# Patient Record
Sex: Male | Born: 1995 | Race: White | Hispanic: No | Marital: Single | Smoking: Never smoker
Health system: Southern US, Community
[De-identification: ages and names within clinical notes are randomized; demographics above are authoritative.]

## PROBLEM LIST (undated history)

## (undated) DIAGNOSIS — J302 Other seasonal allergic rhinitis: Secondary | ICD-10-CM

## (undated) DIAGNOSIS — R7309 Other abnormal glucose: Secondary | ICD-10-CM

## (undated) DIAGNOSIS — E119 Type 2 diabetes mellitus without complications: Secondary | ICD-10-CM

## (undated) DIAGNOSIS — J45909 Unspecified asthma, uncomplicated: Secondary | ICD-10-CM

## (undated) DIAGNOSIS — H269 Unspecified cataract: Secondary | ICD-10-CM

## (undated) HISTORY — DX: Type 2 diabetes mellitus without complications: E11.9

## (undated) HISTORY — DX: Other seasonal allergic rhinitis: J30.2

## (undated) HISTORY — PX: CATARACT EXTRACTION: SUR2

## (undated) HISTORY — DX: Unspecified cataract: H26.9

---
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
## 2015-12-28 DIAGNOSIS — H269 Unspecified cataract: Secondary | ICD-10-CM

## 2015-12-28 HISTORY — DX: Unspecified cataract: H26.9

## 2016-01-07 ENCOUNTER — Inpatient Hospital Stay (HOSPITAL_COMMUNITY): Payer: Medicaid Other

## 2016-01-07 ENCOUNTER — Encounter (HOSPITAL_COMMUNITY)
Admission: EM | Disposition: A | Discharge status: Home Health | Payer: Self-pay | Source: Home / Self Care | Attending: Pulmonary Disease

## 2016-01-07 ENCOUNTER — Encounter (HOSPITAL_COMMUNITY): Payer: Self-pay | Admitting: *Deleted

## 2016-01-07 ENCOUNTER — Emergency Department (HOSPITAL_COMMUNITY): Payer: Medicaid Other | Admitting: Certified Registered Nurse Anesthetist

## 2016-01-07 ENCOUNTER — Inpatient Hospital Stay (HOSPITAL_COMMUNITY)
Admission: EM | Admit: 2016-01-07 | Discharge: 2016-01-15 | DRG: 853 | Disposition: A | Discharge status: Home Health | Payer: Medicaid Other | Attending: Internal Medicine | Admitting: Internal Medicine

## 2016-01-07 ENCOUNTER — Emergency Department (HOSPITAL_COMMUNITY): Payer: Medicaid Other

## 2016-01-07 DIAGNOSIS — J9602 Acute respiratory failure with hypercapnia: Secondary | ICD-10-CM

## 2016-01-07 DIAGNOSIS — J45909 Unspecified asthma, uncomplicated: Secondary | ICD-10-CM | POA: Diagnosis present

## 2016-01-07 DIAGNOSIS — A419 Sepsis, unspecified organism: Secondary | ICD-10-CM | POA: Diagnosis present

## 2016-01-07 DIAGNOSIS — N493 Fournier gangrene: Secondary | ICD-10-CM | POA: Diagnosis not present

## 2016-01-07 DIAGNOSIS — E871 Hypo-osmolality and hyponatremia: Secondary | ICD-10-CM | POA: Diagnosis present

## 2016-01-07 DIAGNOSIS — Z596 Low income: Secondary | ICD-10-CM | POA: Diagnosis not present

## 2016-01-07 DIAGNOSIS — E876 Hypokalemia: Secondary | ICD-10-CM | POA: Diagnosis not present

## 2016-01-07 DIAGNOSIS — Z794 Long term (current) use of insulin: Secondary | ICD-10-CM | POA: Diagnosis not present

## 2016-01-07 DIAGNOSIS — T4275XA Adverse effect of unspecified antiepileptic and sedative-hypnotic drugs, initial encounter: Secondary | ICD-10-CM | POA: Diagnosis present

## 2016-01-07 DIAGNOSIS — E1310 Other specified diabetes mellitus with ketoacidosis without coma: Secondary | ICD-10-CM | POA: Diagnosis present

## 2016-01-07 DIAGNOSIS — E119 Type 2 diabetes mellitus without complications: Secondary | ICD-10-CM | POA: Diagnosis not present

## 2016-01-07 DIAGNOSIS — N179 Acute kidney failure, unspecified: Secondary | ICD-10-CM | POA: Diagnosis present

## 2016-01-07 DIAGNOSIS — E872 Acidosis: Secondary | ICD-10-CM

## 2016-01-07 DIAGNOSIS — D649 Anemia, unspecified: Secondary | ICD-10-CM | POA: Diagnosis not present

## 2016-01-07 DIAGNOSIS — J9600 Acute respiratory failure, unspecified whether with hypoxia or hypercapnia: Secondary | ICD-10-CM | POA: Diagnosis not present

## 2016-01-07 DIAGNOSIS — Z880 Allergy status to penicillin: Secondary | ICD-10-CM | POA: Diagnosis not present

## 2016-01-07 DIAGNOSIS — E878 Other disorders of electrolyte and fluid balance, not elsewhere classified: Secondary | ICD-10-CM | POA: Diagnosis not present

## 2016-01-07 DIAGNOSIS — E1010 Type 1 diabetes mellitus with ketoacidosis without coma: Secondary | ICD-10-CM | POA: Diagnosis not present

## 2016-01-07 DIAGNOSIS — Z4800 Encounter for change or removal of nonsurgical wound dressing: Secondary | ICD-10-CM | POA: Diagnosis present

## 2016-01-07 DIAGNOSIS — I959 Hypotension, unspecified: Secondary | ICD-10-CM | POA: Diagnosis present

## 2016-01-07 DIAGNOSIS — Z9289 Personal history of other medical treatment: Secondary | ICD-10-CM

## 2016-01-07 DIAGNOSIS — J9690 Respiratory failure, unspecified, unspecified whether with hypoxia or hypercapnia: Secondary | ICD-10-CM

## 2016-01-07 DIAGNOSIS — K611 Rectal abscess: Secondary | ICD-10-CM | POA: Diagnosis present

## 2016-01-07 DIAGNOSIS — G9340 Encephalopathy, unspecified: Secondary | ICD-10-CM | POA: Diagnosis present

## 2016-01-07 DIAGNOSIS — J9601 Acute respiratory failure with hypoxia: Secondary | ICD-10-CM | POA: Diagnosis not present

## 2016-01-07 DIAGNOSIS — Z959 Presence of cardiac and vascular implant and graft, unspecified: Secondary | ICD-10-CM | POA: Diagnosis not present

## 2016-01-07 DIAGNOSIS — B9620 Unspecified Escherichia coli [E. coli] as the cause of diseases classified elsewhere: Secondary | ICD-10-CM | POA: Diagnosis present

## 2016-01-07 DIAGNOSIS — Z9114 Patient's other noncompliance with medication regimen: Secondary | ICD-10-CM | POA: Diagnosis not present

## 2016-01-07 DIAGNOSIS — Z9889 Other specified postprocedural states: Secondary | ICD-10-CM | POA: Diagnosis not present

## 2016-01-07 DIAGNOSIS — E1165 Type 2 diabetes mellitus with hyperglycemia: Secondary | ICD-10-CM | POA: Diagnosis not present

## 2016-01-07 DIAGNOSIS — N492 Inflammatory disorders of scrotum: Secondary | ICD-10-CM | POA: Diagnosis present

## 2016-01-07 DIAGNOSIS — L02215 Cutaneous abscess of perineum: Secondary | ICD-10-CM | POA: Diagnosis present

## 2016-01-07 DIAGNOSIS — R509 Fever, unspecified: Secondary | ICD-10-CM | POA: Diagnosis present

## 2016-01-07 DIAGNOSIS — E875 Hyperkalemia: Secondary | ICD-10-CM | POA: Diagnosis not present

## 2016-01-07 DIAGNOSIS — R0689 Other abnormalities of breathing: Secondary | ICD-10-CM

## 2016-01-07 DIAGNOSIS — Z79899 Other long term (current) drug therapy: Secondary | ICD-10-CM | POA: Diagnosis not present

## 2016-01-07 HISTORY — PX: INCISION AND DRAINAGE PERIRECTAL ABSCESS: SHX1804

## 2016-01-07 LAB — CBC
HEMATOCRIT: 48 % (ref 39.0–52.0)
HEMOGLOBIN: 16.5 g/dL (ref 13.0–17.0)
MCH: 27.8 pg (ref 26.0–34.0)
MCHC: 34.4 g/dL (ref 30.0–36.0)
MCV: 80.9 fL (ref 78.0–100.0)
Platelets: 278 10*3/uL (ref 150–400)
RBC: 5.93 MIL/uL — ABNORMAL HIGH (ref 4.22–5.81)
RDW: 13.5 % (ref 11.5–15.5)
WBC: 25.1 10*3/uL — AB (ref 4.0–10.5)

## 2016-01-07 LAB — POCT I-STAT 3, ART BLOOD GAS (G3+)
ACID-BASE DEFICIT: 20 mmol/L — AB (ref 0.0–2.0)
Acid-base deficit: 12 mmol/L — ABNORMAL HIGH (ref 0.0–2.0)
Bicarbonate: 9.7 mEq/L — ABNORMAL LOW (ref 20.0–24.0)
Bicarbonate: 12.9 mEq/L — ABNORMAL LOW (ref 20.0–24.0)
O2 SAT: 99 %
O2 SAT: 100 %
PCO2 ART: 26.6 mmHg — AB (ref 35.0–45.0)
PH ART: 7.028 — AB (ref 7.350–7.450)
PO2 ART: 189 mmHg — AB (ref 80.0–100.0)
Patient temperature: 98
TCO2: 11 mmol/L (ref 0–100)
TCO2: 14 mmol/L (ref 0–100)
pCO2 arterial: 36.8 mmHg (ref 35.0–45.0)
pH, Arterial: 7.294 — ABNORMAL LOW (ref 7.350–7.450)
pO2, Arterial: 239 mmHg — ABNORMAL HIGH (ref 80.0–100.0)

## 2016-01-07 LAB — POCT I-STAT 7, (LYTES, BLD GAS, ICA,H+H)
ACID-BASE DEFICIT: 16 mmol/L — AB (ref 0.0–2.0)
ACID-BASE DEFICIT: 17 mmol/L — AB (ref 0.0–2.0)
Acid-base deficit: 19 mmol/L — ABNORMAL HIGH (ref 0.0–2.0)
Bicarbonate: 8.5 mEq/L — ABNORMAL LOW (ref 20.0–24.0)
Bicarbonate: 10.4 mEq/L — ABNORMAL LOW (ref 20.0–24.0)
Bicarbonate: 10.8 mEq/L — ABNORMAL LOW (ref 20.0–24.0)
CALCIUM ION: 1.17 mmol/L (ref 1.12–1.23)
Calcium, Ion: 1.33 mmol/L — ABNORMAL HIGH (ref 1.12–1.23)
Calcium, Ion: 1.17 mmol/L (ref 1.12–1.23)
HCT: 45 % (ref 39.0–52.0)
HCT: 36 % — ABNORMAL LOW (ref 39.0–52.0)
HEMATOCRIT: 36 % — AB (ref 39.0–52.0)
HEMOGLOBIN: 15.3 g/dL (ref 13.0–17.0)
HEMOGLOBIN: 12.2 g/dL — AB (ref 13.0–17.0)
HEMOGLOBIN: 12.2 g/dL — AB (ref 13.0–17.0)
O2 SAT: 100 %
O2 Saturation: 99 %
O2 Saturation: 100 %
PCO2 ART: 25.2 mmHg — AB (ref 35.0–45.0)
PH ART: 7.175 — AB (ref 7.350–7.450)
PO2 ART: 550 mmHg — AB (ref 80.0–100.0)
POTASSIUM: 2.5 mmol/L — AB (ref 3.5–5.1)
POTASSIUM: 2.2 mmol/L — AB (ref 3.5–5.1)
POTASSIUM: 3.3 mmol/L — AB (ref 3.5–5.1)
SODIUM: 136 mmol/L (ref 135–145)
Sodium: 133 mmol/L — ABNORMAL LOW (ref 135–145)
Sodium: 137 mmol/L (ref 135–145)
TCO2: 9 mmol/L (ref 0–100)
TCO2: 11 mmol/L (ref 0–100)
TCO2: 12 mmol/L (ref 0–100)
pCO2 arterial: 28.3 mmHg — ABNORMAL LOW (ref 35.0–45.0)
pCO2 arterial: 29.6 mmHg — ABNORMAL LOW (ref 35.0–45.0)
pH, Arterial: 7.137 — CL (ref 7.350–7.450)
pH, Arterial: 7.173 — CL (ref 7.350–7.450)
pO2, Arterial: 198 mmHg — ABNORMAL HIGH (ref 80.0–100.0)
pO2, Arterial: 463 mmHg — ABNORMAL HIGH (ref 80.0–100.0)

## 2016-01-07 LAB — BASIC METABOLIC PANEL
ANION GAP: 14 (ref 5–15)
BUN: 11 mg/dL (ref 6–20)
CO2: 11 mmol/L — ABNORMAL LOW (ref 22–32)
Calcium: 7.9 mg/dL — ABNORMAL LOW (ref 8.9–10.3)
Chloride: 109 mmol/L (ref 101–111)
Creatinine, Ser: 1.07 mg/dL (ref 0.61–1.24)
GFR calc Af Amer: >60 mL/min (ref >60)
GLUCOSE: 284 mg/dL — AB (ref 65–99)
POTASSIUM: 3.2 mmol/L — AB (ref 3.5–5.1)
Sodium: 134 mmol/L — ABNORMAL LOW (ref 135–145)

## 2016-01-07 LAB — LACTIC ACID, PLASMA
LACTIC ACID, VENOUS: 1 mmol/L (ref 0.5–2.0)
Lactic Acid, Venous: 0.7 mmol/L (ref 0.5–2.0)

## 2016-01-07 LAB — COMPREHENSIVE METABOLIC PANEL
ALT: 23 U/L (ref 17–63)
AST: 14 U/L — ABNORMAL LOW (ref 15–41)
Albumin: 3.2 g/dL — ABNORMAL LOW (ref 3.5–5.0)
Alkaline Phosphatase: 149 U/L — ABNORMAL HIGH (ref 38–126)
Anion gap: 24 — ABNORMAL HIGH (ref 5–15)
BUN: 13 mg/dL (ref 6–20)
CHLORIDE: 93 mmol/L — AB (ref 101–111)
CO2: 9 mmol/L — AB (ref 22–32)
CREATININE: 1.73 mg/dL — AB (ref 0.61–1.24)
Calcium: 9.6 mg/dL (ref 8.9–10.3)
GFR calc non Af Amer: 55 mL/min — ABNORMAL LOW (ref >60)
Glucose, Bld: 569 mg/dL (ref 65–99)
Potassium: 3.9 mmol/L (ref 3.5–5.1)
SODIUM: 126 mmol/L — AB (ref 135–145)
Total Bilirubin: 1.8 mg/dL — ABNORMAL HIGH (ref 0.3–1.2)
Total Protein: 8 g/dL (ref 6.5–8.1)

## 2016-01-07 LAB — POCT I-STAT, CHEM 8
BUN: 15 mg/dL (ref 6–20)
CHLORIDE: 107 mmol/L (ref 101–111)
Calcium, Ion: 1.27 mmol/L — ABNORMAL HIGH (ref 1.12–1.23)
Creatinine, Ser: 0.6 mg/dL — ABNORMAL LOW (ref 0.61–1.24)
Glucose, Bld: 519 mg/dL — ABNORMAL HIGH (ref 65–99)
HCT: 49 % (ref 39.0–52.0)
Hemoglobin: 16.7 g/dL (ref 13.0–17.0)
POTASSIUM: 2.5 mmol/L — AB (ref 3.5–5.1)
SODIUM: 134 mmol/L — AB (ref 135–145)
TCO2: 10 mmol/L (ref 0–100)

## 2016-01-07 LAB — TRIGLYCERIDES: TRIGLYCERIDES: 860 mg/dL — AB (ref <150)

## 2016-01-07 LAB — TROPONIN I: TROPONIN I: 0.03 ng/mL (ref <0.031)

## 2016-01-07 LAB — POCT I-STAT 4, (NA,K, GLUC, HGB,HCT)
Glucose, Bld: 349 mg/dL — ABNORMAL HIGH (ref 65–99)
HCT: 38 % — ABNORMAL LOW (ref 39.0–52.0)
HEMOGLOBIN: 12.9 g/dL — AB (ref 13.0–17.0)
Potassium: 2.4 mmol/L — CL (ref 3.5–5.1)
SODIUM: 139 mmol/L (ref 135–145)

## 2016-01-07 LAB — GLUCOSE, CAPILLARY
Glucose-Capillary: 528 mg/dL — ABNORMAL HIGH (ref 65–99)
Glucose-Capillary: 246 mg/dL — ABNORMAL HIGH (ref 65–99)
Glucose-Capillary: 252 mg/dL — ABNORMAL HIGH (ref 65–99)
Glucose-Capillary: 252 mg/dL — ABNORMAL HIGH (ref 65–99)

## 2016-01-07 LAB — I-STAT CG4 LACTIC ACID, ED: Lactic Acid, Venous: 2.88 mmol/L (ref 0.5–2.0)

## 2016-01-07 LAB — MRSA PCR SCREENING: MRSA by PCR: NEGATIVE

## 2016-01-07 SURGERY — INCISION AND DRAINAGE, ABSCESS, PERIRECTAL
Anesthesia: General | Site: Perineum

## 2016-01-07 MED ORDER — DEXTROSE-NACL 5-0.45 % IV SOLN
INTRAVENOUS | Status: DC
  Administered 2016-01-07: 19:00:00 via INTRAVENOUS
  Administered 2016-01-09: 125 mL/h via INTRAVENOUS

## 2016-01-07 MED ORDER — SODIUM CHLORIDE 0.9 % IV SOLN
1.0000 g | Freq: Three times a day (TID) | INTRAVENOUS | Status: DC
  Administered 2016-01-07: 1 g via INTRAVENOUS
  Filled 2016-01-07 (×5): qty 1

## 2016-01-07 MED ORDER — VANCOMYCIN HCL 10 G IV SOLR
1250.0000 mg | Freq: Three times a day (TID) | INTRAVENOUS | Status: DC
  Administered 2016-01-08 – 2016-01-10 (×8): 1250 mg via INTRAVENOUS
  Filled 2016-01-07 (×10): qty 1250

## 2016-01-07 MED ORDER — POTASSIUM CHLORIDE 10 MEQ/100ML IV SOLN: 10.0000 meq | INTRAVENOUS | Status: DC

## 2016-01-07 MED ORDER — PROPOFOL 10 MG/ML IV BOLUS
INTRAVENOUS | Status: DC | PRN
  Administered 2016-01-07: 150 mg via INTRAVENOUS

## 2016-01-07 MED ORDER — POTASSIUM CHLORIDE 10 MEQ/100ML IV SOLN
10.0000 meq | Freq: Once | INTRAVENOUS | Status: AC
  Administered 2016-01-07 (×2): 10 meq via INTRAVENOUS

## 2016-01-07 MED ORDER — FENTANYL CITRATE (PF) 100 MCG/2ML IJ SOLN
100.0000 ug | INTRAMUSCULAR | Status: DC | PRN
Start: 2016-01-07 — End: 2016-01-09
  Administered 2016-01-07 (×3): 100 ug via INTRAVENOUS
  Filled 2016-01-07 (×3): qty 2

## 2016-01-07 MED ORDER — SODIUM BICARBONATE 8.4 % IV SOLN
150.0000 meq | Freq: Once | INTRAVENOUS | Status: AC
  Administered 2016-01-07: 150 meq via INTRAVENOUS
  Filled 2016-01-07: qty 50

## 2016-01-07 MED ORDER — POTASSIUM CHLORIDE IN NACL 20-0.45 MEQ/L-% IV SOLN
INTRAVENOUS | Status: DC
  Administered 2016-01-07: 250 mL/h via INTRAVENOUS
  Filled 2016-01-07 (×2): qty 1000

## 2016-01-07 MED ORDER — SODIUM CHLORIDE 0.9 % IV SOLN
INTRAVENOUS | Status: DC
  Administered 2016-01-08: 5.2 [IU]/h via INTRAVENOUS
  Administered 2016-01-09: 3.9 [IU]/h via INTRAVENOUS
  Administered 2016-01-09: 4.5 [IU]/h via INTRAVENOUS
  Filled 2016-01-07 (×2): qty 2.5

## 2016-01-07 MED ORDER — SODIUM CHLORIDE 0.9 % IV SOLN: INTRAVENOUS | Status: DC

## 2016-01-07 MED ORDER — HEPARIN SODIUM (PORCINE) 5000 UNIT/ML IJ SOLN
5000.0000 [IU] | Freq: Three times a day (TID) | INTRAMUSCULAR | Status: DC
  Administered 2016-01-07 – 2016-01-15 (×23): 5000 [IU] via SUBCUTANEOUS
  Filled 2016-01-07 (×23): qty 1

## 2016-01-07 MED ORDER — PROPOFOL 500 MG/50ML IV EMUL
INTRAVENOUS | Status: DC | PRN
  Administered 2016-01-07: 75 ug/kg/min via INTRAVENOUS

## 2016-01-07 MED ORDER — SODIUM CHLORIDE 0.9 % IV BOLUS (SEPSIS)
1000.0000 mL | Freq: Once | INTRAVENOUS | Status: AC
  Administered 2016-01-07: 1000 mL via INTRAVENOUS

## 2016-01-07 MED ORDER — ARTIFICIAL TEARS OP OINT
TOPICAL_OINTMENT | OPHTHALMIC | Status: AC
  Filled 2016-01-07: qty 3.5

## 2016-01-07 MED ORDER — SUCCINYLCHOLINE CHLORIDE 20 MG/ML IJ SOLN
INTRAMUSCULAR | Status: DC | PRN
  Administered 2016-01-07: 100 mg via INTRAVENOUS

## 2016-01-07 MED ORDER — MIDAZOLAM HCL 2 MG/2ML IJ SOLN
INTRAMUSCULAR | Status: AC
  Filled 2016-01-07: qty 2

## 2016-01-07 MED ORDER — CLINDAMYCIN PHOSPHATE 900 MG/50ML IV SOLN
900.0000 mg | Freq: Three times a day (TID) | INTRAVENOUS | Status: DC
  Administered 2016-01-08 – 2016-01-11 (×10): 900 mg via INTRAVENOUS
  Filled 2016-01-07 (×11): qty 50

## 2016-01-07 MED ORDER — DEXTROSE-NACL 5-0.45 % IV SOLN: INTRAVENOUS | Status: DC

## 2016-01-07 MED ORDER — PROPOFOL 1000 MG/100ML IV EMUL
0.0000 ug/kg/min | INTRAVENOUS | Status: DC
Start: 2016-01-07 — End: 2016-01-09
  Administered 2016-01-07: 50 ug/kg/min via INTRAVENOUS
  Administered 2016-01-07: 45 ug/kg/min via INTRAVENOUS
  Administered 2016-01-08: 50 ug/kg/min via INTRAVENOUS
  Filled 2016-01-07 (×3): qty 100

## 2016-01-07 MED ORDER — IOHEXOL 300 MG/ML  SOLN
100.0000 mL | Freq: Once | INTRAMUSCULAR | Status: AC | PRN
  Administered 2016-01-07: 100 mL via INTRAVENOUS

## 2016-01-07 MED ORDER — SODIUM CHLORIDE 0.9 % IV BOLUS (SEPSIS)
1000.0000 mL | INTRAVENOUS | Status: AC
  Administered 2016-01-07: 1000 mL via INTRAVENOUS

## 2016-01-07 MED ORDER — SODIUM CHLORIDE 0.9 % IV BOLUS (SEPSIS): 500.0000 mL | INTRAVENOUS | Status: AC

## 2016-01-07 MED ORDER — SODIUM CHLORIDE 0.45 % IV SOLN
INTRAVENOUS | Status: DC | PRN
  Administered 2016-01-07: 18:00:00 via INTRAVENOUS

## 2016-01-07 MED ORDER — FENTANYL CITRATE (PF) 100 MCG/2ML IJ SOLN
INTRAMUSCULAR | Status: DC | PRN
  Administered 2016-01-07: 50 ug via INTRAVENOUS
  Administered 2016-01-07: 100 ug via INTRAVENOUS

## 2016-01-07 MED ORDER — POTASSIUM CHLORIDE 10 MEQ/100ML IV SOLN
10.0000 meq | INTRAVENOUS | Status: DC
  Administered 2016-01-07: 10 meq via INTRAVENOUS
  Filled 2016-01-07 (×3): qty 100

## 2016-01-07 MED ORDER — INSULIN REGULAR BOLUS VIA INFUSION
10.0000 [IU] | Freq: Once | INTRAVENOUS | Status: AC
  Administered 2016-01-07: 10 [IU] via INTRAVENOUS
  Filled 2016-01-07: qty 10

## 2016-01-07 MED ORDER — SODIUM CHLORIDE 0.9 % IV SOLN
500.0000 mg | Freq: Four times a day (QID) | INTRAVENOUS | Status: DC
  Administered 2016-01-07 – 2016-01-15 (×31): 500 mg via INTRAVENOUS
  Filled 2016-01-07 (×38): qty 500

## 2016-01-07 MED ORDER — MIDAZOLAM HCL 5 MG/5ML IJ SOLN
INTRAMUSCULAR | Status: DC | PRN
  Administered 2016-01-07: 2 mg via INTRAVENOUS

## 2016-01-07 MED ORDER — PANTOPRAZOLE SODIUM 40 MG IV SOLR
40.0000 mg | Freq: Every day | INTRAVENOUS | Status: DC
  Administered 2016-01-07 – 2016-01-08 (×2): 40 mg via INTRAVENOUS
  Filled 2016-01-07 (×2): qty 40

## 2016-01-07 MED ORDER — SURGILUBE EX GEL
CUTANEOUS | Status: DC | PRN
  Administered 2016-01-07: 1 via TOPICAL

## 2016-01-07 MED ORDER — POTASSIUM CHLORIDE IN NACL 20-0.9 MEQ/L-% IV SOLN
INTRAVENOUS | Status: DC
  Filled 2016-01-07: qty 1000

## 2016-01-07 MED ORDER — SODIUM CHLORIDE 0.9 % IR SOLN
Status: DC | PRN
  Administered 2016-01-07: 3000 mL

## 2016-01-07 MED ORDER — SODIUM BICARBONATE 8.4 % IV SOLN
INTRAVENOUS | Status: AC
  Filled 2016-01-07: qty 100

## 2016-01-07 MED ORDER — SODIUM CHLORIDE 0.9 % IV SOLN
INTRAVENOUS | Status: DC
Start: 2016-01-07 — End: 2016-01-08
  Administered 2016-01-07: 20:00:00 via INTRAVENOUS

## 2016-01-07 MED ORDER — FENTANYL CITRATE (PF) 250 MCG/5ML IJ SOLN
INTRAMUSCULAR | Status: AC
  Filled 2016-01-07: qty 5

## 2016-01-07 MED ORDER — CLINDAMYCIN PHOSPHATE 900 MG/50ML IV SOLN
900.0000 mg | Freq: Once | INTRAVENOUS | Status: AC
  Administered 2016-01-07: 900 mg via INTRAVENOUS
  Filled 2016-01-07: qty 50

## 2016-01-07 MED ORDER — MAGNESIUM SULFATE 2 GM/50ML IV SOLN
2.0000 g | Freq: Once | INTRAVENOUS | Status: AC
  Administered 2016-01-07: 2 g via INTRAVENOUS
  Filled 2016-01-07: qty 50

## 2016-01-07 MED ORDER — SODIUM CHLORIDE 0.9 % IV SOLN
INTRAVENOUS | Status: DC | PRN
  Administered 2016-01-07 (×4): via INTRAVENOUS

## 2016-01-07 MED ORDER — ANTISEPTIC ORAL RINSE SOLUTION (CORINZ)
7.0000 mL | OROMUCOSAL | Status: DC
  Administered 2016-01-08 – 2016-01-11 (×24): 7 mL via OROMUCOSAL

## 2016-01-07 MED ORDER — VANCOMYCIN HCL 10 G IV SOLR
1500.0000 mg | Freq: Once | INTRAVENOUS | Status: AC
  Administered 2016-01-07: 1500 mg via INTRAVENOUS
  Filled 2016-01-07: qty 1500

## 2016-01-07 MED ORDER — ONDANSETRON HCL 4 MG/2ML IJ SOLN
INTRAMUSCULAR | Status: AC
  Filled 2016-01-07: qty 2

## 2016-01-07 MED ORDER — SODIUM CHLORIDE 0.9 % IV SOLN
INTRAVENOUS | Status: DC
  Administered 2016-01-07: 8 [IU]/h via INTRAVENOUS
  Filled 2016-01-07: qty 2.5

## 2016-01-07 MED ORDER — VANCOMYCIN HCL IN DEXTROSE 1-5 GM/200ML-% IV SOLN
1000.0000 mg | Freq: Once | INTRAVENOUS | Status: AC
  Administered 2016-01-07: 1000 mg via INTRAVENOUS
  Filled 2016-01-07: qty 200

## 2016-01-07 MED ORDER — ROCURONIUM BROMIDE 100 MG/10ML IV SOLN
INTRAVENOUS | Status: DC | PRN
  Administered 2016-01-07 (×2): 50 mg via INTRAVENOUS

## 2016-01-07 MED ORDER — FENTANYL CITRATE (PF) 100 MCG/2ML IJ SOLN: 100.0000 ug | INTRAMUSCULAR | Status: DC | PRN

## 2016-01-07 MED ORDER — 0.9 % SODIUM CHLORIDE (POUR BTL) OPTIME
TOPICAL | Status: DC | PRN
  Administered 2016-01-07: 1000 mL

## 2016-01-07 MED ORDER — POTASSIUM CHLORIDE 20 MEQ/15ML (10%) PO SOLN
20.0000 meq | Freq: Once | ORAL | Status: AC
  Administered 2016-01-07: 20 meq
  Filled 2016-01-07: qty 15

## 2016-01-07 MED ORDER — POTASSIUM CHLORIDE 10 MEQ/50ML IV SOLN
10.0000 meq | INTRAVENOUS | Status: DC
  Administered 2016-01-07: 10 meq via INTRAVENOUS

## 2016-01-07 MED ORDER — ALBUTEROL SULFATE (2.5 MG/3ML) 0.083% IN NEBU: 2.5000 mg | INHALATION_SOLUTION | RESPIRATORY_TRACT | Status: DC | PRN

## 2016-01-07 MED ORDER — PROPOFOL 10 MG/ML IV BOLUS
INTRAVENOUS | Status: AC
  Filled 2016-01-07: qty 40

## 2016-01-07 SURGICAL SUPPLY — 47 items
BLADE 10 SAFETY STRL DISP (BLADE) ×3 IMPLANT
BLADE SURG 15 STRL LF DISP TIS (BLADE) ×1 IMPLANT
BLADE SURG 15 STRL SS (BLADE) ×2
BNDG GAUZE ELAST 4 BULKY (GAUZE/BANDAGES/DRESSINGS) ×3 IMPLANT
CANISTER SUCTION 2500CC (MISCELLANEOUS) ×3 IMPLANT
CLEANER TIP ELECTROSURG 2X2 (MISCELLANEOUS) ×3 IMPLANT
COVER SURGICAL LIGHT HANDLE (MISCELLANEOUS) ×3 IMPLANT
DRAPE UTILITY XL STRL (DRAPES) ×12 IMPLANT
DRSG PAD ABDOMINAL 8X10 ST (GAUZE/BANDAGES/DRESSINGS) ×3 IMPLANT
ELECT CAUTERY BLADE 6.4 (BLADE) IMPLANT
ELECT REM PT RETURN 9FT ADLT (ELECTROSURGICAL) ×3
ELECTRODE REM PT RTRN 9FT ADLT (ELECTROSURGICAL) ×1 IMPLANT
GAUZE SPONGE 4X4 12PLY STRL (GAUZE/BANDAGES/DRESSINGS) ×3 IMPLANT
GLOVE BIO SURGEON STRL SZ7.5 (GLOVE) ×3 IMPLANT
GLOVE BIOGEL PI IND STRL 6.5 (GLOVE) ×1 IMPLANT
GLOVE BIOGEL PI IND STRL 7.0 (GLOVE) ×1 IMPLANT
GLOVE BIOGEL PI IND STRL 8 (GLOVE) ×1 IMPLANT
GLOVE BIOGEL PI INDICATOR 6.5 (GLOVE) ×2
GLOVE BIOGEL PI INDICATOR 7.0 (GLOVE) ×2
GLOVE BIOGEL PI INDICATOR 8 (GLOVE) ×2
GLOVE SURG SS PI 6.5 STRL IVOR (GLOVE) ×3 IMPLANT
GOWN STRL REUS W/ TWL LRG LVL3 (GOWN DISPOSABLE) ×1 IMPLANT
GOWN STRL REUS W/ TWL XL LVL3 (GOWN DISPOSABLE) ×1 IMPLANT
GOWN STRL REUS W/TWL LRG LVL3 (GOWN DISPOSABLE) ×2
GOWN STRL REUS W/TWL XL LVL3 (GOWN DISPOSABLE) ×2
HANDPIECE INTERPULSE COAX TIP (DISPOSABLE) ×2
KIT BASIN OR (CUSTOM PROCEDURE TRAY) ×3 IMPLANT
KIT ROOM TURNOVER OR (KITS) ×3 IMPLANT
NS IRRIG 1000ML POUR BTL (IV SOLUTION) ×3 IMPLANT
PACK LITHOTOMY IV (CUSTOM PROCEDURE TRAY) ×3 IMPLANT
PAD ABD 8X10 STRL (GAUZE/BANDAGES/DRESSINGS) ×9 IMPLANT
PAD ARMBOARD 7.5X6 YLW CONV (MISCELLANEOUS) ×3 IMPLANT
PENCIL BUTTON HOLSTER BLD 10FT (ELECTRODE) ×6 IMPLANT
SET HNDPC FAN SPRY TIP SCT (DISPOSABLE) ×1 IMPLANT
SPONGE GAUZE 4X4 12PLY STER LF (GAUZE/BANDAGES/DRESSINGS) ×6 IMPLANT
SPONGE LAP 18X18 X RAY DECT (DISPOSABLE) ×6 IMPLANT
SUT VIC AB 3-0 SH 27 (SUTURE) ×2
SUT VIC AB 3-0 SH 27X BRD (SUTURE) ×1 IMPLANT
SWAB COLLECTION DEVICE MRSA (MISCELLANEOUS) ×3 IMPLANT
SYR BULB IRRIGATION 50ML (SYRINGE) ×3 IMPLANT
TOWEL OR 17X24 6PK STRL BLUE (TOWEL DISPOSABLE) ×3 IMPLANT
TOWEL OR 17X26 10 PK STRL BLUE (TOWEL DISPOSABLE) ×3 IMPLANT
TUBE ANAEROBIC SPECIMEN COL (MISCELLANEOUS) ×3 IMPLANT
TUBE CONNECTING 12'X1/4 (SUCTIONS) ×1
TUBE CONNECTING 12X1/4 (SUCTIONS) ×2 IMPLANT
UNDERPAD 30X30 INCONTINENT (UNDERPADS AND DIAPERS) ×3 IMPLANT
YANKAUER SUCT BULB TIP NO VENT (SUCTIONS) ×3 IMPLANT

## 2016-01-07 NOTE — Progress Notes (Signed)
eLink Physician-Brief Progress Note Patient Name: Shawn RoyalsCaleb M Harmon DOB: November 06, 1996 MRN: [id number]   Date of Service  01/07/2016  HPI/Events of Note  New admission. DKA and Fournier's Gangrene s/p debridement on mechanical ventilation.   eICU Interventions  Will order: 1. Insulin IV infusion protocol. 2. Ventilator orders - 50%/PRVC 18/TV 560/P 5. 3. Portable CXR now.  4. ABG now.   Weight not entered in EPIC. Not able to order Propofol IV infusion at this time.      Intervention Category Major Interventions: Respiratory failure - evaluation and management  Sommer,Steven Eugene 01/07/2016, 7:07 PM

## 2016-01-07 NOTE — H&P (Signed)
PULMONARY / CRITICAL CARE MEDICINE   Name: Shawn Harmon MRN: [id number] DOB: 08-07-1996    ADMISSION DATE:  01/07/2016 CONSULTATION DATE:  01/07/16  REFERRING MD:  EDP  CHIEF COMPLAINT:  Rectal abscess  HISTORY OF PRESENT ILLNESS:  Pt is encephelopathic; therefore, this HPI is obtained from chart review. Shawn Harmon is a 20 y.o. male with PMH as outlined below including DM to noncompliance of medications due to lack of insurance. He states he has not taken his diabetic medication in over a year because of this. He presented to [facility] ED 01/11 because of progressive enlargement of a rectal/scrotal abscess. He states that abscess began below his rectum about a week ago and has gradually progressed to the point that it is now extended into the scrotum and has developed significant pain causing difficulty to urinate and ambulate. Pain rated 10 out of 10. He has been using antibiotic ointment and has had no relief. On morning of presentation he had a fever of 101F.  In ED he was found to have a very large scrotal abscess extending into the perineum. There was concern for Fournier's gangrene therefore CT of the abdomen and pelvis was obtained.  This revealed a large volume of gas in the left scrotum that tracked into the perineum compatible with Fournier's.  He was evaluated by general surgery and was taken to the OR for emergent debridement.  In addition, he was found to be in DKA. Postoperatively he returned to the ICU on the ventilator therefore PCCM was consulted for her call management.  PAST MEDICAL HISTORY :  He  has a past medical history of Asthma; Allergy; and Diabetes mellitus without complication (HCC).  PAST SURGICAL HISTORY: He  has no past surgical history on file.  Allergies  Allergen Reactions  . Amoxicillin Itching    No current facility-administered medications on file prior to encounter.   Current Outpatient Prescriptions on File Prior to Encounter  Medication  Sig  . insulin aspart (NOVOLOG FLEXPEN) 100 UNIT/ML injection Up to 50 units daily as directed by MD  . ACCU-CHEK FASTCLIX LANCETS MISC 1 each by Does not apply route as directed. Check sugar 6 x daily  . acetone, urine, test strip Check ketones per protocol  . glucagon 1 MG injection Use for Severe Hypoglycemia . Inject 1mg  intramuscularly if unresponsive, unable to swallow, unconscious and/or has seizure  . glucose blood (ACCU-CHEK SMARTVIEW) test strip Check sugar 6 x daily  . Insulin Pen Needle (INSUPEN PEN NEEDLES) 32G X 4 MM MISC BD Pen Needles- brand specific. Inject insulin via insulin pen 6 x daily    FAMILY HISTORY:  His has no family status information on file.   SOCIAL HISTORY: He  reports that he has never smoked. He has never used smokeless tobacco. He reports that he does not drink alcohol or use illicit drugs.  REVIEW OF SYSTEMS:  Unable to obtain as patient is encephalopathic.  SUBJECTIVE:  On vent. Status post OR debridement.  VITAL SIGNS: BP 149/92 mmHg  Pulse 122  Temp(Src) 97.4 F (36.3 C) (Oral)  Resp 29  SpO2 99%  HEMODYNAMICS:    VENTILATOR SETTINGS:    INTAKE / OUTPUT:     PHYSICAL EXAMINATION: General: Young caucasian male, sedated. Neuro: Sedated, not following commands. HEENT: [place]/AT. PERRL, sclerae anicteric. Cardiovascular: RRR, no M/R/G.  Lungs: Respirations even and unlabored.  CTA bilaterally, No W/R/R. Abdomen: BS x 4, soft, NT/ND.  Musculoskeletal: No gross deformities, no edema.  Skin:  Perineal dressings C/D/I.  Skin otherwise dry and warm.    LABS:  BMET  Recent Labs Lab 01/07/16 1227  NA 126*  K 3.9  CL 93*  CO2 9*  BUN 13  CREATININE 1.73*  GLUCOSE 569*    Electrolytes  Recent Labs Lab 01/07/16 1227  CALCIUM 9.6    CBC  Recent Labs Lab 01/07/16 1227  WBC 25.1*  HGB 16.5  HCT 48.0  PLT 278    Coag's No results for input(s): APTT, INR in the last 168 hours.  Sepsis Markers  Recent Labs Lab  01/07/16 1245  LATICACIDVEN 2.88*    ABG No results for input(s): PHART, PCO2ART, PO2ART in the last 168 hours.  Liver Enzymes  Recent Labs Lab 01/07/16 1227  AST 14*  ALT 23  ALKPHOS 149*  BILITOT 1.8*  ALBUMIN 3.2*    Cardiac Enzymes No results for input(s): TROPONINI, PROBNP in the last 168 hours.  Glucose  Recent Labs Lab 01/07/16 1531  GLUCAP 528*    Imaging Ct Abdomen Pelvis W Contrast  01/07/2016  CLINICAL DATA:  Scrotal abscess for a few days. Pain. Initial encounter. EXAM: CT ABDOMEN AND PELVIS WITH CONTRAST TECHNIQUE: Multidetector CT imaging of the abdomen and pelvis was performed using the standard protocol following bolus administration of intravenous contrast. CONTRAST:  100 mL OMNIPAQUE IOHEXOL 300 MG/ML  SOLN COMPARISON:  None. FINDINGS: The lung bases are clear.  No pleural or pericardial effusion. No left kidney is identified. There is compensatory hypertrophy of the right kidney. There is mild fullness of the right renal collecting system. No stone or other obstructing lesion is identified. The spleen, adrenal glands and biliary tree normal. The pancreas is somewhat diminutive but no focal lesion is identified. Gas is seen in the left side of the scrotum tracking posteriorly into the perineum and near the anus. No fluid collection is identified in the left scrotum. There is a fluid collection right scrotum measuring approximately 3.5 cm AP by 2.6 cm transverse by 4.3 cm craniocaudal worrisome for an intrascrotal abscess. Fluid tracks posteriorly toward the anus for a perianal collection measuring 2.6 cm AP x 0.8 cm transverse is identified. No intrapelvic fluid collection is identified. The stomach, small and large bowel and appendix appear normal. No lymphadenopathy is seen. No focal bony abnormality is identified. IMPRESSION: Large volume of gas the left scrotum tracks to the perineum compatible with Fournier's gangrene. Likely intrascrotal abscess in the right  scrotum is identified with fluid tracking posteriorly toward the anus for a small perianal fluid collection is seen. Solitary right kidney. Critical Value/emergent results were called by telephone at the time of interpretation on 01/07/2016 at 3:10 pm to Dr. Blane Ohara , who verbally acknowledged these results. Electronically Signed   By: Drusilla Kanner M.D.   On: 01/07/2016 15:10     STUDIES:  CT A/P 01/11 > large volume of gas at the left scrotum that tracks into the perineum compatible with Fournier's gangrene. Likely intrascrotal abscess in the right scrotum with fluid tracking posteriorly toward the anus.  CULTURES: Blood 01/11 > Urine 01/11s > Wound 01/11 >  ANTIBIOTICS: Vanc 01/11 > Imipenem 01/11 > Clinda 01/11 >  SIGNIFICANT EVENTS: 01/11 > admitted with DKA due to Fournier's Gangrene > taken to OR for emergent debridement, planning for return trip to OR 01/12.  LINES/TUBES: ETT 01/11 >  DISCUSSION: This is a 20 year old male with a history of DM 2 with noncompliance with medication due to lack of insurance. He  presents with DKA due to Fournier's gangrene. He was taken to the OR for emergent debridement 01/11. He returned to the ICU on the ventilator. Surgery is planning to take him back to OR in AM 01/12 for further washouts.  ASSESSMENT / PLAN:  INFECTIOUS A:   Sepsis - due to Fournier's gangrene; s/p OR debridement 01/11. P:   Abx as above (vanc / imipenem/ clinda).  Follow cultures as above. Post op care per CCS.  ENDOCRINE A:   DKA. Hx DM 2 - has hx of noncompliance with medications due to lack of insurance. P:   Insulin per DKA protocol. We'll need CSW to assist with medication upon discharge. Assess Hgb A1c.  CARDIOVASCULAR A:  Sepsis - due to Fournier's gangrene; s/p OR debridement 01/11. P:  Trend lactate. Assess troponin.  PULMONARY A: VDRF - Fournier's gangrene as well as in anticipation of future OR trips for further washouts. Hx of  asthma. P:   Full vent support. Wean as able. VAP prevention measures. Hold SBT in AM in anticipation of return to OR. Albuterol PRN. CXR in AM.  RENAL A:   Hyponatremia - resolved.  Hypokalemia - currently undergoing replacement. AKI - resolved. P:   Fluids per DKA protocol. BMP q4hrs.  GASTROINTESTINAL A:   GI prophylaxis. Nutrition. P:   SUP: Pantoprazole. NPO.  HEMATOLOGIC A:   VTE Prophylaxis. P:  SCD's / heparin. CBC in AM.  NEUROLOGIC A:   Acute encephalopathy due to sedation. P:   Sedation:  Propofol gtt / fentanyl PRN. RASS goal: 0 to -1. Daily WUA.  Family updated: None.  Interdisciplinary Family Meeting v Palliative Care Meeting:  Due by: 01/17.  Rutherford Guysahul Desai, [place] Sidonie Dickens- [facility] Pager: [phone]  or [phone] 01/07/2016, 5:35 PM  Attending Note:  20 year old male presenting with AG metabolic acidosis with gap of 14 on presentation with elevated lactic acid and concern for DKA.  Patient was noted to have fournier's gangrene.  CCS saw patient and took him to the OR for debridement and anticipate will need multiple drips.  Will start DKA protocol, send to the OR with high MV ventilation upon return from the OR.  Maintain on full vent support for now.  D/C propofol and start versed drip instead and maintain fentanyl drip.  Anticipate another OR trip in AM.  On exam, lungs are clear.  Surgical site appears clean as well.  CXR I reviewed myself, ETT ok.  Will f/u in AM.  The patient is critically ill with multiple organ systems failure and requires high complexity decision making for assessment and support, frequent evaluation and titration of therapies, application of advanced monitoring technologies and extensive interpretation of multiple databases.   Critical Care Time devoted to patient care services described in this note is  35  Minutes. This time reflects time of care of this signee Dr Koren BoundWesam Yacoub. This  critical care time does not reflect procedure time, or teaching time or supervisory time of PA/NP/Med student/Med Resident etc but could involve care discussion time.  Alyson ReedyWesam G. Yacoub, M.D. [facility]. Pager: [phone]. After hours pager: [phone].

## 2016-01-07 NOTE — Progress Notes (Signed)
eLink Physician-Brief Progress Note Patient Name: Shawn RoyalsCaleb M Harmon DOB: 10-Jan-1996 MRN: [id number]   Date of Service  01/07/2016  HPI/Events of Note  ABG on 50%/PRVC 18/TV 560/P 5 = 7.028/36.8/239.0.  eICU Interventions  Will order: 1. Increase rate to 35. 2. NaHCO3 150 meq IV now.  3. Repeat ABG and Lactic Acid at 9:45 PM.     Intervention Category Major Interventions: Acid-Base disturbance - evaluation and management;Respiratory failure - evaluation and management  Veverly Larimer Dennard Nipugene 01/07/2016, 8:40 PM

## 2016-01-07 NOTE — ED Notes (Signed)
Pt reports having an abscess between rectum and testicles, had fever yesterday. Reports severe pain and HR 140.

## 2016-01-07 NOTE — Anesthesia Procedure Notes (Addendum)
Procedure Name: Intubation Date/Time: 01/07/2016 5:01 PM Performed by: Rogelia BogaMUELLER, THOMAS P Pre-anesthesia Checklist: Patient identified, Emergency Drugs available, Suction available, Patient being monitored and Timeout performed Patient Re-evaluated:Patient Re-evaluated prior to inductionOxygen Delivery Method: Circle system utilized Preoxygenation: Pre-oxygenation with 100% oxygen Intubation Type: IV induction, Rapid sequence and Cricoid Pressure applied Laryngoscope Size: Mac and 4 Grade View: Grade I Tube type: Subglottic suction tube Tube size: 7.0 mm Number of attempts: 1 Airway Equipment and Method: Stylet Placement Confirmation: ETT inserted through vocal cords under direct vision,  positive ETCO2 and breath sounds checked- equal and bilateral Secured at: 22 cm Tube secured with: Tape Dental Injury: Teeth and Oropharynx as per pre-operative assessment     Central Venous Catheter Insertion Performed by: anesthesiologist Patient location: Pre-op. Preanesthetic checklist: patient identified, IV checked, site marked, risks and benefits discussed, surgical consent, monitors and equipment checked, pre-op evaluation, timeout performed and anesthesia consent Position: Trendelenburg Lidocaine 1% used for infiltration Landmarks identified Catheter size: 8 Fr Central line was placed.Double lumen Procedure performed using ultrasound guided technique. Attempts: 1 Following insertion, dressing applied, line sutured and Biopatch. Post procedure assessment: blood return through all ports, free fluid flow and no air. Patient tolerated the procedure well with no immediate complications.

## 2016-01-07 NOTE — Progress Notes (Signed)
RT pulled ABG from pt art line, RT then informed RN of ABG results

## 2016-01-07 NOTE — Consult Note (Signed)
Shawn Harmon 06/15/1996  [id number].   Primary Care MD: Shawn Jones, PA-C Requesting MD: Dr. Joshua Harmon Chief Complaint/Reason for Consult: fournier's gangrene HPI: This is a 19 yo white male with a h/o DM who noticed a perineal abscess about a week ago.  He states that it has gradually worsened but has not drained at all.  He ran a fever of 101 last night.  He denies chills.  He denies abdominal pain, chest pain, or SOB.  He presented to the [facility] today where he was found to be tachy in the 140s and have necrosis of his scrotum.  His CBG was found to be 569.  We have been asked to see the patient for further evaluation.  ROS : Please see HPI, otherwise negative   Family History  Problem Relation Age of Onset  . Diabetes Mother   . Hypertension Mother   . Heart disease Mother   . Diabetes Father   . Arthritis Father   . Asthma Father   . Hypertension Father   . Heart disease Father   . Diabetes Sister     Past Medical History  Diagnosis Date  . Asthma   . Allergy     seasonal  . Diabetes mellitus without complication (HCC)     History reviewed. No pertinent past surgical history.  Social History:  reports that he has never smoked. He has never used smokeless tobacco. He reports that he does not drink alcohol or use illicit drugs.  Allergies:  Allergies  Allergen Reactions  . Amoxicillin Itching     (Not in a hospital admission)  Blood pressure 149/92, pulse 122, temperature 97.4 F (36.3 C), temperature source Oral, resp. rate 29, SpO2 99 %. Physical Exam: General: pleasant, WD, WN white male who is laying in bed in NAD HEENT: head is normocephalic, atraumatic.  Sclera are noninjected.  PERRL.  Ears and nose without any masses or lesions.  Mouth is pink and moist Heart: regular rhythm, but tachycardic.  Normal s1,s2. No obvious murmurs, gallops, or rubs noted.  Palpable radial and pedal pulses bilaterally Lungs: CTAB, no wheezes, rhonchi, or rales noted.   Respiratory effort nonlabored Abd: soft, NT, ND, +BS, no masses, hernias, or organomegaly MS: all 4 extremities are symmetrical with no cyanosis, clubbing, or edema.  Skin: warm and dry with no masses, lesions, or rashes.  patient has erythema and fullness of his perineum, but no drainage.  He has erythema and necrosis of the posterior aspect of his scrotum.   Psych: A&Ox3 with an appropriate affect.    Results for orders placed or performed during the hospital encounter of 01/07/16 (from the past 48 hour(s))  Comprehensive metabolic panel     Status: Abnormal   Collection Time: 01/07/16 12:27 PM  Result Value Ref Range   Sodium 126 (L) 135 - 145 mmol/L   Potassium 3.9 3.5 - 5.1 mmol/L   Chloride 93 (L) 101 - 111 mmol/L   CO2 9 (L) 22 - 32 mmol/L   Glucose, Bld 569 (HH) 65 - 99 mg/dL    Comment: CRITICAL RESULT CALLED TO, READ BACK BY AND VERIFIED WITH: JESSICA EASLEY,RN AT 1303 01/07/16 BY ZBEECH.    BUN 13 6 - 20 mg/dL   Creatinine, Ser 1.73 (H) 0.61 - 1.24 mg/dL   Calcium 9.6 8.9 - 10.3 mg/dL   Total Protein 8.0 6.5 - 8.1 g/dL   Albumin 3.2 (L) 3.5 - 5.0 g/dL   AST 14 (L) 15 - 41   U/L   ALT 23 17 - 63 U/L   Alkaline Phosphatase 149 (H) 38 - 126 U/L   Total Bilirubin 1.8 (H) 0.3 - 1.2 mg/dL   GFR calc non Af Amer 55 (L) >60 mL/min   GFR calc Af Amer >60 >60 mL/min    Comment: (NOTE) The eGFR has been calculated using the CKD EPI equation. This calculation has not been validated in all clinical situations. eGFR's persistently <60 mL/min signify possible Chronic Kidney Disease.    Anion gap 24 (H) 5 - 15  CBC     Status: Abnormal   Collection Time: 01/07/16 12:27 PM  Result Value Ref Range   WBC 25.1 (H) 4.0 - 10.5 K/uL   RBC 5.93 (H) 4.22 - 5.81 MIL/uL   Hemoglobin 16.5 13.0 - 17.0 g/dL   HCT 48.0 39.0 - 52.0 %   MCV 80.9 78.0 - 100.0 fL   MCH 27.8 26.0 - 34.0 pg   MCHC 34.4 30.0 - 36.0 g/dL   RDW 13.5 11.5 - 15.5 %   Platelets 278 150 - 400 K/uL  I-Stat CG4 Lactic  Acid, ED  (not at [facility])     Status: Abnormal   Collection Time: 01/07/16 12:45 PM  Result Value Ref Range   Lactic Acid, Venous 2.88 (HH) 0.5 - 2.0 mmol/L   Comment NOTIFIED PHYSICIAN    No results found.     Assessment/Plan 1. Fournier's gangrene -patient has necrosis of his scrotum and clearing an infection of his perineum.  We will plan on urgent surgical intervention for debridement.  This has been discussed with the patient who is agreeable to proceed.   -agree with abx regimen -plan for medicine admission given uncontrolled DM  2. DM -uncontrolled with CBG of 569, on insulin drip currently -likely in DKA given lab derangement -defer to medicine  Shawn Harmon E 01/07/2016, 3:05 PM Pager: [phone]

## 2016-01-07 NOTE — Anesthesia Preprocedure Evaluation (Signed)
Anesthesia Evaluation  Patient identified by MRN, date of birth, ID band Patient awake    Reviewed: Allergy & Precautions, NPO status , Patient's Chart, lab work & pertinent test results  History of Anesthesia Complications (+) history of anesthetic complications  Airway Mallampati: II  TM Distance: >3 FB Neck ROM: Full    Dental  (+) Poor Dentition   Pulmonary shortness of breath, asthma ,    breath sounds clear to auscultation       Cardiovascular  Rhythm:Regular Rate:Tachycardia     Neuro/Psych negative neurological ROS  negative psych ROS   GI/Hepatic   Endo/Other  diabetes, Poorly Controlled, Type 1DKA  Renal/GU      Musculoskeletal   Abdominal   Peds  Hematology negative hematology ROS (+)   Anesthesia Other Findings   Reproductive/Obstetrics                             Anesthesia Physical Anesthesia Plan  ASA: IV and emergent  Anesthesia Plan: General   Post-op Pain Management:    Induction: Intravenous  Airway Management Planned: Oral ETT  Additional Equipment: Arterial line, CVP and Ultrasound Guidance Line Placement  Intra-op Plan:   Post-operative Plan: Post-operative intubation/ventilation  Informed Consent: I have reviewed the patients History and Physical, chart, labs and discussed the procedure including the risks, benefits and alternatives for the proposed anesthesia with the patient or authorized representative who has indicated his/her understanding and acceptance.   Dental advisory given  Plan Discussed with: CRNA and Surgeon  Anesthesia Plan Comments:         Anesthesia Quick Evaluation

## 2016-01-07 NOTE — ED Notes (Signed)
Patient placed on cardiac monitoring.

## 2016-01-07 NOTE — ED Provider Notes (Signed)
CSN: [id number]     Arrival date & time 01/07/16  1202 History   First MD Initiated Contact with Patient 01/07/16 1318     Chief Complaint  Patient presents with  . Abscess  . Fever     (Consider location/radiation/quality/duration/timing/severity/associated sxs/prior Treatment) HPI   Shawn Harmon is a 20 y.o M with a pmhx of DM type 2, noncompliance with medications who presents to the emergency department today complaining of rectal abscess. Patient states over the last week he has noticed swelling below his rectum. Patient states that it has progressively gotten larger and is now in his scrotum. Patient states the swelling is now so severe that it is making it difficult for him to urinate. Pain is 10/10. Patient has been applying antibiotic ointment with no relief. Patient states that this morning he began running a fever of 101. Patient also reports nausea and vomiting but attributes this to eating less as he is trying to lose weight. Patient states that he has not taken any of his diabetic medication in over a year due to lack of insurance. Denies abdominal pain, diaphoresis, dizziness, syncope, back pain, dysuria.  Past Medical History  Diagnosis Date  . Asthma   . Allergy     seasonal  . Diabetes mellitus without complication (HCC)    History reviewed. No pertinent past surgical history. Family History  Problem Relation Age of Onset  . Diabetes Mother   . Hypertension Mother   . Heart disease Mother   . Diabetes Father   . Arthritis Father   . Asthma Father   . Hypertension Father   . Heart disease Father   . Diabetes Sister    Social History  Substance Use Topics  . Smoking status: Never Smoker   . Smokeless tobacco: Never Used  . Alcohol Use: No    Review of Systems  All other systems reviewed and are negative.     Allergies  Amoxicillin  Home Medications   Prior to Admission medications   Medication Sig Start Date End Date Taking? Authorizing Provider   ACCU-CHEK FASTCLIX LANCETS MISC 1 each by Does not apply route as directed. Check sugar 6 x daily 01/10/13   Glori Luis, MD  acetone, urine, test strip Check ketones per protocol 01/10/13   Glori Luis, MD  docusate sodium (COLACE) 100 MG capsule Take 1 capsule (100 mg total) by mouth 2 (two) times daily. 07/26/13   Vanessa Ralphs, MD  glucagon 1 MG injection Use for Severe Hypoglycemia . Inject 1mg  intramuscularly if unresponsive, unable to swallow, unconscious and/or has seizure 01/10/13   Glori Luis, MD  glucose blood (ACCU-CHEK SMARTVIEW) test strip Check sugar 6 x daily 01/10/13   Glori Luis, MD  insulin aspart (NOVOLOG FLEXPEN) 100 UNIT/ML injection Up to 50 units daily as directed by MD 01/10/13   Glori Luis, MD  Insulin Pen Needle (INSUPEN PEN NEEDLES) 32G X 4 MM MISC BD Pen Needles- brand specific. Inject insulin via insulin pen 6 x daily 01/10/13   Glori Luis, MD  metFORMIN (GLUCOPHAGE) 500 MG tablet Take 750 mg by mouth daily.    Historical Provider, MD   BP 137/91 mmHg  Pulse 147  Temp(Src) 97.4 F (36.3 C) (Oral)  Resp 20  SpO2 100% Physical Exam  Constitutional: He is oriented to person, place, and time. He appears well-developed and well-nourished. No distress.  HENT:  Head: Normocephalic and atraumatic.  Mouth/Throat: No oropharyngeal exudate.  Eyes:  Conjunctivae and EOM are normal. Pupils are equal, round, and reactive to light. Right eye exhibits no discharge. Left eye exhibits no discharge. No scleral icterus.  Cardiovascular: Regular rhythm, normal heart sounds and intact distal pulses.  Exam reveals no gallop and no friction rub.   No murmur heard. Tachycardic   Pulmonary/Chest: Effort normal and breath sounds normal. No respiratory distress. He has no wheezes. He has no rales. He exhibits no tenderness.  Abdominal: Soft. He exhibits no distension. There is no tenderness. There is no guarding.  Genitourinary:  Very large, approx 5 cm  scrotal abscess, extending to perineum. 2cm area on the scrotum that appears necrotic. Area is exquisitely tender and very erythematous. No active drainage.   Musculoskeletal: Normal range of motion. He exhibits no edema.  Neurological: He is alert and oriented to person, place, and time.  Skin: Skin is warm and dry. No rash noted. He is not diaphoretic. No erythema. No pallor.  Psychiatric: He has a normal mood and affect. His behavior is normal.  Nursing note and vitals reviewed.   ED Course  Procedures (including critical care time)  CRITICAL CARE Performed by: Dub Mikes   Total critical care time: 60 minutes  Critical care time was exclusive of separately billable procedures and treating other patients.  Critical care was necessary to treat or prevent imminent or life-threatening deterioration.  Critical care was time spent personally by me on the following activities: development of treatment plan with patient and/or surrogate as well as nursing, discussions with consultants, evaluation of patient's response to treatment, examination of patient, obtaining history from patient or surrogate, ordering and performing treatments and interventions, ordering and review of laboratory studies, ordering and review of radiographic studies, pulse oximetry and re-evaluation of patient's condition.  Labs Review Labs Reviewed  COMPREHENSIVE METABOLIC PANEL - Abnormal; Notable for the following:    Sodium 126 (*)    Chloride 93 (*)    CO2 9 (*)    Glucose, Bld 569 (*)    Creatinine, Ser 1.73 (*)    Albumin 3.2 (*)    AST 14 (*)    Alkaline Phosphatase 149 (*)    Total Bilirubin 1.8 (*)    GFR calc non Af Amer 55 (*)    Anion gap 24 (*)    All other components within normal limits  CBC - Abnormal; Notable for the following:    WBC 25.1 (*)    RBC 5.93 (*)    All other components within normal limits  I-STAT CG4 LACTIC ACID, ED - Abnormal; Notable for the following:     Lactic Acid, Venous 2.88 (*)    All other components within normal limits  CULTURE, BLOOD (ROUTINE X 2)  CULTURE, BLOOD (ROUTINE X 2)  URINE CULTURE  BLOOD GAS, ARTERIAL  URINALYSIS, ROUTINE W REFLEX MICROSCOPIC (NOT AT [facility])    Imaging Review Ct Abdomen Pelvis W Contrast  01/07/2016  CLINICAL DATA:  Scrotal abscess for a few days. Pain. Initial encounter. EXAM: CT ABDOMEN AND PELVIS WITH CONTRAST TECHNIQUE: Multidetector CT imaging of the abdomen and pelvis was performed using the standard protocol following bolus administration of intravenous contrast. CONTRAST:  100 mL OMNIPAQUE IOHEXOL 300 MG/ML  SOLN COMPARISON:  None. FINDINGS: The lung bases are clear.  No pleural or pericardial effusion. No left kidney is identified. There is compensatory hypertrophy of the right kidney. There is mild fullness of the right renal collecting system. No stone or other obstructing lesion is identified. The  spleen, adrenal glands and biliary tree normal. The pancreas is somewhat diminutive but no focal lesion is identified. Gas is seen in the left side of the scrotum tracking posteriorly into the perineum and near the anus. No fluid collection is identified in the left scrotum. There is a fluid collection right scrotum measuring approximately 3.5 cm AP by 2.6 cm transverse by 4.3 cm craniocaudal worrisome for an intrascrotal abscess. Fluid tracks posteriorly toward the anus for a perianal collection measuring 2.6 cm AP x 0.8 cm transverse is identified. No intrapelvic fluid collection is identified. The stomach, small and large bowel and appendix appear normal. No lymphadenopathy is seen. No focal bony abnormality is identified. IMPRESSION: Large volume of gas the left scrotum tracks to the perineum compatible with Fournier's gangrene. Likely intrascrotal abscess in the right scrotum is identified with fluid tracking posteriorly toward the anus for a small perianal fluid collection is seen. Solitary right kidney.  Critical Value/emergent results were called by telephone at the time of interpretation on 01/07/2016 at 3:10 pm to Dr. Blane OharaJOSHUA ZAVITZ , who verbally acknowledged these results. Electronically Signed   By: Drusilla Kannerhomas  Dalessio M.D.   On: 01/07/2016 15:10   I have personally reviewed and evaluated these images and lab results as part of my medical decision-making.   EKG Interpretation None      MDM   Final diagnoses:  Fournier's gangrene    20 y.o M with pmhx significant for DM type 2 presents for perirectal abscess. On presentation patient is tachycardic to 147. Otherwise in NAD. On exam there is a very large approximately 5 cm scrotal abscess with a 2 cm area that appears to be necrotic. Concern for Fournier's gangrene. CT abdomen pelvis ordered. Patient is afebrile. However leukocytosis is present WBC 25.1. Lactic acid is elevated at 2.88. Given tachycardia, leukocytosis, source of infection and elevated lactic acid patient meets sepsis criteria. Code sepsis was called. Blood cultures are drawn. Patient given fluid boluses and antibiotics, vanc and zosyn. Patient also appears to be in DKA. Blood glucose is 569. Anion gap is elevated at 24. Bicarbonate is 9. Patient given potassium IV. After fluid boluses were given patient initiated on an insulin drip. Dr. Jodi MourningZavitz spoke with general surgery who will consult patient in the emergency department. CT still pending. Will consult hospitalists service for admission.  After fluid bolus given. Heart rate now 122.  Patient taken to surgery for an emergency debridement. Pt taken to surgery prior to repeat CBG or repeat lactic acid was able to be collected.  Spoke with hospitalist who will resume care of patient post operatively to manage DKA.  CT results reveal large volume of gas in the left scrotum tracks to the perineum compatible with Fournier's gangrene. Likely intrascrotal abscess in the right scrotum is identified with fluid tracking posteriorly towards  the anus for a small perianal fluid collection.   Patient was discussed with and seen by Dr. Jodi MourningZavitz who agrees with the treatment plan.     Lester KinsmanSamantha Tripp Oak Trail ShoresDowless, PA-C 01/07/16 1546  Blane OharaJoshua Zavitz, MD 01/10/16 [phone]

## 2016-01-07 NOTE — Progress Notes (Signed)
ANTIBIOTIC CONSULT NOTE - INITIAL  Pharmacy Consult for meropenem Indication: Perirectal abscess  Allergies  Allergen Reactions  . Amoxicillin Itching    Patient Measurements:   Adjusted Body Weight:   Vital Signs: Temp: 97.4 F (36.3 C) (01/11 1210) Temp Source: Oral (01/11 1210) BP: 137/91 mmHg (01/11 1210) Pulse Rate: 147 (01/11 1210) Intake/Output from previous day:   Intake/Output from this shift:    Labs:  Recent Labs  01/07/16 1227  WBC 25.1*  HGB 16.5  PLT 278  CREATININE 1.73*   CrCl cannot be calculated (Unknown ideal weight.). No results for input(s): VANCOTROUGH, VANCOPEAK, VANCORANDOM, GENTTROUGH, GENTPEAK, GENTRANDOM, TOBRATROUGH, TOBRAPEAK, TOBRARND, AMIKACINPEAK, AMIKACINTROU, AMIKACIN in the last 72 hours.   Microbiology: No results found for this or any previous visit (from the past 720 hour(s)).  Medical History: Past Medical History  Diagnosis Date  . Asthma   . Allergy     seasonal  . Diabetes mellitus without complication (HCC)     Assessment: 19 yom with perirectal abscess/fever. Pharmacy consulted to dose meropenem. Afeb, WBC 25.1, LA 2.88. Given Vanc x1 in ED, Clinda ordered x1. General surgery consult pending. SCr 1.73 on admit, normalized CrCl~70.  1/11 meropenem>> 1/11 Clinda x1 1/11 Vanc x1  1/11 BCx2>> 1/11 UC>>  Goal of Therapy:  Eradication of infection  Plan:  Meropenem 1g IV q8h Monitor clinical progress, c/s, renal function, abx plan/LOT VT@SS  as indicated F/u continue vanc/clinda? F/u Surgery recs  Babs BertinHaley Brownie Gockel, PharmD, [facility] HospitalBCPS Clinical Pharmacist Pager [phone] 01/07/2016 2:59 PM

## 2016-01-07 NOTE — Progress Notes (Signed)
eLink Physician-Brief Progress Note Patient Name: Shawn Harmon DOB: 09-02-1996 MRN: [id number]   Date of Service  01/07/2016  HPI/Events of Note  Hypotension - BP 80/40 and CVP = 6.   eICU Interventions  Will bolus with 0.9 NaCl 1 liter IV over 1 hour now.      Intervention Category Major Interventions: Hypotension - evaluation and management;Shock - evaluation and management;Sepsis - evaluation and management  Tyauna Lacaze Eugene 01/07/2016, 10:50 PM

## 2016-01-07 NOTE — Transfer of Care (Signed)
Immediate Anesthesia Transfer of Care Note  Patient: Shawn Harmon  Procedure(s) Performed: Procedure(s): INCISION AND DRAINAGE OF PERINEUM (N/A)  Patient Location: SICU  Anesthesia Type:General  Level of Consciousness: sedated, unresponsive and Patient remains intubated per anesthesia plan  Airway & Oxygen Therapy: Patient remains intubated per anesthesia plan and Patient placed on Ventilator (see vital sign flow sheet for setting)  Post-op Assessment: Report given to RN and Post -op Vital signs reviewed and stable  Post vital signs: Reviewed and stable  Last Vitals:  Filed Vitals:   01/07/16 1345 01/07/16 1835  BP: 149/92 118/51  Pulse: 122 105  Temp:    Resp: 29 18    Complications: No apparent anesthesia complications

## 2016-01-07 NOTE — Op Note (Signed)
01/07/2016  6:10 PM  PATIENT:  Shawn Harmon  19 y.o. male  PRE-OPERATIVE DIAGNOSIS:  Perineal Abscess  POST-OPERATIVE DIAGNOSIS:  Fournier's Gangrene  PROCEDURE:  Procedure(s): INCISION AND DEBRIDEMENT OF PERINEUM (N/A)  SURGEON:  Surgeon(s) and Role:    * Axel FillerArmando Tanvir Hipple, MD - Primary  ANESTHESIA:   general  EBL: 100CC  Total I/O In: 2200 [I.V.:2000; IV Piggyback:200] Out: 1700 [Urine:1700]  BLOOD ADMINISTERED:none  DRAINS: none   LOCAL MEDICATIONS USED:  NONE  SPECIMEN:  Source of Specimen:  Perineal wound culture  DISPOSITION OF SPECIMEN:  Micro-  COUNTS:  YES  TOURNIQUET:  * No tourniquets in log *  DICTATION: .Dragon Dictation After the patient was consented he was taken back to the operating room and placed in the supine position with bilateral SCDs in place. Patient underwent general endotracheal anesthesia. He was in placed lithotomy position. A Foley catheter was placed. A timeout was called and all facts verified.  A 15 blade was used to make an incision over the area of greatest inflammation/fluctuance at the perineum. There is a large amount of dirty dishwater fluid expressed. The subcutaneous fat was necrotic. The debridement went posteriorly approximately 2-3 cm from the anus. I proceeded to debride the subcutaneous and skin tissue anteriorly into the left lateral side. The scrotal skin was debrided away. The testicle was able to be visualized. I proceeded to debride approximately in the left side up to approximately the inguinal crease. There was healthy fat in this area. There was no further signs of necrotic fat to the left side. I proceeded to debride of the right scrotum. The skin was necrotic and large amount of necrotic fat was visualized. I proceeded up towards the right testicle and right inguinal crease. The right peritesticular tissue did appear to have undergone liquefactive necrosis.  This was debrided back to healthy appearing subcutaneous tissue.  The middle portion of the scrotal skin did not appear infected. The area was pulse lavaged with approximately 3 L of saline. At this time elects cautery was used to obtain hemostasis. There was no further Signs of necrotic subcutaneous tissue. The wound appeared to measure approximately 15 x 20 cm.  The area was then packed with iodoform soaked Kerlix. The wound was dressed with 4 x 4's, ABD pad and mesh panties. The patient was taken to the ICU in critical condition and intubated.  PLAN OF CARE: Admit to inpatient   PATIENT DISPOSITION:  ICU - intubated and hemodynamically stable.   Delay start of Pharmacological VTE agent (>24hrs) due to surgical blood loss or risk of bleeding: not applicable

## 2016-01-07 NOTE — Progress Notes (Signed)
ANTIBIOTIC CONSULT NOTE - INITIAL  Pharmacy Consult for Vancomycin, Imipenem, Clindamycin  Indication: Fournier's gangrene   Allergies  Allergen Reactions  . Amoxicillin Itching    Patient Measurements: Height: 6' (182.9 cm) Weight: 203 lb 14.8 oz (92.5 kg) IBW/kg (Calculated) : 77.6  Vital Signs: Temp: 97.4 F (36.3 C) (01/11 1210) Temp Source: Oral (01/11 1210) BP: 125/66 mmHg (01/11 1900) Pulse Rate: 119 (01/11 1900) Intake/Output from previous day:   Intake/Output from this shift:    Labs:  Recent Labs  01/07/16 1227  01/07/16 1632 01/07/16 1730 01/07/16 1741 01/07/16 1820  WBC 25.1*  --   --   --   --   --   HGB 16.5  < > 16.7 12.2* 12.9* 12.2*  PLT 278  --   --   --   --   --   CREATININE 1.73*  --  0.60*  --   --   --   < > = values in this interval not displayed. Estimated Creatinine Clearance: 161.7 mL/min (by C-G formula based on Cr of 0.6). No results for input(s): VANCOTROUGH, VANCOPEAK, VANCORANDOM, GENTTROUGH, GENTPEAK, GENTRANDOM, TOBRATROUGH, TOBRAPEAK, TOBRARND, AMIKACINPEAK, AMIKACINTROU, AMIKACIN in the last 72 hours.   Microbiology: No results found for this or any previous visit (from the past 720 hour(s)).  Medical History: Past Medical History  Diagnosis Date  . Asthma   . Allergy     seasonal  . Diabetes mellitus without complication (HCC)     Medications:  Prescriptions prior to admission  Medication Sig Dispense Refill Last Dose  . insulin aspart (NOVOLOG FLEXPEN) 100 UNIT/ML injection Up to 50 units daily as directed by MD 5 pen 3 01/06/2016 at Unknown time  . ACCU-CHEK FASTCLIX LANCETS MISC 1 each by Does not apply route as directed. Check sugar 6 x daily 204 each 3 07/26/2013 at Unknown  . acetone, urine, test strip Check ketones per protocol 50 each 3 never used  . glucagon 1 MG injection Use for Severe Hypoglycemia . Inject 1mg  intramuscularly if unresponsive, unable to swallow, unconscious and/or has seizure 2 each 3 unk  .  glucose blood (ACCU-CHEK SMARTVIEW) test strip Check sugar 6 x daily 200 each 3 07/26/2013 at Unknown  . Insulin Pen Needle (INSUPEN PEN NEEDLES) 32G X 4 MM MISC BD Pen Needles- brand specific. Inject insulin via insulin pen 6 x daily 200 each 3 07/26/2013 at Unknown   Assessment: 19 yom with perirectal abscess/fever. Pharmacy consulted to dose meropenem. Afeb, WBC 25.1, LA 2.88. Given Vanc 1 gm x1 in ED, Clinda ordered x1. Currently also on meropenem. General surgery consult pending. SCr 1.73 on admit now down to 0.6.   1/11 meropenem x 1 dose  1/11 Clinda >> 1/11 Vanc >> 1/11 Imipenem>>   1/11 BCx2>> 1/11 UC>>  Goal of Therapy:  Vancomycin trough level 15-20 mcg/ml  Plan:  -Stop meropenem. Start Imipenem 500 mg IV Q 6 hours  -Clindamycin 900 mg IV Q 8 hours -Give additional Vancomycin 1500 mg IV once followed by Vancomycin 1250 mg IV Q 8 hours  -Monitor CBC, renal fx, cultures and clinical progress -[facility]   Vinnie LevelBenjamin Leaann Nevils, PharmD., BCPS Clinical Pharmacist Pager [phone]

## 2016-01-08 ENCOUNTER — Inpatient Hospital Stay (HOSPITAL_COMMUNITY): Payer: Medicaid Other

## 2016-01-08 ENCOUNTER — Encounter (HOSPITAL_COMMUNITY)
Admission: EM | Disposition: A | Discharge status: Home Health | Payer: Self-pay | Source: Home / Self Care | Attending: Pulmonary Disease

## 2016-01-08 ENCOUNTER — Inpatient Hospital Stay (HOSPITAL_COMMUNITY): Payer: Medicaid Other | Admitting: Certified Registered Nurse Anesthetist

## 2016-01-08 ENCOUNTER — Encounter (HOSPITAL_COMMUNITY): Payer: Self-pay | Admitting: Certified Registered Nurse Anesthetist

## 2016-01-08 DIAGNOSIS — N493 Fournier gangrene: Secondary | ICD-10-CM | POA: Diagnosis present

## 2016-01-08 DIAGNOSIS — J9601 Acute respiratory failure with hypoxia: Secondary | ICD-10-CM

## 2016-01-08 HISTORY — PX: IRRIGATION AND DEBRIDEMENT ABSCESS: SHX5252

## 2016-01-08 LAB — CBC
HEMATOCRIT: 31.3 % — AB (ref 39.0–52.0)
HEMOGLOBIN: 10.8 g/dL — AB (ref 13.0–17.0)
MCH: 27 pg (ref 26.0–34.0)
MCHC: 34.5 g/dL (ref 30.0–36.0)
MCV: 78.3 fL (ref 78.0–100.0)
Platelets: 188 10*3/uL (ref 150–400)
RBC: 4 MIL/uL — ABNORMAL LOW (ref 4.22–5.81)
RDW: 13.5 % (ref 11.5–15.5)
WBC: 13.4 10*3/uL — ABNORMAL HIGH (ref 4.0–10.5)

## 2016-01-08 LAB — GLUCOSE, CAPILLARY
GLUCOSE-CAPILLARY: 129 mg/dL — AB (ref 65–99)
GLUCOSE-CAPILLARY: 131 mg/dL — AB (ref 65–99)
GLUCOSE-CAPILLARY: 136 mg/dL — AB (ref 65–99)
GLUCOSE-CAPILLARY: 145 mg/dL — AB (ref 65–99)
GLUCOSE-CAPILLARY: 146 mg/dL — AB (ref 65–99)
GLUCOSE-CAPILLARY: 161 mg/dL — AB (ref 65–99)
GLUCOSE-CAPILLARY: 201 mg/dL — AB (ref 65–99)
GLUCOSE-CAPILLARY: 211 mg/dL — AB (ref 65–99)
Glucose-Capillary: 105 mg/dL — ABNORMAL HIGH (ref 65–99)
Glucose-Capillary: 115 mg/dL — ABNORMAL HIGH (ref 65–99)
Glucose-Capillary: 116 mg/dL — ABNORMAL HIGH (ref 65–99)
Glucose-Capillary: 120 mg/dL — ABNORMAL HIGH (ref 65–99)
Glucose-Capillary: 123 mg/dL — ABNORMAL HIGH (ref 65–99)
Glucose-Capillary: 127 mg/dL — ABNORMAL HIGH (ref 65–99)
Glucose-Capillary: 129 mg/dL — ABNORMAL HIGH (ref 65–99)
Glucose-Capillary: 141 mg/dL — ABNORMAL HIGH (ref 65–99)
Glucose-Capillary: 143 mg/dL — ABNORMAL HIGH (ref 65–99)
Glucose-Capillary: 157 mg/dL — ABNORMAL HIGH (ref 65–99)
Glucose-Capillary: 178 mg/dL — ABNORMAL HIGH (ref 65–99)
Glucose-Capillary: 195 mg/dL — ABNORMAL HIGH (ref 65–99)
Glucose-Capillary: 203 mg/dL — ABNORMAL HIGH (ref 65–99)
Glucose-Capillary: 240 mg/dL — ABNORMAL HIGH (ref 65–99)

## 2016-01-08 LAB — POCT I-STAT 3, ART BLOOD GAS (G3+)
Acid-base deficit: 10 mmol/L — ABNORMAL HIGH (ref 0.0–2.0)
Acid-base deficit: 12 mmol/L — ABNORMAL HIGH (ref 0.0–2.0)
BICARBONATE: 14.6 meq/L — AB (ref 20.0–24.0)
BICARBONATE: 12.5 meq/L — AB (ref 20.0–24.0)
O2 Saturation: 100 %
O2 Saturation: 99 %
PCO2 ART: 28.7 mmHg — AB (ref 35.0–45.0)
PCO2 ART: 24.3 mmHg — AB (ref 35.0–45.0)
PH ART: 7.319 — AB (ref 7.350–7.450)
PH ART: 7.32 — AB (ref 7.350–7.450)
Patient temperature: 98.9
TCO2: 15 mmol/L (ref 0–100)
TCO2: 13 mmol/L (ref 0–100)
pO2, Arterial: 204 mmHg — ABNORMAL HIGH (ref 80.0–100.0)
pO2, Arterial: 154 mmHg — ABNORMAL HIGH (ref 80.0–100.0)

## 2016-01-08 LAB — BASIC METABOLIC PANEL
Anion gap: 10 (ref 5–15)
Anion gap: 10 (ref 5–15)
Anion gap: 11 (ref 5–15)
Anion gap: 11 (ref 5–15)
Anion gap: 9 (ref 5–15)
BUN: 14 mg/dL (ref 6–20)
BUN: 16 mg/dL (ref 6–20)
BUN: 11 mg/dL (ref 6–20)
BUN: 11 mg/dL (ref 6–20)
BUN: 12 mg/dL (ref 6–20)
CALCIUM: 7.5 mg/dL — AB (ref 8.9–10.3)
CHLORIDE: 117 mmol/L — AB (ref 101–111)
CHLORIDE: 117 mmol/L — AB (ref 101–111)
CO2: 11 mmol/L — AB (ref 22–32)
CO2: 14 mmol/L — AB (ref 22–32)
CO2: 14 mmol/L — AB (ref 22–32)
CO2: 14 mmol/L — AB (ref 22–32)
CO2: 14 mmol/L — ABNORMAL LOW (ref 22–32)
Calcium: 7.6 mg/dL — ABNORMAL LOW (ref 8.9–10.3)
Calcium: 7.9 mg/dL — ABNORMAL LOW (ref 8.9–10.3)
Calcium: 8.1 mg/dL — ABNORMAL LOW (ref 8.9–10.3)
Calcium: 8 mg/dL — ABNORMAL LOW (ref 8.9–10.3)
Chloride: 114 mmol/L — ABNORMAL HIGH (ref 101–111)
Chloride: 115 mmol/L — ABNORMAL HIGH (ref 101–111)
Chloride: 115 mmol/L — ABNORMAL HIGH (ref 101–111)
Creatinine, Ser: 0.79 mg/dL (ref 0.61–1.24)
Creatinine, Ser: 0.81 mg/dL (ref 0.61–1.24)
Creatinine, Ser: 0.81 mg/dL (ref 0.61–1.24)
Creatinine, Ser: 0.86 mg/dL (ref 0.61–1.24)
Creatinine, Ser: 0.93 mg/dL (ref 0.61–1.24)
GFR calc Af Amer: >60 mL/min (ref >60)
GFR calc Af Amer: >60 mL/min (ref >60)
GFR calc Af Amer: >60 mL/min (ref >60)
GFR calc Af Amer: >60 mL/min (ref >60)
GFR calc Af Amer: >60 mL/min (ref >60)
GFR calc non Af Amer: >60 mL/min (ref >60)
GLUCOSE: 162 mg/dL — AB (ref 65–99)
GLUCOSE: 95 mg/dL (ref 65–99)
GLUCOSE: 178 mg/dL — AB (ref 65–99)
GLUCOSE: 240 mg/dL — AB (ref 65–99)
GLUCOSE: 241 mg/dL — AB (ref 65–99)
POTASSIUM: 2.8 mmol/L — AB (ref 3.5–5.1)
POTASSIUM: 3.2 mmol/L — AB (ref 3.5–5.1)
POTASSIUM: 2.5 mmol/L — AB (ref 3.5–5.1)
POTASSIUM: 2.5 mmol/L — AB (ref 3.5–5.1)
Potassium: 2.6 mmol/L — CL (ref 3.5–5.1)
Sodium: 138 mmol/L (ref 135–145)
Sodium: 139 mmol/L (ref 135–145)
Sodium: 139 mmol/L (ref 135–145)
Sodium: 140 mmol/L (ref 135–145)
Sodium: 140 mmol/L (ref 135–145)

## 2016-01-08 LAB — LACTIC ACID, PLASMA
LACTIC ACID, VENOUS: 1.1 mmol/L (ref 0.5–2.0)
Lactic Acid, Venous: 1.1 mmol/L (ref 0.5–2.0)

## 2016-01-08 LAB — PHOSPHORUS: Phosphorus: <1 mg/dL — CL (ref 2.5–4.6)

## 2016-01-08 LAB — URINALYSIS, ROUTINE W REFLEX MICROSCOPIC
Glucose, UA: 100 mg/dL — AB
Hgb urine dipstick: NEGATIVE
KETONES UR: 15 mg/dL — AB
NITRITE: NEGATIVE
PH: 6.5 (ref 5.0–8.0)
PROTEIN: 100 mg/dL — AB
Specific Gravity, Urine: 1.031 — ABNORMAL HIGH (ref 1.005–1.030)

## 2016-01-08 LAB — URINE MICROSCOPIC-ADD ON

## 2016-01-08 LAB — MAGNESIUM: MAGNESIUM: 2 mg/dL (ref 1.7–2.4)

## 2016-01-08 LAB — BETA-HYDROXYBUTYRIC ACID: Beta-Hydroxybutyric Acid: 3.92 mmol/L — ABNORMAL HIGH (ref 0.05–0.27)

## 2016-01-08 LAB — POTASSIUM: POTASSIUM: 2.7 mmol/L — AB (ref 3.5–5.1)

## 2016-01-08 SURGERY — IRRIGATION AND DEBRIDEMENT ABSCESS
Anesthesia: General | Site: Groin | Laterality: Bilateral

## 2016-01-08 MED ORDER — MIDAZOLAM HCL 2 MG/2ML IJ SOLN
INTRAMUSCULAR | Status: AC
  Filled 2016-01-08: qty 2

## 2016-01-08 MED ORDER — POTASSIUM CHLORIDE 10 MEQ/50ML IV SOLN
10.0000 meq | INTRAVENOUS | Status: AC
Start: 2016-01-08 — End: 2016-01-08
  Administered 2016-01-08 (×3): 10 meq via INTRAVENOUS
  Filled 2016-01-08: qty 50

## 2016-01-08 MED ORDER — PHENYLEPHRINE HCL 10 MG/ML IJ SOLN
10.0000 mg | INTRAVENOUS | Status: DC | PRN
  Administered 2016-01-08: 20 ug/min via INTRAVENOUS

## 2016-01-08 MED ORDER — PROPOFOL 10 MG/ML IV BOLUS
INTRAVENOUS | Status: AC
  Filled 2016-01-08: qty 20

## 2016-01-08 MED ORDER — POTASSIUM CHLORIDE 10 MEQ/50ML IV SOLN
10.0000 meq | INTRAVENOUS | Status: AC
  Administered 2016-01-08 – 2016-01-09 (×4): 10 meq via INTRAVENOUS
  Filled 2016-01-08 (×4): qty 50

## 2016-01-08 MED ORDER — FENTANYL CITRATE (PF) 100 MCG/2ML IJ SOLN
INTRAMUSCULAR | Status: DC | PRN
  Administered 2016-01-08 (×2): 100 ug via INTRAVENOUS
  Administered 2016-01-08: 50 ug via INTRAVENOUS
  Administered 2016-01-08: 100 ug via INTRAVENOUS

## 2016-01-08 MED ORDER — SODIUM CHLORIDE 0.9 % IV SOLN
25.0000 ug/h | INTRAVENOUS | Status: DC
  Administered 2016-01-08: 25 ug/h via INTRAVENOUS
  Administered 2016-01-08: 175 ug/h via INTRAVENOUS
  Filled 2016-01-08 (×3): qty 50

## 2016-01-08 MED ORDER — ROCURONIUM BROMIDE 100 MG/10ML IV SOLN
INTRAVENOUS | Status: DC | PRN
  Administered 2016-01-08: 30 mg via INTRAVENOUS
  Administered 2016-01-08: 50 mg via INTRAVENOUS

## 2016-01-08 MED ORDER — MIDAZOLAM HCL 5 MG/5ML IJ SOLN
INTRAMUSCULAR | Status: DC | PRN
  Administered 2016-01-08: 2 mg via INTRAVENOUS

## 2016-01-08 MED ORDER — ACETAMINOPHEN 160 MG/5ML PO SOLN
500.0000 mg | Freq: Four times a day (QID) | ORAL | Status: DC | PRN
  Administered 2016-01-08 – 2016-01-09 (×2): 500 mg
  Filled 2016-01-08 (×2): qty 20.3

## 2016-01-08 MED ORDER — FENTANYL CITRATE (PF) 250 MCG/5ML IJ SOLN
INTRAMUSCULAR | Status: AC
  Filled 2016-01-08: qty 5

## 2016-01-08 MED ORDER — INFLUENZA VAC SPLIT QUAD 0.5 ML IM SUSY: 0.5000 mL | PREFILLED_SYRINGE | INTRAMUSCULAR | Status: DC | PRN

## 2016-01-08 MED ORDER — POTASSIUM PHOSPHATES 15 MMOLE/5ML IV SOLN
30.0000 mmol | Freq: Once | INTRAVENOUS | Status: AC
  Administered 2016-01-08: 30 mmol via INTRAVENOUS
  Filled 2016-01-08: qty 10

## 2016-01-08 MED ORDER — LACTATED RINGERS IV SOLN
INTRAVENOUS | Status: DC | PRN
  Administered 2016-01-08: 13:00:00 via INTRAVENOUS

## 2016-01-08 MED ORDER — SODIUM CHLORIDE 0.9 % IR SOLN
Status: DC | PRN
  Administered 2016-01-08: 3000 mL

## 2016-01-08 MED ORDER — POTASSIUM CHLORIDE 10 MEQ/50ML IV SOLN
10.0000 meq | INTRAVENOUS | Status: AC
  Administered 2016-01-08 (×4): 10 meq via INTRAVENOUS
  Filled 2016-01-08 (×4): qty 50

## 2016-01-08 MED ORDER — ROCURONIUM BROMIDE 50 MG/5ML IV SOLN
INTRAVENOUS | Status: AC
  Filled 2016-01-08: qty 1

## 2016-01-08 MED ORDER — 0.9 % SODIUM CHLORIDE (POUR BTL) OPTIME
TOPICAL | Status: DC | PRN
  Administered 2016-01-08: 1000 mL

## 2016-01-08 MED ORDER — MIDAZOLAM HCL 5 MG/ML IJ SOLN
1.0000 mg/h | INTRAMUSCULAR | Status: DC
  Administered 2016-01-08: 1 mg/h via INTRAVENOUS
  Filled 2016-01-08 (×2): qty 10

## 2016-01-08 MED ORDER — PNEUMOCOCCAL VAC POLYVALENT 25 MCG/0.5ML IJ INJ: 0.5000 mL | INJECTION | INTRAMUSCULAR | Status: DC | PRN

## 2016-01-08 MED ORDER — POTASSIUM CHLORIDE 10 MEQ/50ML IV SOLN
10.0000 meq | INTRAVENOUS | Status: AC
  Administered 2016-01-08 (×3): 10 meq via INTRAVENOUS
  Filled 2016-01-08: qty 50

## 2016-01-08 MED ORDER — POTASSIUM CHLORIDE CRYS ER 20 MEQ PO TBCR
40.0000 meq | EXTENDED_RELEASE_TABLET | Freq: Once | ORAL | Status: AC
  Administered 2016-01-08: 40 meq via ORAL
  Filled 2016-01-08: qty 2

## 2016-01-08 MED ORDER — ONDANSETRON HCL 4 MG/2ML IJ SOLN: INTRAMUSCULAR | Status: DC | PRN

## 2016-01-08 SURGICAL SUPPLY — 31 items
BNDG GAUZE ELAST 4 BULKY (GAUZE/BANDAGES/DRESSINGS) ×3 IMPLANT
CANISTER SUCTION 2500CC (MISCELLANEOUS) ×3 IMPLANT
COVER SURGICAL LIGHT HANDLE (MISCELLANEOUS) ×3 IMPLANT
DRAPE LAPAROSCOPIC ABDOMINAL (DRAPES) ×3 IMPLANT
DRAPE PROXIMA HALF (DRAPES) ×3 IMPLANT
DRSG PAD ABDOMINAL 8X10 ST (GAUZE/BANDAGES/DRESSINGS) ×3 IMPLANT
ELECT CAUTERY BLADE 6.4 (BLADE) ×3 IMPLANT
ELECT REM PT RETURN 9FT ADLT (ELECTROSURGICAL) ×3
ELECTRODE REM PT RTRN 9FT ADLT (ELECTROSURGICAL) ×1 IMPLANT
GLOVE BIO SURGEON STRL SZ 6 (GLOVE) ×3 IMPLANT
GLOVE BIO SURGEON STRL SZ7 (GLOVE) ×3 IMPLANT
GLOVE BIO SURGEON STRL SZ7.5 (GLOVE) ×3 IMPLANT
GLOVE BIOGEL PI IND STRL 7.0 (GLOVE) ×1 IMPLANT
GLOVE BIOGEL PI IND STRL 8 (GLOVE) ×1 IMPLANT
GLOVE BIOGEL PI INDICATOR 7.0 (GLOVE) ×2
GLOVE BIOGEL PI INDICATOR 8 (GLOVE) ×2
GOWN STRL REUS W/ TWL LRG LVL3 (GOWN DISPOSABLE) ×1 IMPLANT
GOWN STRL REUS W/ TWL XL LVL3 (GOWN DISPOSABLE) ×1 IMPLANT
GOWN STRL REUS W/TWL LRG LVL3 (GOWN DISPOSABLE) ×2
GOWN STRL REUS W/TWL XL LVL3 (GOWN DISPOSABLE) ×2
HANDPIECE INTERPULSE COAX TIP (DISPOSABLE) ×2
KIT BASIN OR (CUSTOM PROCEDURE TRAY) ×3 IMPLANT
KIT ROOM TURNOVER OR (KITS) ×3 IMPLANT
LEGGING LITHOTOMY PAIR STRL (DRAPES) ×3 IMPLANT
NS IRRIG 1000ML POUR BTL (IV SOLUTION) ×3 IMPLANT
PACK GENERAL/GYN (CUSTOM PROCEDURE TRAY) ×3 IMPLANT
PAD ARMBOARD 7.5X6 YLW CONV (MISCELLANEOUS) ×3 IMPLANT
SET HNDPC FAN SPRY TIP SCT (DISPOSABLE) ×1 IMPLANT
SUT VIC AB 3-0 SH 27 (SUTURE) ×2
SUT VIC AB 3-0 SH 27X BRD (SUTURE) ×1 IMPLANT
TOWEL OR 17X24 6PK STRL BLUE (TOWEL DISPOSABLE) ×3 IMPLANT

## 2016-01-08 NOTE — Transfer of Care (Signed)
Immediate Anesthesia Transfer of Care Note  Patient: Shawn Harmon  Procedure(s) Performed: Procedure(s): IRRIGATION AND DEBRIDEMENT GROIN (Bilateral)  Patient Location: SICU  Anesthesia Type:General  Level of Consciousness: Patient remains intubated per anesthesia plan  Airway & Oxygen Therapy: Patient remains intubated per anesthesia plan and Patient placed on Ventilator (see vital sign flow sheet for setting)  Post-op Assessment: Report given to RN and Post -op Vital signs reviewed and stable  Post vital signs: Reviewed and stable  Last Vitals:  Filed Vitals:   01/08/16 1200 01/08/16 1415  BP: 93/37   Pulse: 83 83  Temp:    Resp: 35 35    Complications: No apparent anesthesia complications

## 2016-01-08 NOTE — Op Note (Signed)
01/08/2016  1:53 PM  PATIENT:  Shawn Harmon  20 y.o. male  PRE-OPERATIVE DIAGNOSIS:  Fournier's Gangrene  POST-OPERATIVE DIAGNOSIS:  Fournier's Gangrene  PROCEDURE:  Procedure(s): IRRIGATION AND DEBRIDEMENT GROIN (Bilateral)  SURGEON:  Surgeon(s) and Role:    * Axel FillerArmando Kenedi Cilia, MD - Primary   ANESTHESIA:   general  EBL:  Total I/O In: 2352.9 [I.V.:1552.9; IV Piggyback:800] Out: 175 [Urine:175]  BLOOD ADMINISTERED:none  DRAINS: none   LOCAL MEDICATIONS USED:  NONE  SPECIMEN:  No Specimen  DISPOSITION OF SPECIMEN:  N/A  COUNTS:  YES  TOURNIQUET:  * No tourniquets in log *  DICTATION: .Dragon Dictation  After the patient was consented he was taken back to the operating room and placedin the supine position with bilateral SCDs inplace. General endotracheal anesthesia was in place and started.  The patient was placed in lithotomy position.He was prepped and draped in the usual sterile fashion. A timeout was called andI went-year-old year under control All facts verified.  The wound was examined. The most anterior inferior portion of the scrotal skin did appear infected and this was trimmed away. Both inguinal canals/inguinal creases were examined without any further ischemic tissue. At this time a pulse lavage was used to lavage the wound.electrocautery was used to maintain hemostasis The wound was packed with iodoform soaked gauze. The patient tolerated procedure well was taken to the ICU in stable condition and intubated.   1. Tool used for debridement (curette, scapel, etc.)  bovie  3.  Frequency of surgical debridement.   Second debridement  4.  Measurement of total devitalized tissue (wound surface) before and after surgical debridement.  15 x 20cm 5.  Area and depth of devitalized tissue removed from wound.   15 x 25 x 5 cm  6.  Blood loss and description of tissue removed.  Approximately 10 mL, gangrenous scrotal skin  7.  Evidence of the progress of the wound's  response to treatment.  A.  Current wound volume (current dimensions and depth).  15 x 25 x 5 cm  B.  Presence (and extent of) of infection.  Excised  C.  Presence (and extent of) of non viable tissue.  none  D.  Other material in the wound that is expected to inhibit healing.  None  8.  Was there any viable tissue removed (measurements): no  PLAN OF CARE: Admit to inpatient   PATIENT DISPOSITION:  ICU - intubated and hemodynamically stable.   Delay start of Pharmacological VTE agent (>24hrs) due to surgical blood loss or risk of bleeding: no

## 2016-01-08 NOTE — Progress Notes (Signed)
eLink Physician-Brief Progress Note Patient Name: Shawn RoyalsCaleb M Harmon DOB: [birth year] MRN: [id number]   Date of Service  01/08/2016  HPI/Events of Note  Phos<1  eICU Interventions  replaced     Intervention Category Major Interventions: Electrolyte abnormality - evaluation and management  Shawn Harmon 01/08/2016, 1:46 AM

## 2016-01-08 NOTE — Progress Notes (Signed)
1 Day Post-Op  Subjective: Overnight events noted  Objective: Vital signs in last 24 hours: Temp:  [97.4 F (36.3 C)-102.2 F (39 C)] 101.9 F (38.8 C) (01/12 0721) Pulse Rate:  [90-147] 92 (01/12 0700) Resp:  [18-35] 35 (01/12 0700) BP: (95-149)/(38-92) 95/42 mmHg (01/12 0700) SpO2:  [94 %-100 %] 94 % (01/12 0700) Arterial Line BP: (70-135)/(37-70) 70/37 mmHg (01/12 0700) FiO2 (%):  [40 %-50 %] 40 % (01/12 0318) Weight:  [92.5 kg (203 lb 14.8 oz)] 92.5 kg (203 lb 14.8 oz) (01/11 1900)    Intake/Output from previous day: 01/11 0701 - 01/12 0700 In: 9513.9 [I.V.:7043.9; NG/GT:30; IV Piggyback:2440] Out: 3410 [Urine:3050; Emesis/NG output:300; Blood:60] Intake/Output this shift:    Gen: Sedated Heart: RRR Pulm: CTAB on vent Abd: soft, nttp, ND Wound: Packed with guaze Lab Results:   Recent Labs  01/07/16 1227  01/07/16 1820 01/08/16 0050  WBC 25.1*  --   --  13.4*  HGB 16.5  < > 12.2* 10.8*  HCT 48.0  < > 36.0* 31.3*  PLT 278  --   --  188  < > = values in this interval not displayed. BMET  Recent Labs  01/08/16 0050 01/08/16 0430  NA 139 139  K 2.5* 2.5*  CL 114* 115*  CO2 14* 14*  GLUCOSE 240* 178*  BUN 11 12  CREATININE 0.86 0.79  CALCIUM 7.6* 7.9*   PT/INR No results for input(s): LABPROT, INR in the last 72 hours. ABG  Recent Labs  01/07/16 2158 01/08/16 0442  PHART 7.294* 7.319*  HCO3 12.9* 14.6*    Studies/Results: Ct Abdomen Pelvis W Contrast  01/07/2016  CLINICAL DATA:  Scrotal abscess for a few days. Pain. Initial encounter. EXAM: CT ABDOMEN AND PELVIS WITH CONTRAST TECHNIQUE: Multidetector CT imaging of the abdomen and pelvis was performed using the standard protocol following bolus administration of intravenous contrast. CONTRAST:  100 mL OMNIPAQUE IOHEXOL 300 MG/ML  SOLN COMPARISON:  None. FINDINGS: The lung bases are clear.  No pleural or pericardial effusion. No left kidney is identified. There is compensatory hypertrophy of the  right kidney. There is mild fullness of the right renal collecting system. No stone or other obstructing lesion is identified. The spleen, adrenal glands and biliary tree normal. The pancreas is somewhat diminutive but no focal lesion is identified. Gas is seen in the left side of the scrotum tracking posteriorly into the perineum and near the anus. No fluid collection is identified in the left scrotum. There is a fluid collection right scrotum measuring approximately 3.5 cm AP by 2.6 cm transverse by 4.3 cm craniocaudal worrisome for an intrascrotal abscess. Fluid tracks posteriorly toward the anus for a perianal collection measuring 2.6 cm AP x 0.8 cm transverse is identified. No intrapelvic fluid collection is identified. The stomach, small and large bowel and appendix appear normal. No lymphadenopathy is seen. No focal bony abnormality is identified. IMPRESSION: Large volume of gas the left scrotum tracks to the perineum compatible with Fournier's gangrene. Likely intrascrotal abscess in the right scrotum is identified with fluid tracking posteriorly toward the anus for a small perianal fluid collection is seen. Solitary right kidney. Critical Value/emergent results were called by telephone at the time of interpretation on 01/07/2016 at 3:10 pm to Dr. Blane OharaJOSHUA ZAVITZ , who verbally acknowledged these results. Electronically Signed   By: Drusilla Kannerhomas  Dalessio M.D.   On: 01/07/2016 15:10   Portable Chest Xray  01/08/2016  CLINICAL DATA:  Ventilator dependent respiratory failure EXAM: PORTABLE CHEST  1 VIEW COMPARISON:  Portable chest x-ray of November 06, 2016. FINDINGS: The lungs are well-expanded and clear. The heart and pulmonary vascularity are normal. There is no pleural effusion or pneumothorax. The endotracheal tube tip projects approximately 5.8 cm above the carina. The esophagogastric tube has its proximal port above the level of the GE junction. The right internal jugular venous catheter tip projects over the  midportion of the SVC. IMPRESSION: 1. There is no active cardiopulmonary disease. 2. The esophagogastric tube needs to be advanced approximately 10 cm to assure that the proximal port remains below the GE junction. 3. The endotracheal tube and right internal jugular venous catheter are in reasonable position. Electronically Signed   By: David  [place] M.D.   On: 01/08/2016 07:27   Dg Chest Port 1 View  01/07/2016  CLINICAL DATA:  Acute respiratory failure. On ventilator. Controlled insulin dependent diabetes. Obesity. EXAM: PORTABLE CHEST 1 VIEW COMPARISON:  None. FINDINGS: Endotracheal tube is seen in place with tip approximately 4 cm above the carina. Right jugular central venous catheter is seen with tip in the distal SVC. Orogastric tube tip is seen in the proximal stomach. Patient is rotated to the right. There is no evidence of pulmonary consolidation or edema. No evidence pleural effusion. Heart size is within normal limits. IMPRESSION: Support lines and tubes in appropriate position. No active lung disease. Electronically Signed   By: Myles Rosenthal M.D.   On: 01/07/2016 20:03    Anti-infectives: Anti-infectives    Start     Dose/Rate Route Frequency Ordered Stop   01/08/16 0400  vancomycin (VANCOCIN) 1,250 mg in sodium chloride 0.9 % 250 mL IVPB     1,250 mg 166.7 mL/hr over 90 Minutes Intravenous Every 8 hours 01/07/16 1923     01/08/16 0100  clindamycin (CLEOCIN) IVPB 900 mg     900 mg 100 mL/hr over 30 Minutes Intravenous Every 8 hours 01/07/16 1923     01/07/16 1930  vancomycin (VANCOCIN) 1,500 mg in sodium chloride 0.9 % 500 mL IVPB     1,500 mg 250 mL/hr over 120 Minutes Intravenous  Once 01/07/16 1923 01/07/16 2230   01/07/16 1930  imipenem-cilastatin (PRIMAXIN) 500 mg in sodium chloride 0.9 % 100 mL IVPB     500 mg 200 mL/hr over 30 Minutes Intravenous Every 6 hours 01/07/16 1923     01/07/16 1515  meropenem (MERREM) 1 g in sodium chloride 0.9 % 100 mL IVPB  Status:  Discontinued      1 g 200 mL/hr over 30 Minutes Intravenous 3 times per day 01/07/16 1502 01/07/16 1923   01/07/16 1345  vancomycin (VANCOCIN) IVPB 1000 mg/200 mL premix     1,000 mg 200 mL/hr over 60 Minutes Intravenous  Once 01/07/16 1343 01/07/16 1518   01/07/16 1345  clindamycin (CLEOCIN) IVPB 900 mg     900 mg 100 mL/hr over 30 Minutes Intravenous  Once 01/07/16 1343 01/07/16 1708      Assessment/Plan: s/p Procedure(s): INCISION AND DRAINAGE OF PERINEUM (N/A) -Will need reexploration later today to see if any further debridement needed.  Will plan for after noon time -con't abx -Micro pending -appreciate CCM assistance   LOS: 1 day    Marigene Ehlers., Jed Limerick 01/08/2016

## 2016-01-08 NOTE — Progress Notes (Signed)
eLink Physician-Brief Progress Note Patient Name: Shawn RoyalsCaleb M Clucas DOB: Jul 18, 1996 MRN: [id number]   Date of Service  01/08/2016  HPI/Events of Note  K+ = 3.2 and Creatinine = 0.81.  eICU Interventions  Will replete K+.     Intervention Category Intermediate Interventions: Electrolyte abnormality - evaluation and management  Haydyn Girvan Eugene 01/08/2016, 8:54 PM

## 2016-01-08 NOTE — Anesthesia Postprocedure Evaluation (Signed)
Anesthesia Post Note  Patient: Shawn RoyalsCaleb M Venezia  Procedure(s) Performed: Procedure(s) (LRB): IRRIGATION AND DEBRIDEMENT GROIN (Bilateral)  Patient location during evaluation: ICU Anesthesia Type: General Level of consciousness: patient remains intubated per anesthesia plan Pain management: pain level controlled Vital Signs Assessment: vitals unstable Respiratory status: patient remains intubated per anesthesia plan Cardiovascular status: unstable Anesthetic complications: no     Akeyla Molden A.

## 2016-01-08 NOTE — Progress Notes (Signed)
eLink Physician-Brief Progress Note Patient Name: Shawn RoyalsCaleb M Harmon DOB: 10/28/1996 MRN: [id number]   Date of Service  01/08/2016  HPI/Events of Note  K=2.6  eICU Interventions  40meq KCLlvia OGT and 40meq KCl via CVL     Intervention Category Major Interventions: Electrolyte abnormality - evaluation and management  Daphnie Venturini 01/08/2016, 12:34 AM

## 2016-01-08 NOTE — Anesthesia Preprocedure Evaluation (Signed)
Anesthesia Evaluation  Patient identified by MRN, date of birth, ID band Patient awake    Reviewed: Allergy & Precautions, NPO status , Patient's Chart, lab work & pertinent test results  Airway Mallampati: Intubated       Dental   Pulmonary    Pulmonary exam normal        Cardiovascular hypertension, Normal cardiovascular exam     Neuro/Psych    GI/Hepatic   Endo/Other  diabetes, Poorly Controlled, Type 2, Insulin Dependent  Renal/GU      Musculoskeletal   Abdominal   Peds  Hematology   Anesthesia Other Findings   Reproductive/Obstetrics                             Anesthesia Physical Anesthesia Plan  ASA: III and emergent  Anesthesia Plan: General   Post-op Pain Management:    Induction: Intravenous  Airway Management Planned: Oral ETT  Additional Equipment:   Intra-op Plan:   Post-operative Plan: Post-operative intubation/ventilation  Informed Consent: I have reviewed the patients History and Physical, chart, labs and discussed the procedure including the risks, benefits and alternatives for the proposed anesthesia with the patient or authorized representative who has indicated his/her understanding and acceptance.     Plan Discussed with: CRNA and Surgeon  Anesthesia Plan Comments:         Anesthesia Quick Evaluation

## 2016-01-08 NOTE — Progress Notes (Signed)
eLink Physician-Brief Progress Note Patient Name: Shawn RoyalsCaleb M Harmon DOB: [birth year] MRN: [id number]   Date of Service  01/08/2016  HPI/Events of Note  K=2.5 after receiving 80meq KCL (40 PO and 40 IV)  eICU Interventions  Recheck stat K     Intervention Category Major Interventions: Electrolyte abnormality - evaluation and management  De Libman 01/08/2016, 6:28 AM

## 2016-01-08 NOTE — Progress Notes (Signed)
CRITICAL VALUE ALERT  Critical value received:  K 2.7  Date of notification:  01/07/15  Time of notification:  0800  Critical value read back:Yes.    Nurse who received alert:  Lynetta MareHeather Stefanny Pieri, RN  MD notified (1st page):  Derrell Lollingamirez  Time of first page:  0800  MD notified (2nd page):  Time of second page:  Responding MD:  Derrell Lollingamirez  Time MD responded:  0800  Order to give 3 runs of K

## 2016-01-08 NOTE — Progress Notes (Signed)
PULMONARY / CRITICAL CARE MEDICINE   Name: Shawn Harmon MRN: [id number] DOB: [birth year]    ADMISSION DATE:  01/07/2016 CONSULTATION DATE:  01/07/16  REFERRING MD:  EDP  CHIEF COMPLAINT:  Rectal abscess  HISTORY OF PRESENT ILLNESS:  Pt is encephelopathic; therefore, this HPI is obtained from chart review. Shawn Harmon is a 20 y.o. male with PMH as outlined below including DM to noncompliance of medications due to lack of insurance. He states he has not taken his diabetic medication in over a year because of this. He presented to [facility] ED 01/11 because of progressive enlargement of a rectal/scrotal abscess. He states that abscess began below his rectum about a week ago and has gradually progressed to the point that it is now extended into the scrotum and has developed significant pain causing difficulty to urinate and ambulate. Pain rated 10 out of 10. He has been using antibiotic ointment and has had no relief. On morning of presentation he had a fever of 101F.  In ED he was found to have a very large scrotal abscess extending into the perineum. There was concern for Fournier's gangrene therefore CT of the abdomen and pelvis was obtained.  This revealed a large volume of gas in the left scrotum that tracked into the perineum compatible with Fournier's.  He was evaluated by general surgery and was taken to the OR for emergent debridement.  In addition, he was found to be in DKA. Postoperatively he returned to the ICU on the ventilator therefore PCCM was consulted for her call management.   SUBJECTIVE:  On vent.    VITAL SIGNS: BP 93/37 mmHg  Pulse 83  Temp(Src) 98.9 F (37.2 C) (Oral)  Resp 35  Ht 6' (1.829 m)  Wt 92.5 kg (203 lb 14.8 oz)  BMI 27.65 kg/m2  SpO2 98%  HEMODYNAMICS: CVP:  [6 mmHg] 6 mmHg  VENTILATOR SETTINGS: Vent Mode:  [-] PRVC FiO2 (%):  [30 %-50 %] 30 % Set Rate:  [18 bmp-35 bmp] 35 bmp Vt Set:  [560 mL] 560 mL PEEP:  [5 cmH20] 5 cmH20 Plateau  Pressure:  [16 cmH20-20 cmH20] 20 cmH20  INTAKE / OUTPUT: I/O last 3 completed shifts: In: 9513.9 [I.V.:7043.9; NG/GT:30; IV Piggyback:2440] Out: 3410 [Urine:3050; Emesis/NG output:300; Blood:60]   PHYSICAL EXAMINATION: General: Young caucasian male, sedated. Neuro: Sedated, not following commands. HEENT: [place]/AT. PERRL, sclerae anicteric. Cardiovascular: RRR, no M/R/G.  Lungs: Respirations even and unlabored.  CTA bilaterally, No W/R/R. Abdomen: BS x 4, soft, NT/ND.  Musculoskeletal: No gross deformities, no edema.  Skin: Perineal dressings C/D/I.  Skin otherwise dry and warm.    LABS:  BMET  Recent Labs Lab 01/07/16 2345 01/08/16 0050 01/08/16 0430 01/08/16 0635  NA 140 139 139  --   K 2.6* 2.5* 2.5* 2.7*  CL 115* 114* 115*  --   CO2 14* 14* 14*  --   BUN 11 11 12   --   CREATININE 0.93 0.86 0.79  --   GLUCOSE 241* 240* 178*  --     Electrolytes  Recent Labs Lab 01/07/16 2345 01/08/16 0050 01/08/16 0430  CALCIUM 7.5* 7.6* 7.9*  MG  --  2.0  --   PHOS  --  <1.0*  --     CBC  Recent Labs Lab 01/07/16 1227  01/07/16 1741 01/07/16 1820 01/08/16 0050  WBC 25.1*  --   --   --  13.4*  HGB 16.5  < > 12.9* 12.2* 10.8*  HCT 48.0  < >  38.0* 36.0* 31.3*  PLT 278  --   --   --  188  < > = values in this interval not displayed.  Coag's No results for input(s): APTT, INR in the last 168 hours.  Sepsis Markers  Recent Labs Lab 01/07/16 2200 01/08/16 0050 01/08/16 0430  LATICACIDVEN 1.0 1.1 1.1    ABG  Recent Labs Lab 01/07/16 2000 01/07/16 2158 01/08/16 0442  PHART 7.028* 7.294* 7.319*  PCO2ART 36.8 26.6* 28.7*  PO2ART 239.0* 189.0* 204.0*    Liver Enzymes  Recent Labs Lab 01/07/16 1227  AST 14*  ALT 23  ALKPHOS 149*  BILITOT 1.8*  ALBUMIN 3.2*    Cardiac Enzymes  Recent Labs Lab 01/07/16 2055  TROPONINI 0.03    Glucose  Recent Labs Lab 01/08/16 0210 01/08/16 0315 01/08/16 0432 01/08/16 0535 01/08/16 0634  01/08/16 0717  GLUCAP 195* 201* 178* 145* 116* 105*    Imaging Ct Abdomen Pelvis W Contrast  01/07/2016  CLINICAL DATA:  Scrotal abscess for a few days. Pain. Initial encounter. EXAM: CT ABDOMEN AND PELVIS WITH CONTRAST TECHNIQUE: Multidetector CT imaging of the abdomen and pelvis was performed using the standard protocol following bolus administration of intravenous contrast. CONTRAST:  100 mL OMNIPAQUE IOHEXOL 300 MG/ML  SOLN COMPARISON:  None. FINDINGS: The lung bases are clear.  No pleural or pericardial effusion. No left kidney is identified. There is compensatory hypertrophy of the right kidney. There is mild fullness of the right renal collecting system. No stone or other obstructing lesion is identified. The spleen, adrenal glands and biliary tree normal. The pancreas is somewhat diminutive but no focal lesion is identified. Gas is seen in the left side of the scrotum tracking posteriorly into the perineum and near the anus. No fluid collection is identified in the left scrotum. There is a fluid collection right scrotum measuring approximately 3.5 cm AP by 2.6 cm transverse by 4.3 cm craniocaudal worrisome for an intrascrotal abscess. Fluid tracks posteriorly toward the anus for a perianal collection measuring 2.6 cm AP x 0.8 cm transverse is identified. No intrapelvic fluid collection is identified. The stomach, small and large bowel and appendix appear normal. No lymphadenopathy is seen. No focal bony abnormality is identified. IMPRESSION: Large volume of gas the left scrotum tracks to the perineum compatible with Fournier's gangrene. Likely intrascrotal abscess in the right scrotum is identified with fluid tracking posteriorly toward the anus for a small perianal fluid collection is seen. Solitary right kidney. Critical Value/emergent results were called by telephone at the time of interpretation on 01/07/2016 at 3:10 pm to Dr. Blane Ohara , who verbally acknowledged these results. Electronically  Signed   By: Drusilla Kanner M.D.   On: 01/07/2016 15:10   Portable Chest Xray  01/08/2016  CLINICAL DATA:  Ventilator dependent respiratory failure EXAM: PORTABLE CHEST 1 VIEW COMPARISON:  Portable chest x-ray of November 06, 2016. FINDINGS: The lungs are well-expanded and clear. The heart and pulmonary vascularity are normal. There is no pleural effusion or pneumothorax. The endotracheal tube tip projects approximately 5.8 cm above the carina. The esophagogastric tube has its proximal port above the level of the GE junction. The right internal jugular venous catheter tip projects over the midportion of the SVC. IMPRESSION: 1. There is no active cardiopulmonary disease. 2. The esophagogastric tube needs to be advanced approximately 10 cm to assure that the proximal port remains below the GE junction. 3. The endotracheal tube and right internal jugular venous catheter are in reasonable position. Electronically  Signed   By: David  SwazilandJordan M.D.   On: 01/08/2016 07:27   Dg Chest Port 1 View  01/07/2016  CLINICAL DATA:  Acute respiratory failure. On ventilator. Controlled insulin dependent diabetes. Obesity. EXAM: PORTABLE CHEST 1 VIEW COMPARISON:  None. FINDINGS: Endotracheal tube is seen in place with tip approximately 4 cm above the carina. Right jugular central venous catheter is seen with tip in the distal SVC. Orogastric tube tip is seen in the proximal stomach. Patient is rotated to the right. There is no evidence of pulmonary consolidation or edema. No evidence pleural effusion. Heart size is within normal limits. IMPRESSION: Support lines and tubes in appropriate position. No active lung disease. Electronically Signed   By: Myles RosenthalJohn  Stahl M.D.   On: 01/07/2016 20:03     STUDIES:  CT A/P 01/11 > large volume of gas at the left scrotum that tracks into the perineum compatible with Fournier's gangrene. Likely intrascrotal abscess in the right scrotum with fluid tracking posteriorly toward the  anus.  CULTURES: Blood 01/11 > Urine 01/11s > Wound 01/11 >  ANTIBIOTICS: Vanc 01/11 > Imipenem 01/11 > Clinda 01/11 >  SIGNIFICANT EVENTS: 01/11 > admitted with DKA due to Fournier's Gangrene > taken to OR for emergent debridement, planning for return trip to OR 01/12.  LINES/TUBES: ETT 01/11 >  DISCUSSION: This is a [age] year old male with a history of DM 2 with noncompliance with medication due to lack of insurance. He presents with DKA due to Fournier's gangrene. He was taken to the OR for emergent debridement 01/11. He returned to the ICU on the ventilator. Surgery is planning to take him back to OR in AM 01/12 for further washouts.  ASSESSMENT / PLAN:  INFECTIOUS A:   Sepsis - due to Fournier's gangrene; s/p OR debridement 01/11. P:   Abx as above (vanc / imipenem/ clinda).  Follow cultures as above. Post op care per CCS. Suspect back to OR 1/12 for further debridement  ENDOCRINE A:   DKA. Hx DM 2 - has hx of noncompliance with medications due to lack of insurance. P:   Insulin per DKA protocol. d5 0.45 NS running We'll need CSW to assist with medication upon discharge. Assess Hgb A1c.  CARDIOVASCULAR A:  Sepsis - due to Fournier's gangrene; s/p OR debridement 01/11. P:  Trend lactate. Assess troponin.  PULMONARY A: VDRF - Fournier's gangrene as well as in anticipation of future OR trips for further washouts. Hx of asthma. P:   Full vent support. Wean as able. No plan extubation at this time since he is going back to OR 1/12 VAP prevention measures. Albuterol PRN. CXR in AM.  RENAL A:   Hyponatremia - resolved.  Hypokalemia -  AKI - resolved. P:   Fluids per DKA protocol. BMP q6hrs. Additional K replacement ordered 1/12 pm  GASTROINTESTINAL A:   GI prophylaxis. Nutrition. P:   SUP: Pantoprazole. NPO.  HEMATOLOGIC A:   VTE Prophylaxis. P:  SCD's / heparin. CBC in AM.  NEUROLOGIC A:   Acute encephalopathy due to sedation. P:    Sedation:  Propofol gtt / fentanyl PRN. RASS goal: 0 to -1. Daily WUA.  Family updated: None.  Interdisciplinary Family Meeting v Palliative Care Meeting:  Due by: 01/17.  Independent CC time 35 minutes  Levy Pupaobert Byrum, MD, PhD 01/08/2016, 12:16 PM [facility] [phone] or if no answer [phone]

## 2016-01-08 NOTE — Progress Notes (Signed)
CRITICAL VALUE ALERT  Critical value received:  K+ 2.6  Date of notification:  01/08/16  Time of notification:  12330  Critical value read back: Yes  Nurse who received alert:  Aviva SignsJessica Merica Prell  MD notified (1st page):  Mungal  Time of first page:  1235  MD notified (2nd page):  Time of second page:  Responding MD:  Dema SeverinMungal  Time MD responded:  1235

## 2016-01-08 NOTE — Anesthesia Postprocedure Evaluation (Signed)
Anesthesia Post Note  Patient: Shawn Harmon  Procedure(s) Performed: Procedure(s) (LRB): INCISION AND DRAINAGE OF PERINEUM (N/A)  Patient location during evaluation: ICU Anesthesia Type: General Level of consciousness: sedated Pain management: pain level controlled Vital Signs Assessment: post-procedure vital signs reviewed and stable Respiratory status: patient remains intubated per anesthesia plan Cardiovascular status: stable Postop Assessment: no signs of nausea or vomiting Anesthetic complications: no    Last Vitals:  Filed Vitals:   01/08/16 2000 01/08/16 2100  BP: 90/58 94/49  Pulse: 83 82  Temp:    Resp: 0 28    Last Pain:  Filed Vitals:   01/08/16 2113  PainSc: 9                  Encarnacion Scioneaux

## 2016-01-09 ENCOUNTER — Inpatient Hospital Stay (HOSPITAL_COMMUNITY): Payer: Medicaid Other

## 2016-01-09 ENCOUNTER — Encounter (HOSPITAL_COMMUNITY): Payer: Self-pay | Admitting: General Surgery

## 2016-01-09 LAB — BASIC METABOLIC PANEL
ANION GAP: 8 (ref 5–15)
ANION GAP: 7 (ref 5–15)
Anion gap: 9 (ref 5–15)
BUN: 18 mg/dL (ref 6–20)
BUN: 17 mg/dL (ref 6–20)
BUN: 15 mg/dL (ref 6–20)
CALCIUM: 8.2 mg/dL — AB (ref 8.9–10.3)
CALCIUM: 8 mg/dL — AB (ref 8.9–10.3)
CO2: 12 mmol/L — ABNORMAL LOW (ref 22–32)
CO2: 13 mmol/L — ABNORMAL LOW (ref 22–32)
CO2: 14 mmol/L — ABNORMAL LOW (ref 22–32)
Calcium: 8.1 mg/dL — ABNORMAL LOW (ref 8.9–10.3)
Chloride: 117 mmol/L — ABNORMAL HIGH (ref 101–111)
Chloride: 117 mmol/L — ABNORMAL HIGH (ref 101–111)
Chloride: 117 mmol/L — ABNORMAL HIGH (ref 101–111)
Creatinine, Ser: 0.88 mg/dL (ref 0.61–1.24)
Creatinine, Ser: 0.8 mg/dL (ref 0.61–1.24)
Creatinine, Ser: 0.76 mg/dL (ref 0.61–1.24)
GFR calc Af Amer: >60 mL/min (ref >60)
GFR calc non Af Amer: >60 mL/min (ref >60)
GLUCOSE: 216 mg/dL — AB (ref 65–99)
Glucose, Bld: 208 mg/dL — ABNORMAL HIGH (ref 65–99)
Glucose, Bld: 222 mg/dL — ABNORMAL HIGH (ref 65–99)
POTASSIUM: 3.5 mmol/L (ref 3.5–5.1)
POTASSIUM: 3.4 mmol/L — AB (ref 3.5–5.1)
Potassium: 3.7 mmol/L (ref 3.5–5.1)
SODIUM: 138 mmol/L (ref 135–145)
SODIUM: 138 mmol/L (ref 135–145)
Sodium: 138 mmol/L (ref 135–145)

## 2016-01-09 LAB — POCT I-STAT 4, (NA,K, GLUC, HGB,HCT)
Glucose, Bld: 102 mg/dL — ABNORMAL HIGH (ref 65–99)
HEMATOCRIT: 32 % — AB (ref 39.0–52.0)
HEMOGLOBIN: 10.9 g/dL — AB (ref 13.0–17.0)
POTASSIUM: 2.8 mmol/L — AB (ref 3.5–5.1)
SODIUM: 143 mmol/L (ref 135–145)

## 2016-01-09 LAB — PHOSPHORUS: Phosphorus: <1 mg/dL — CL (ref 2.5–4.6)

## 2016-01-09 LAB — GLUCOSE, CAPILLARY
GLUCOSE-CAPILLARY: 132 mg/dL — AB (ref 65–99)
GLUCOSE-CAPILLARY: 140 mg/dL — AB (ref 65–99)
GLUCOSE-CAPILLARY: 145 mg/dL — AB (ref 65–99)
GLUCOSE-CAPILLARY: 146 mg/dL — AB (ref 65–99)
GLUCOSE-CAPILLARY: 149 mg/dL — AB (ref 65–99)
GLUCOSE-CAPILLARY: 154 mg/dL — AB (ref 65–99)
GLUCOSE-CAPILLARY: 161 mg/dL — AB (ref 65–99)
GLUCOSE-CAPILLARY: 178 mg/dL — AB (ref 65–99)
GLUCOSE-CAPILLARY: 179 mg/dL — AB (ref 65–99)
GLUCOSE-CAPILLARY: 183 mg/dL — AB (ref 65–99)
GLUCOSE-CAPILLARY: 202 mg/dL — AB (ref 65–99)
GLUCOSE-CAPILLARY: 205 mg/dL — AB (ref 65–99)
GLUCOSE-CAPILLARY: 234 mg/dL — AB (ref 65–99)
Glucose-Capillary: 177 mg/dL — ABNORMAL HIGH (ref 65–99)
Glucose-Capillary: 170 mg/dL — ABNORMAL HIGH (ref 65–99)
Glucose-Capillary: 173 mg/dL — ABNORMAL HIGH (ref 65–99)
Glucose-Capillary: 192 mg/dL — ABNORMAL HIGH (ref 65–99)
Glucose-Capillary: 227 mg/dL — ABNORMAL HIGH (ref 65–99)
Glucose-Capillary: 117 mg/dL — ABNORMAL HIGH (ref 65–99)
Glucose-Capillary: 136 mg/dL — ABNORMAL HIGH (ref 65–99)
Glucose-Capillary: 164 mg/dL — ABNORMAL HIGH (ref 65–99)
Glucose-Capillary: 143 mg/dL — ABNORMAL HIGH (ref 65–99)

## 2016-01-09 LAB — CBC
HEMATOCRIT: 30.5 % — AB (ref 39.0–52.0)
Hemoglobin: 10.6 g/dL — ABNORMAL LOW (ref 13.0–17.0)
MCH: 27.1 pg (ref 26.0–34.0)
MCHC: 34.8 g/dL (ref 30.0–36.0)
MCV: 78 fL (ref 78.0–100.0)
Platelets: 234 10*3/uL (ref 150–400)
RBC: 3.91 MIL/uL — ABNORMAL LOW (ref 4.22–5.81)
RDW: 14.3 % (ref 11.5–15.5)
WBC: 15.1 10*3/uL — ABNORMAL HIGH (ref 4.0–10.5)

## 2016-01-09 LAB — POCT I-STAT 7, (LYTES, BLD GAS, ICA,H+H)
ACID-BASE DEFICIT: 12 mmol/L — AB (ref 0.0–2.0)
Acid-base deficit: 12 mmol/L — ABNORMAL HIGH (ref 0.0–2.0)
Bicarbonate: 15.6 mEq/L — ABNORMAL LOW (ref 20.0–24.0)
Bicarbonate: 15.7 mEq/L — ABNORMAL LOW (ref 20.0–24.0)
Calcium, Ion: 1.3 mmol/L — ABNORMAL HIGH (ref 1.12–1.23)
Calcium, Ion: 1.3 mmol/L — ABNORMAL HIGH (ref 1.12–1.23)
HEMATOCRIT: 30 % — AB (ref 39.0–52.0)
HEMATOCRIT: 34 % — AB (ref 39.0–52.0)
HEMOGLOBIN: 10.2 g/dL — AB (ref 13.0–17.0)
HEMOGLOBIN: 11.6 g/dL — AB (ref 13.0–17.0)
O2 SAT: 100 %
O2 SAT: 100 %
PCO2 ART: 43.7 mmHg (ref 35.0–45.0)
PH ART: 7.182 — AB (ref 7.350–7.450)
PO2 ART: 412 mmHg — AB (ref 80.0–100.0)
PO2 ART: 431 mmHg — AB (ref 80.0–100.0)
POTASSIUM: 2.8 mmol/L — AB (ref 3.5–5.1)
POTASSIUM: 2.8 mmol/L — AB (ref 3.5–5.1)
Patient temperature: 37.3
Sodium: 141 mmol/L (ref 135–145)
Sodium: 142 mmol/L (ref 135–145)
TCO2: 17 mmol/L (ref 0–100)
TCO2: 17 mmol/L (ref 0–100)
pCO2 arterial: 41.8 mmHg (ref 35.0–45.0)
pH, Arterial: 7.164 — CL (ref 7.350–7.450)

## 2016-01-09 LAB — BLOOD GAS, ARTERIAL
Acid-base deficit: 13 mmol/L — ABNORMAL HIGH (ref 0.0–2.0)
Bicarbonate: 11.1 mEq/L — ABNORMAL LOW (ref 20.0–24.0)
Drawn by: 418751
FIO2: 0.3
MECHVT: 560 mL
O2 Saturation: 99.7 %
PEEP: 5 cmH2O
Patient temperature: 98.6
RATE: 35 resp/min
TCO2: 11.7 mmol/L (ref 0–100)
pCO2 arterial: 18.5 mmHg — CL (ref 35.0–45.0)
pH, Arterial: 7.395 (ref 7.350–7.450)
pO2, Arterial: 175 mmHg — ABNORMAL HIGH (ref 80.0–100.0)

## 2016-01-09 LAB — HEMOGLOBIN A1C
Hgb A1c MFr Bld: 15.5 % — ABNORMAL HIGH (ref 4.8–5.6)
MEAN PLASMA GLUCOSE: 398 mg/dL

## 2016-01-09 LAB — URINE CULTURE: Culture: NO GROWTH

## 2016-01-09 LAB — MAGNESIUM: Magnesium: 1.9 mg/dL (ref 1.7–2.4)

## 2016-01-09 MED ORDER — POTASSIUM PHOSPHATES 15 MMOLE/5ML IV SOLN
30.0000 mmol | Freq: Once | INTRAVENOUS | Status: AC
  Administered 2016-01-09: 30 mmol via INTRAVENOUS
  Filled 2016-01-09: qty 10

## 2016-01-09 MED ORDER — LACTATED RINGERS IV SOLN: INTRAVENOUS | Status: DC

## 2016-01-09 MED ORDER — LACTATED RINGERS IV SOLN
INTRAVENOUS | Status: DC
  Administered 2016-01-09: 100 mL/h via INTRAVENOUS

## 2016-01-09 MED ORDER — FENTANYL CITRATE (PF) 100 MCG/2ML IJ SOLN
50.0000 ug | INTRAMUSCULAR | Status: DC | PRN
  Administered 2016-01-09 – 2016-01-15 (×17): 50 ug via INTRAVENOUS
  Filled 2016-01-09 (×17): qty 2

## 2016-01-09 MED ORDER — DEXTROSE 10 % IV SOLN: INTRAVENOUS | Status: DC | PRN

## 2016-01-09 MED ORDER — DEXTROSE IN LACTATED RINGERS 5 % IV SOLN: INTRAVENOUS | Status: DC

## 2016-01-09 NOTE — Progress Notes (Signed)
PULMONARY / CRITICAL CARE MEDICINE   Name: Shawn Harmon MRN: [id number] DOB: 1996/10/26    ADMISSION DATE:  01/07/2016 CONSULTATION DATE:  01/07/16  REFERRING MD:  EDP  CHIEF COMPLAINT:  Rectal abscess  HISTORY OF PRESENT ILLNESS 19 yom admitted w/ DKA in setting of Fournier's Gangrene  SUBJECTIVE:  On vent. No distress.    VITAL SIGNS: BP 110/66 mmHg  Pulse 94  Temp(Src) 100.8 F (38.2 C) (Oral)  Resp 35  Ht 6' (1.829 m)  Wt 98.6 kg (217 lb 6 oz)  BMI 29.47 kg/m2  SpO2 99%  HEMODYNAMICS: CVP:  [6 mmHg-7 mmHg] 7 mmHg  VENTILATOR SETTINGS: Vent Mode:  [-] PRVC FiO2 (%):  [30 %] 30 % Set Rate:  [35 bmp] 35 bmp Vt Set:  [560 mL] 560 mL PEEP:  [5 cmH20] 5 cmH20 Plateau Pressure:  [20 cmH20-22 cmH20] 20 cmH20  INTAKE / OUTPUT: I/O last 3 completed shifts: In: 12888.7 [I.V.:8538.7; NG/GT:60; IV Piggyback:4290] Out: 2675 [Urine:2375; Emesis/NG output:300]   PHYSICAL EXAMINATION: General: Young caucasian male, sedated on vent but f/c Neuro: Sedated, appropriate w/ no focal def  HEENT: [place]/AT. PERRL, sclerae anicteric. Cardiovascular: RRR, no M/R/G.  Lungs: Respirations even and unlabored.  CTA bilaterally, No W/R/R. Abdomen: BS x 4, soft, NT/ND.  Musculoskeletal: No gross deformities, no edema.  Skin: Perineal dressings C/D/I.  Skin otherwise dry and warm.    LABS:  BMET  Recent Labs Lab 01/08/16 1814 01/09/16 0200 01/09/16 0410  NA 138 138 138  K 3.2* 3.7 3.5  CL 117* 117* 117*  CO2 11* 12* 13*  BUN 16 18 17   CREATININE 0.81 0.88 0.80  GLUCOSE 162* 208* 216*    Electrolytes  Recent Labs Lab 01/08/16 0050  01/08/16 1814 01/09/16 0200 01/09/16 0410  CALCIUM 7.6*  < > 8.0* 8.1* 8.2*  MG 2.0  --   --   --  1.9  PHOS <1.0*  --   --   --  <1.0*  < > = values in this interval not displayed.  CBC  Recent Labs Lab 01/07/16 1227  01/08/16 0050  01/08/16 1335 01/08/16 1345 01/09/16 0410  WBC 25.1*  --  13.4*  --   --   --  15.1*  HGB  16.5  < > 10.8*  < > 11.6* 10.2* 10.6*  HCT 48.0  < > 31.3*  < > 34.0* 30.0* 30.5*  PLT 278  --  188  --   --   --  234  < > = values in this interval not displayed.  Coag's No results for input(s): APTT, INR in the last 168 hours.  Sepsis Markers  Recent Labs Lab 01/07/16 2200 01/08/16 0050 01/08/16 0430  LATICACIDVEN 1.0 1.1 1.1    ABG  Recent Labs Lab 01/08/16 1345 01/08/16 1456 01/09/16 0910  PHART 7.182* 7.320* 7.395  PCO2ART 41.8 24.3* 18.5*  PO2ART 431.0* 154.0* 175*    Liver Enzymes  Recent Labs Lab 01/07/16 1227  AST 14*  ALT 23  ALKPHOS 149*  BILITOT 1.8*  ALBUMIN 3.2*    Cardiac Enzymes  Recent Labs Lab 01/07/16 2055  TROPONINI 0.03    Glucose  Recent Labs Lab 01/09/16 0408 01/09/16 0502 01/09/16 0607 01/09/16 0702 01/09/16 0754 01/09/16 0918  GLUCAP 178* 170* 173* 192* 234* 227*    Imaging Dg Chest Port 1 View  01/09/2016  CLINICAL DATA:  Ventilator dependent respiratory failure. EXAM: PORTABLE CHEST 1 VIEW COMPARISON:  01/08/2016 FINDINGS: Support apparatus stable in satisfactorily  position. The lungs appear clear.  Cardiac and mediastinal contours normal. No pleural effusion identified. IMPRESSION: 1. The lungs appear clear. Support apparatus satisfactorily position. Electronically Signed   By: Gaylyn Rong M.D.   On: 01/09/2016 07:59     STUDIES:  CT A/P 01/11 > large volume of gas at the left scrotum that tracks into the perineum compatible with Fournier's gangrene. Likely intrascrotal abscess in the right scrotum with fluid tracking posteriorly toward the anus.  CULTURES: Blood 01/11 > Urine 01/11s > Wound 01/11 > e coli >>> Anaerobic culture (wound) 1/11: abd GPC/abd GNR/GPR>>>  ANTIBIOTICS: Vanc 01/11 > Imipenem 01/11 > Clinda 01/11 >  SIGNIFICANT EVENTS: 01/11 > admitted with DKA due to Fournier's Gangrene > taken to OR for emergent debridement, planning for return trip to OR 01/12.  LINES/TUBES: ETT  01/11 >  DISCUSSION: This is a 20 year old male with a history of DM 2 with noncompliance with medication due to lack of insurance. He presents with DKA due to Fournier's gangrene. He was taken to the OR for emergent debridement 01/11 & 1/12. He returned to the ICU on the ventilator. AG now closed and has NAGMA d/t hyperchloremia after DKA protocol. He is awake, CXR is clear, follows commands briskly and F/vt is excellent. Will d/c gtts after this am's dressing changes and then extubate. Will change IVF to LR, cont SSI protocol. Hope to transition to [facility] insulin soon.   ASSESSMENT / PLAN:  INFECTIOUS A:   Sepsis - due to Fournier's gangrene; s/p OR debridement 01/11 and 1/12 >cultures growing e-coli  P:   Abx as above (vanc / imipenem/ clinda)-->cont these as above until all cultures completed  Follow cultures as above. Post op care per CCS. Change to bedside dressing change per surgery   ENDOCRINE A:   DKA. Hx DM 2 - has hx of noncompliance with medications due to lack of insurance. P:   Insulin per DKA protocol. Changed to LR-->hope we can get off insulin gtt   We'll need CSW to assist with medication upon discharge. Assess Hgb A1c.  CARDIOVASCULAR A:  Sepsis - due to Fournier's gangrene; s/p OR debridement 01/11. P:  Cont tele Cont IVFs  PULMONARY A: VDRF - Fournier's gangrene as well as in anticipation of future OR trips for further washouts. Hx of asthma. Passed SBT. No distress. P:   Extubate today Wean O2 Mobilize as able  Albuterol PRN. CXR PRN  RENAL A:   AKI - resolved. NAGMA -->in setting of hyperchloremia  P:   Fluids per DKA protocol-->change IVFs to D5 LR BMP q6hrs.  GASTROINTESTINAL A:   GI prophylaxis. Nutrition. P:   SUP: Pantoprazole. NPO-->adv per diet per protocol   HEMATOLOGIC A:   VTE Prophylaxis. P:  SCD's / heparin. CBC in AM.  NEUROLOGIC A:   Acute encephalopathy due to sedation-->improved  P:   D/c gtt PRN fent for  analgesia   Family updated: None.  Interdisciplinary Family Meeting v Palliative Care Meeting:  Due by: 01/17.   Attending Note:  I have examined patient, reviewed labs, studies and notes. I have discussed the case with Kreg Shropshire, and I agree with the data and plans as amended above. 20 yo man s/p debridement for Fournier's Gangrene c/b DKA. On my eval he remains on insulin gtt, wakes to voice, is able to take excellent VT spontaneously. Will plan to extubate today following his dressing change. Will change IVF to LR with goal of decreasing and stopping insulin gtt.  Independent critical care time is 40 minutes.   Levy Pupaobert Dynastee Brummell, MD, PhD 01/09/2016, 10:58 AM  [facility] [phone] or if no answer [phone]

## 2016-01-09 NOTE — Procedures (Signed)
Extubation Procedure Note  Patient Details:   Name: Shawn RoyalsCaleb M Harmon DOB: 11-08-1996 MRN: [id number]   Airway Documentation:     Evaluation  O2 sats: stable throughout Complications: No apparent complications Patient did tolerate procedure well. Bilateral Breath Sounds: Clear, Diminished Suctioning: Airway Yes  Patient tolerated wean. MD ordered to extubate. Positive for cuff leak. Patient extubated to a 2 Lpm nasal cannula. No signs of dyspnea or stridor. Patient resting comfortably. RN at bedside. Will continue to monitor.  Shawn Harmon, Shawn Harmon 01/09/2016, 12:37 PM

## 2016-01-09 NOTE — Progress Notes (Signed)
1 Day Post-Op  Subjective: Pt with no acute events. Con't sedated on vent  Objective: Vital signs in last 24 hours: Temp:  [98.3 F (36.8 C)-100.3 F (37.9 C)] 100.3 F (37.9 C) (01/13 0406) Pulse Rate:  [77-96] 96 (01/13 0700) Resp:  [0-35] 35 (01/13 0700) BP: (89-113)/(36-65) 113/62 mmHg (01/13 0700) SpO2:  [91 %-100 %] 99 % (01/13 0700) Arterial Line BP: (68-113)/(34-58) 96/48 mmHg (01/13 0700) FiO2 (%):  [30 %] 30 % (01/13 0341) Weight:  [98.6 kg (217 lb 6 oz)] 98.6 kg (217 lb 6 oz) (01/13 0400)    Intake/Output from previous day: 01/12 0701 - 01/13 0700 In: 7074.7 [I.V.:4994.7; NG/GT:30; IV Piggyback:2050] Out: 1025 [Urine:1025] Intake/Output this shift:    General appearance: alert and follows commands Incision/Wound: packed with kerlix  Lab Results:   Recent Labs  01/08/16 0050 01/09/16 0410  WBC 13.4* 15.1*  HGB 10.8* 10.6*  HCT 31.3* 30.5*  PLT 188 234   BMET  Recent Labs  01/09/16 0200 01/09/16 0410  NA 138 138  K 3.7 3.5  CL 117* 117*  CO2 12* 13*  GLUCOSE 208* 216*  BUN 18 17  CREATININE 0.88 0.80  CALCIUM 8.1* 8.2*   PT/INR No results for input(s): LABPROT, INR in the last 72 hours. ABG  Recent Labs  01/08/16 0442 01/08/16 1456  PHART 7.319* 7.320*  HCO3 14.6* 12.5*    Studies/Results: Ct Abdomen Pelvis W Contrast  01/07/2016  CLINICAL DATA:  Scrotal abscess for a few days. Pain. Initial encounter. EXAM: CT ABDOMEN AND PELVIS WITH CONTRAST TECHNIQUE: Multidetector CT imaging of the abdomen and pelvis was performed using the standard protocol following bolus administration of intravenous contrast. CONTRAST:  100 mL OMNIPAQUE IOHEXOL 300 MG/ML  SOLN COMPARISON:  None. FINDINGS: The lung bases are clear.  No pleural or pericardial effusion. No left kidney is identified. There is compensatory hypertrophy of the right kidney. There is mild fullness of the right renal collecting system. No stone or other obstructing lesion is identified.  The spleen, adrenal glands and biliary tree normal. The pancreas is somewhat diminutive but no focal lesion is identified. Gas is seen in the left side of the scrotum tracking posteriorly into the perineum and near the anus. No fluid collection is identified in the left scrotum. There is a fluid collection right scrotum measuring approximately 3.5 cm AP by 2.6 cm transverse by 4.3 cm craniocaudal worrisome for an intrascrotal abscess. Fluid tracks posteriorly toward the anus for a perianal collection measuring 2.6 cm AP x 0.8 cm transverse is identified. No intrapelvic fluid collection is identified. The stomach, small and large bowel and appendix appear normal. No lymphadenopathy is seen. No focal bony abnormality is identified. IMPRESSION: Large volume of gas the left scrotum tracks to the perineum compatible with Fournier's gangrene. Likely intrascrotal abscess in the right scrotum is identified with fluid tracking posteriorly toward the anus for a small perianal fluid collection is seen. Solitary right kidney. Critical Value/emergent results were called by telephone at the time of interpretation on 01/07/2016 at 3:10 pm to Dr. Blane Ohara , who verbally acknowledged these results. Electronically Signed   By: Drusilla Kanner M.D.   On: 01/07/2016 15:10   Portable Chest Xray  01/08/2016  CLINICAL DATA:  Ventilator dependent respiratory failure EXAM: PORTABLE CHEST 1 VIEW COMPARISON:  Portable chest x-ray of November 06, 2016. FINDINGS: The lungs are well-expanded and clear. The heart and pulmonary vascularity are normal. There is no pleural effusion or pneumothorax. The endotracheal  tube tip projects approximately 5.8 cm above the carina. The esophagogastric tube has its proximal port above the level of the GE junction. The right internal jugular venous catheter tip projects over the midportion of the SVC. IMPRESSION: 1. There is no active cardiopulmonary disease. 2. The esophagogastric tube needs to be  advanced approximately 10 cm to assure that the proximal port remains below the GE junction. 3. The endotracheal tube and right internal jugular venous catheter are in reasonable position. Electronically Signed   By: David  SwazilandJordan M.D.   On: 01/08/2016 07:27   Dg Chest Port 1 View  01/07/2016  CLINICAL DATA:  Acute respiratory failure. On ventilator. Controlled insulin dependent diabetes. Obesity. EXAM: PORTABLE CHEST 1 VIEW COMPARISON:  None. FINDINGS: Endotracheal tube is seen in place with tip approximately 4 cm above the carina. Right jugular central venous catheter is seen with tip in the distal SVC. Orogastric tube tip is seen in the proximal stomach. Patient is rotated to the right. There is no evidence of pulmonary consolidation or edema. No evidence pleural effusion. Heart size is within normal limits. IMPRESSION: Support lines and tubes in appropriate position. No active lung disease. Electronically Signed   By: Myles RosenthalJohn  Stahl M.D.   On: 01/07/2016 20:03    Anti-infectives: Anti-infectives    Start     Dose/Rate Route Frequency Ordered Stop   01/08/16 0400  vancomycin (VANCOCIN) 1,250 mg in sodium chloride 0.9 % 250 mL IVPB     1,250 mg 166.7 mL/hr over 90 Minutes Intravenous Every 8 hours 01/07/16 1923     01/08/16 0100  clindamycin (CLEOCIN) IVPB 900 mg     900 mg 100 mL/hr over 30 Minutes Intravenous Every 8 hours 01/07/16 1923     01/07/16 1930  vancomycin (VANCOCIN) 1,500 mg in sodium chloride 0.9 % 500 mL IVPB     1,500 mg 250 mL/hr over 120 Minutes Intravenous  Once 01/07/16 1923 01/07/16 2230   01/07/16 1930  imipenem-cilastatin (PRIMAXIN) 500 mg in sodium chloride 0.9 % 100 mL IVPB     500 mg 200 mL/hr over 30 Minutes Intravenous Every 6 hours 01/07/16 1923     01/07/16 1515  meropenem (MERREM) 1 g in sodium chloride 0.9 % 100 mL IVPB  Status:  Discontinued     1 g 200 mL/hr over 30 Minutes Intravenous 3 times per day 01/07/16 1502 01/07/16 1923   01/07/16 1345  vancomycin  (VANCOCIN) IVPB 1000 mg/200 mL premix     1,000 mg 200 mL/hr over 60 Minutes Intravenous  Once 01/07/16 1343 01/07/16 1518   01/07/16 1345  clindamycin (CLEOCIN) IVPB 900 mg     900 mg 100 mL/hr over 30 Minutes Intravenous  Once 01/07/16 1343 01/07/16 1708      Assessment/Plan: s/p Procedure(s): IRRIGATION AND DEBRIDEMENT GROIN (Bilateral) Appreciate CCM assistance Con't abx Micro noted [facility], pt unlikely to need reoperation for debridement   LOS: 2 days    Marigene Ehlersamirez Jr., [facility] 01/09/2016

## 2016-01-09 NOTE — Progress Notes (Signed)
CRITICAL VALUE ALERT  Critical value received:  Ph 7.39 Co2 18.5 O2 175 Bicarb 11.1  Date of notification:  01/12/16  Time of notification:  0430  Critical value read back: Yes  Nurse who received alert:  Eddie NorthJennifer Ajiah Mcglinn, RN  MD notified (1st page):  ELINK  Time of first page:  0430  MD notified (2nd page):  Time of second page:  Responding MD:  Pola CornElink Dr. Darrick Pennaeterding  Time MD responded:  [phone]

## 2016-01-09 NOTE — Progress Notes (Signed)
  CRITICAL VALUE ALERT  Critical value received:  Phos less than 1  Date of notification:  01/09/16   Time of notification:  2309  Critical value read back:Yes.    Nurse who received: DPiner RN  MD notified (1st page):  Dr Deterding   Time of first page:  2309  MD notified (2nd page):  Time of second page:  Responding MD:  Dr Darrick Pennaeterding  Time MD responded:  Orders written

## 2016-01-09 NOTE — Progress Notes (Signed)
eLink Physician-Brief Progress Note Patient Name: Shawn RoyalsCaleb M Harmon DOB: [birth year] MRN: [id number]   Date of Service  01/09/2016  HPI/Events of Note  Hypokalemia and hypophosphatemia  eICU Interventions  Potassium and Phos replaced     Intervention Category Intermediate Interventions: Electrolyte abnormality - evaluation and management  DETERDING,[name] 01/09/2016, 5:23 AM

## 2016-01-09 NOTE — Progress Notes (Signed)
Pharmacy Antibiotic Follow-up Note  Shawn Harmon is a 20 y.o. year-old male admitted on 01/07/2016.  The patient is currently on day 3 of imipenem/clindamycin/vancomycin  for sepsis due to Fournier's gangrene. -WBC= 15.1, tmax= 101.9, SCr= 0.8 and CrCl > 100.   Plan: -No antibiotic adjustments needed -Consider de-escalation of antibiotics based on final culture results   Temp (24hrs), Avg:99.5 F (37.5 C), Min:98.3 F (36.8 C), Max:100.8 F (38.2 C)   Recent Labs Lab 01/07/16 1227 01/08/16 0050 01/09/16 0410  WBC 25.1* 13.4* 15.1*    Recent Labs Lab 01/08/16 1259 01/08/16 1814 01/09/16 0200 01/09/16 0410 01/09/16 1200  CREATININE 0.81 0.81 0.88 0.80 0.76   Estimated Creatinine Clearance: 179.2 mL/min (by C-G formula based on Cr of 0.76).    Allergies  Allergen Reactions  . Amoxicillin Itching    Antimicrobials this admission: Meropenem 1/11 x 1 dose  Clinda 1/11 >> Vanc 1/11 >> Imipenem 1/11>>   Levels/dose changes this admission: n/a  Microbiology results: 1/11 BCx2: ngtd 1/11 UCx: neg 1/11 abscess: few e coli   Thank you for allowing pharmacy to be a part of this patient's care.  Benny LennertMeyer, [place] David PharmD 01/09/2016 1:04 PM

## 2016-01-10 LAB — PHOSPHORUS: Phosphorus: 1.9 mg/dL — ABNORMAL LOW (ref 2.5–4.6)

## 2016-01-10 LAB — BASIC METABOLIC PANEL
Anion gap: 10 (ref 5–15)
BUN: 6 mg/dL (ref 6–20)
CHLORIDE: 113 mmol/L — AB (ref 101–111)
CO2: 16 mmol/L — ABNORMAL LOW (ref 22–32)
CREATININE: 0.75 mg/dL (ref 0.61–1.24)
Calcium: 7.8 mg/dL — ABNORMAL LOW (ref 8.9–10.3)
GFR calc Af Amer: >60 mL/min (ref >60)
GLUCOSE: 172 mg/dL — AB (ref 65–99)
POTASSIUM: 2.9 mmol/L — AB (ref 3.5–5.1)
Sodium: 139 mmol/L (ref 135–145)

## 2016-01-10 LAB — GLUCOSE, CAPILLARY
GLUCOSE-CAPILLARY: 152 mg/dL — AB (ref 65–99)
GLUCOSE-CAPILLARY: 165 mg/dL — AB (ref 65–99)
GLUCOSE-CAPILLARY: 171 mg/dL — AB (ref 65–99)
GLUCOSE-CAPILLARY: 204 mg/dL — AB (ref 65–99)
Glucose-Capillary: 135 mg/dL — ABNORMAL HIGH (ref 65–99)
Glucose-Capillary: 152 mg/dL — ABNORMAL HIGH (ref 65–99)
Glucose-Capillary: 201 mg/dL — ABNORMAL HIGH (ref 65–99)
Glucose-Capillary: 209 mg/dL — ABNORMAL HIGH (ref 65–99)
Glucose-Capillary: 98 mg/dL (ref 65–99)

## 2016-01-10 LAB — CBC
HCT: 32.1 % — ABNORMAL LOW (ref 39.0–52.0)
HEMOGLOBIN: 11 g/dL — AB (ref 13.0–17.0)
MCH: 27.1 pg (ref 26.0–34.0)
MCHC: 34.3 g/dL (ref 30.0–36.0)
MCV: 79.1 fL (ref 78.0–100.0)
Platelets: 266 10*3/uL (ref 150–400)
RBC: 4.06 MIL/uL — AB (ref 4.22–5.81)
RDW: 14.7 % (ref 11.5–15.5)
WBC: 14.8 10*3/uL — ABNORMAL HIGH (ref 4.0–10.5)

## 2016-01-10 LAB — CULTURE, ROUTINE-ABSCESS

## 2016-01-10 LAB — MAGNESIUM: Magnesium: 1.6 mg/dL — ABNORMAL LOW (ref 1.7–2.4)

## 2016-01-10 MED ORDER — MAGNESIUM SULFATE 2 GM/50ML IV SOLN
2.0000 g | Freq: Once | INTRAVENOUS | Status: AC
  Administered 2016-01-10: 2 g via INTRAVENOUS
  Filled 2016-01-10: qty 50

## 2016-01-10 MED ORDER — INSULIN ASPART 100 UNIT/ML ~~LOC~~ SOLN
0.0000 [IU] | Freq: Every day | SUBCUTANEOUS | Status: DC
  Administered 2016-01-10: 2 [IU] via SUBCUTANEOUS
  Administered 2016-01-11: 3 [IU] via SUBCUTANEOUS

## 2016-01-10 MED ORDER — INSULIN GLARGINE 100 UNIT/ML ~~LOC~~ SOLN
20.0000 [IU] | SUBCUTANEOUS | Status: DC
  Administered 2016-01-10 – 2016-01-12 (×3): 20 [IU] via SUBCUTANEOUS
  Filled 2016-01-10 (×4): qty 0.2

## 2016-01-10 MED ORDER — POTASSIUM CHLORIDE 10 MEQ/50ML IV SOLN
10.0000 meq | INTRAVENOUS | Status: AC
  Administered 2016-01-10 (×6): 10 meq via INTRAVENOUS
  Filled 2016-01-10 (×2): qty 50

## 2016-01-10 MED ORDER — POTASSIUM PHOSPHATES 15 MMOLE/5ML IV SOLN
30.0000 mmol | Freq: Once | INTRAVENOUS | Status: AC
  Administered 2016-01-10: 30 mmol via INTRAVENOUS
  Filled 2016-01-10: qty 10

## 2016-01-10 MED ORDER — INSULIN ASPART 100 UNIT/ML ~~LOC~~ SOLN
0.0000 [IU] | Freq: Three times a day (TID) | SUBCUTANEOUS | Status: DC
  Administered 2016-01-10 – 2016-01-11 (×4): 5 [IU] via SUBCUTANEOUS
  Administered 2016-01-11: 3 [IU] via SUBCUTANEOUS
  Administered 2016-01-12: 5 [IU] via SUBCUTANEOUS
  Administered 2016-01-12: 3 [IU] via SUBCUTANEOUS
  Administered 2016-01-12 – 2016-01-14 (×5): 5 [IU] via SUBCUTANEOUS
  Administered 2016-01-14 (×2): 3 [IU] via SUBCUTANEOUS
  Administered 2016-01-15: 2 [IU] via SUBCUTANEOUS
  Administered 2016-01-15: 3 [IU] via SUBCUTANEOUS

## 2016-01-10 NOTE — Progress Notes (Signed)
2 Days Post-Op  Subjective: Awake and alert Tolerating dressing changes  Objective: Vital signs in last 24 hours: Temp:  [99.1 F (37.3 C)-99.6 F (37.6 C)] 99.6 F (37.6 C) (01/14 0723) Pulse Rate:  [11-133] 121 (01/14 0700) Resp:  [7-41] 23 (01/14 0700) BP: (106-149)/(54-114) 128/80 mmHg (01/14 0700) SpO2:  [80 %-100 %] 94 % (01/14 0700) Arterial Line BP: (82-174)/(43-95) 174/95 mmHg (01/14 0700) FiO2 (%):  [30 %] 30 % (01/13 1200) Last BM Date: 01/09/16  Intake/Output from previous day: 01/13 0701 - 01/14 0700 In: 2321 [P.O.:240; I.V.:1381; IV Piggyback:700] Out: 4375 [Urine:4375] Intake/Output this shift:    Scrotal wound stable  Lab Results:   Recent Labs  01/09/16 0410 01/10/16 0437  WBC 15.1* 14.8*  HGB 10.6* 11.0*  HCT 30.5* 32.1*  PLT 234 266   BMET  Recent Labs  01/09/16 1200 01/10/16 0437  NA 138 139  K 3.4* 2.9*  CL 117* 113*  CO2 14* 16*  GLUCOSE 222* 172*  BUN 15 6  CREATININE 0.76 0.75  CALCIUM 8.0* 7.8*   PT/INR No results for input(s): LABPROT, INR in the last 72 hours. ABG  Recent Labs  01/08/16 1456 01/09/16 0910  PHART 7.320* 7.395  HCO3 12.5* 11.1*    Studies/Results: Dg Chest Port 1 View  01/09/2016  CLINICAL DATA:  Ventilator dependent respiratory failure. EXAM: PORTABLE CHEST 1 VIEW COMPARISON:  01/08/2016 FINDINGS: Support apparatus stable in satisfactorily position. The lungs appear clear.  Cardiac and mediastinal contours normal. No pleural effusion identified. IMPRESSION: 1. The lungs appear clear. Support apparatus satisfactorily position. Electronically Signed   By: Gaylyn RongWalter  Liebkemann M.D.   On: 01/09/2016 07:59    Anti-infectives: Anti-infectives    Start     Dose/Rate Route Frequency Ordered Stop   01/08/16 0400  vancomycin (VANCOCIN) 1,250 mg in sodium chloride 0.9 % 250 mL IVPB     1,250 mg 166.7 mL/hr over 90 Minutes Intravenous Every 8 hours 01/07/16 1923     01/08/16 0100  clindamycin (CLEOCIN) IVPB 900  mg     900 mg 100 mL/hr over 30 Minutes Intravenous Every 8 hours 01/07/16 1923     01/07/16 1930  vancomycin (VANCOCIN) 1,500 mg in sodium chloride 0.9 % 500 mL IVPB     1,500 mg 250 mL/hr over 120 Minutes Intravenous  Once 01/07/16 1923 01/07/16 2230   01/07/16 1930  imipenem-cilastatin (PRIMAXIN) 500 mg in sodium chloride 0.9 % 100 mL IVPB     500 mg 200 mL/hr over 30 Minutes Intravenous Every 6 hours 01/07/16 1923     01/07/16 1515  meropenem (MERREM) 1 g in sodium chloride 0.9 % 100 mL IVPB  Status:  Discontinued     1 g 200 mL/hr over 30 Minutes Intravenous 3 times per day 01/07/16 1502 01/07/16 1923   01/07/16 1345  vancomycin (VANCOCIN) IVPB 1000 mg/200 mL premix     1,000 mg 200 mL/hr over 60 Minutes Intravenous  Once 01/07/16 1343 01/07/16 1518   01/07/16 1345  clindamycin (CLEOCIN) IVPB 900 mg     900 mg 100 mL/hr over 30 Minutes Intravenous  Once 01/07/16 1343 01/07/16 1708      Assessment/Plan: s/p Procedure(s): IRRIGATION AND DEBRIDEMENT GROIN (Bilateral)  Continuing antibiotics and local wound care WBC trending down  LOS: 3 days    Shawn Harmon A 01/10/2016

## 2016-01-10 NOTE — Progress Notes (Signed)
eLink Physician-Brief Progress Note Patient Name: Shawn RoyalsCaleb M Harmon DOB: Dec 16, 1996 MRN: [id number]   Date of Service  01/10/2016  HPI/Events of Note  Hypokalemia, hypophos, hypomag  eICU Interventions  Potassium , phos, and mag replaced     Intervention Category Intermediate Interventions: Electrolyte abnormality - evaluation and management  DETERDING,[name] 01/10/2016, 5:57 AM

## 2016-01-10 NOTE — Progress Notes (Signed)
PULMONARY / CRITICAL CARE MEDICINE   Name: Shawn RoyalsCaleb M Harmon MRN: [id number] DOB: February 15, 1996    ADMISSION DATE:  01/07/2016 CONSULTATION DATE:  01/07/16  REFERRING MD:  EDP  CHIEF COMPLAINT:  Rectal abscess  HISTORY OF PRESENT ILLNESS 19 yom admitted w/ DKA in setting of Fournier's Gangrene  SUBJECTIVE:  Extubated Remains on insulin gtt at 2 [place] [facility]   VITAL SIGNS: BP 113/76 mmHg  Pulse 119  Temp(Src) 99.2 F (37.3 C) (Oral)  Resp 19  Ht 6' (1.829 m)  Wt 98.6 kg (217 lb 6 oz)  BMI 29.47 kg/m2  SpO2 100%  HEMODYNAMICS:    VENTILATOR SETTINGS: Vent Mode:  [-] PSV;CPAP FiO2 (%):  [30 %] 30 % Set Rate:  [35 bmp] 35 bmp Vt Set:  [560 mL] 560 mL PEEP:  [5 cmH20] 5 cmH20 Pressure Support:  [5 cmH20] 5 cmH20 Plateau Pressure:  [20 cmH20] 20 cmH20  INTAKE / OUTPUT: I/O last 3 completed shifts: In: 9345.7 [P.O.:240; I.V.:6375.7; NG/GT:30; IV Piggyback:2700] Out: 2200 [Urine:2200]   PHYSICAL EXAMINATION: General: Young caucasian male, awake, comfortable Neuro: Awake, appropriate w/ no focal def  HEENT: [place]/AT. PERRL, sclerae anicteric. Cardiovascular: RRR, no M/R/G.  Lungs: Respirations even and unlabored.  CTA bilaterally, No W/R/R. Abdomen: BS x 4, soft, NT/ND.  Musculoskeletal: No gross deformities, no edema.  Skin: Perineal dressings C/D/I.  Skin otherwise dry and warm.    LABS:  BMET  Recent Labs Lab 01/09/16 0410 01/09/16 1200 01/10/16 0437  NA 138 138 139  K 3.5 3.4* 2.9*  CL 117* 117* 113*  CO2 13* 14* 16*  BUN 17 15 6   CREATININE 0.80 0.76 0.75  GLUCOSE 216* 222* 172*    Electrolytes  Recent Labs Lab 01/08/16 0050  01/09/16 0410 01/09/16 1200 01/10/16 0437  CALCIUM 7.6*  < > 8.2* 8.0* 7.8*  MG 2.0  --  1.9  --  1.6*  PHOS <1.0*  --  <1.0*  --  1.9*  < > = values in this interval not displayed.  CBC  Recent Labs Lab 01/08/16 0050  01/08/16 1345 01/09/16 0410 01/10/16 0437  WBC 13.4*  --   --  15.1* 14.8*  HGB 10.8*   < > 10.2* 10.6* 11.0*  HCT 31.3*  < > 30.0* 30.5* 32.1*  PLT 188  --   --  234 266  < > = values in this interval not displayed.  Coag's No results for input(s): APTT, INR in the last 168 hours.  Sepsis Markers  Recent Labs Lab 01/07/16 2200 01/08/16 0050 01/08/16 0430  LATICACIDVEN 1.0 1.1 1.1    ABG  Recent Labs Lab 01/08/16 1345 01/08/16 1456 01/09/16 0910  PHART 7.182* 7.320* 7.395  PCO2ART 41.8 24.3* 18.5*  PO2ART 431.0* 154.0* 175*    Liver Enzymes  Recent Labs Lab 01/07/16 1227  AST 14*  ALT 23  ALKPHOS 149*  BILITOT 1.8*  ALBUMIN 3.2*    Cardiac Enzymes  Recent Labs Lab 01/07/16 2055  TROPONINI 0.03    Glucose  Recent Labs Lab 01/09/16 2316 01/10/16 0019 01/10/16 0124 01/10/16 0238 01/10/16 0459 01/10/16 0607  GLUCAP 146* 135* 98 152* 152* 165*    Imaging No results found.   STUDIES:  CT A/P 01/11 > large volume of gas at the left scrotum that tracks into the perineum compatible with Fournier's gangrene. Likely intrascrotal abscess in the right scrotum with fluid tracking posteriorly toward the anus.  CULTURES: Blood 01/11 > Urine 01/11s > Wound 01/11 > e  coli >>> Anaerobic culture (wound) 1/11: abd GPC/abd GNR/GPR>>>  ANTIBIOTICS: Vanc 01/11 > Imipenem 01/11 > Clinda 01/11 >  SIGNIFICANT EVENTS: 01/11 > admitted with DKA due to Fournier's Gangrene > taken to OR for emergent debridement, planning for return trip to OR 01/12.  LINES/TUBES: ETT 01/11 >  DISCUSSION: This is a 20 year old male with a history of DM 2 with noncompliance with medication due to lack of insurance. He presents with DKA due to Fournier's gangrene. He was taken to the OR for emergent debridement 01/11 & 1/12. He returned to the ICU on the ventilator. AG now closed and has NAGMA d/t hyperchloremia after DKA protocol. Will change IVF to LR, cont SSI protocol. Hope to transition to [facility] insulin today.   ASSESSMENT / PLAN:  INFECTIOUS A:   Sepsis -  due to Fournier's gangrene; s/p OR debridement 01/11 and 1/12 >cultures growing e-coli  P:   Abx as above (vanc / imipenem/ clinda)-->cont these as above until all cultures completed  Follow cultures as above. Post op care per CCS. Change to bedside dressing change per surgery   ENDOCRINE A:   DKA. Hx DM 2 - has hx of noncompliance with medications due to lack of insurance. P:   Insulin gtt to off this am 1/14, start SSI + lantus Changed to LR IVF, KVO now We'll need CSW to assist with medication upon discharge. Assess Hgb A1c.  CARDIOVASCULAR A:  Sepsis - due to Fournier's gangrene; s/p OR debridement 01/11. P:  Cont tele Cont IVFs  PULMONARY A: VDRF - Fournier's gangrene as well as in anticipation of future OR trips for further washouts. Hx of asthma. Passed SBT. No distress. P:   Mobilize as able  Albuterol PRN. CXR PRN  RENAL A:   AKI - resolved. NAGMA -->in setting of hyperchloremia  P:   IVFs to LR BMP qd  GASTROINTESTINAL A:   GI prophylaxis. Nutrition. P:   SUP: Pantoprazole. Start diet 1/14  HEMATOLOGIC A:   VTE Prophylaxis. P:  SCD's / heparin. CBC in AM.  NEUROLOGIC A:   Acute encephalopathy due to sedation-->improved  P:   D/c gtt PRN fent for analgesia   Family updated: None.  Interdisciplinary Family Meeting v Palliative Care Meeting:  Due by: 01/17.    Levy Pupa, MD, PhD 01/10/2016, 6:42 AM [facility] [phone] or if no answer [phone]

## 2016-01-11 LAB — BASIC METABOLIC PANEL
Anion gap: 12 (ref 5–15)
BUN: 9 mg/dL (ref 6–20)
CALCIUM: 8.5 mg/dL — AB (ref 8.9–10.3)
CO2: 20 mmol/L — AB (ref 22–32)
Chloride: 106 mmol/L (ref 101–111)
Creatinine, Ser: 0.77 mg/dL (ref 0.61–1.24)
GFR calc Af Amer: >60 mL/min (ref >60)
GLUCOSE: 272 mg/dL — AB (ref 65–99)
Potassium: 3.9 mmol/L (ref 3.5–5.1)
Sodium: 138 mmol/L (ref 135–145)

## 2016-01-11 LAB — GLUCOSE, CAPILLARY
GLUCOSE-CAPILLARY: 247 mg/dL — AB (ref 65–99)
GLUCOSE-CAPILLARY: 207 mg/dL — AB (ref 65–99)
GLUCOSE-CAPILLARY: 240 mg/dL — AB (ref 65–99)
GLUCOSE-CAPILLARY: 280 mg/dL — AB (ref 65–99)
Glucose-Capillary: 180 mg/dL — ABNORMAL HIGH (ref 65–99)

## 2016-01-11 MED ORDER — POTASSIUM CHLORIDE CRYS ER 20 MEQ PO TBCR
40.0000 meq | EXTENDED_RELEASE_TABLET | ORAL | Status: AC
  Administered 2016-01-11 (×2): 40 meq via ORAL
  Filled 2016-01-11 (×2): qty 2

## 2016-01-11 MED ORDER — PHENOL 1.4 % MT LIQD
1.0000 | OROMUCOSAL | Status: DC | PRN
  Filled 2016-01-11: qty 177

## 2016-01-11 NOTE — Progress Notes (Signed)
Paged pharmacy for Primaxin medication and not received. Attempted to call x2 but no answer received. Paged for medication again.

## 2016-01-11 NOTE — Progress Notes (Signed)
Patient arrived to the unit.

## 2016-01-11 NOTE — Progress Notes (Signed)
Pt has c/o sore throat. Paged MD on call for medication to assist with. Orders received. Paged pharmacy to send up medication. pending arrival of. Patient made aware.

## 2016-01-11 NOTE — Progress Notes (Signed)
PULMONARY / CRITICAL CARE MEDICINE   Name: Shawn Harmon MRN: [id number] DOB: 1996-10-31    ADMISSION DATE:  01/07/2016 CONSULTATION DATE:  01/07/16  REFERRING MD:  EDP  CHIEF COMPLAINT:  Rectal abscess  HISTORY OF PRESENT ILLNESS 20 yom admitted w/ DKA in setting of Fournier's Gangrene  SUBJECTIVE:  Insulin gtt off vanco stopped 1/14  VITAL SIGNS: BP 118/71 mmHg  Pulse 105  Temp(Src) 98.8 F (37.1 C) (Oral)  Resp 33  Ht 6' (1.829 m)  Wt 98.6 kg (217 lb 6 oz)  BMI 29.47 kg/m2  SpO2 92%  HEMODYNAMICS: CVP:  [6 mmHg-12 mmHg] 6 mmHg  VENTILATOR SETTINGS:    INTAKE / OUTPUT: I/O last 3 completed shifts: In: 4301 [P.O.:960; I.V.:1501; IV Piggyback:1840] Out: 8175 [Urine:8175]   PHYSICAL EXAMINATION: General: Young caucasian male, awake, comfortable Neuro: Awake, appropriate w/ no focal def  HEENT: [place]/AT. PERRL, sclerae anicteric. Cardiovascular: RRR, no M/R/G.  Lungs: Respirations even and unlabored.  CTA bilaterally, No W/R/R. Abdomen: BS x 4, soft, NT/ND.  Musculoskeletal: No gross deformities, no edema.  Skin: Perineal dressings C/D/I.  Skin otherwise dry and warm.   LABS:  BMET  Recent Labs Lab 01/09/16 0410 01/09/16 1200 01/10/16 0437  NA 138 138 139  K 3.5 3.4* 2.9*  CL 117* 117* 113*  CO2 13* 14* 16*  BUN 17 15 6   CREATININE 0.80 0.76 0.75  GLUCOSE 216* 222* 172*    Electrolytes  Recent Labs Lab 01/08/16 0050  01/09/16 0410 01/09/16 1200 01/10/16 0437  CALCIUM 7.6*  < > 8.2* 8.0* 7.8*  MG 2.0  --  1.9  --  1.6*  PHOS <1.0*  --  <1.0*  --  1.9*  < > = values in this interval not displayed.  CBC  Recent Labs Lab 01/08/16 0050  01/08/16 1345 01/09/16 0410 01/10/16 0437  WBC 13.4*  --   --  15.1* 14.8*  HGB 10.8*  < > 10.2* 10.6* 11.0*  HCT 31.3*  < > 30.0* 30.5* 32.1*  PLT 188  --   --  234 266  < > = values in this interval not displayed.  Coag's No results for input(s): APTT, INR in the last 168 hours.  Sepsis  Markers  Recent Labs Lab 01/07/16 2200 01/08/16 0050 01/08/16 0430  LATICACIDVEN 1.0 1.1 1.1    ABG  Recent Labs Lab 01/08/16 1345 01/08/16 1456 01/09/16 0910  PHART 7.182* 7.320* 7.395  PCO2ART 41.8 24.3* 18.5*  PO2ART 431.0* 154.0* 175*    Liver Enzymes  Recent Labs Lab 01/07/16 1227  AST 14*  ALT 23  ALKPHOS 149*  BILITOT 1.8*  ALBUMIN 3.2*    Cardiac Enzymes  Recent Labs Lab 01/07/16 2055  TROPONINI 0.03    Glucose  Recent Labs Lab 01/10/16 0459 01/10/16 0607 01/10/16 0717 01/10/16 1148 01/10/16 1539 01/10/16 2208  GLUCAP 152* 165* 171* 204* 201* 209*    Imaging No results found.   STUDIES:  CT A/P 01/11 > large volume of gas at the left scrotum that tracks into the perineum compatible with Fournier's gangrene. Likely intrascrotal abscess in the right scrotum with fluid tracking posteriorly toward the anus.  CULTURES: Blood 01/11 > Urine 01/11s > Wound 01/11 > e coli >>> Anaerobic culture (wound) 1/11: abd GPC/abd GNR/GPR>>>  ANTIBIOTICS: Vanc 01/11 > 1/14 Imipenem 01/11 > Clinda 01/11 > 1/15  SIGNIFICANT EVENTS: 01/11 > admitted with DKA due to Fournier's Gangrene > taken to OR for emergent debridement, planning for return trip to  OR 01/12.  LINES/TUBES: ETT 01/11 > 1/12  DISCUSSION: This is a 20 year old male with a history of DM 2 with noncompliance with medication due to lack of insurance. He presents with DKA due to Fournier's gangrene. He was taken to the OR for emergent debridement 01/11 & 1/12. He returned to the ICU on the ventilator. AG now closed and has NAGMA d/t hyperchloremia after DKA protocol. Will change IVF to LR, cont SSI protocol. [facility]   ASSESSMENT / PLAN:  INFECTIOUS A:   Sepsis - due to Fournier's gangrene; s/p OR debridement 01/11 and 1/12 >cultures growing e-coli (pan-sens) P:   sropped vanco 1/14, will d/c clinda on 1/15 and continue imipenem for now given risk for  polymicrobial infxn Follow cultures as above. Post op care per CCS. Change to bedside dressing change per surgery   ENDOCRINE A:   DKA. Hx DM 2 - has hx of noncompliance with medications due to lack of insurance. P:   Insulin gtt to off, on SSI + lantus Changed to LR IVF, KVO now We'll need CSW to assist with medication upon discharge. Assess Hgb A1c.  CARDIOVASCULAR A:  Sepsis - due to Fournier's gangrene; s/p OR debridement 01/11. P:  Cont tele Cont IVFs  PULMONARY A: VDRF - Fournier's gangrene as well as in anticipation of future OR trips for further washouts. Hx of asthma. Passed SBT. No distress. P:   Mobilize as able  Albuterol PRN. CXR PRN  RENAL A:   AKI - resolved. NAGMA -->in setting of hyperchloremia  P:   IVFs to LR BMP qd  GASTROINTESTINAL A:   GI prophylaxis. Nutrition. P:   SUP: Pantoprazole. Started diet 1/14  HEMATOLOGIC A:   VTE Prophylaxis. P:  SCD's / heparin. CBC in AM.  NEUROLOGIC A:   Acute encephalopathy due to sedation-->improved  P:   D/c gtt PRN fent for analgesia   Family updated: None.  Interdisciplinary Family Meeting v Palliative Care Meeting:  Due by: 01/17.  To floor bed 1/15, To triad service as of 1/16  Levy Pupaobert Nyimah Shadduck, MD, PhD 01/11/2016, 6:36 AM [facility] [phone] or if no answer [phone]

## 2016-01-12 DIAGNOSIS — E1165 Type 2 diabetes mellitus with hyperglycemia: Secondary | ICD-10-CM

## 2016-01-12 DIAGNOSIS — Z9889 Other specified postprocedural states: Secondary | ICD-10-CM

## 2016-01-12 DIAGNOSIS — E875 Hyperkalemia: Secondary | ICD-10-CM

## 2016-01-12 DIAGNOSIS — B9620 Unspecified Escherichia coli [E. coli] as the cause of diseases classified elsewhere: Secondary | ICD-10-CM

## 2016-01-12 LAB — GLUCOSE, CAPILLARY
Glucose-Capillary: 229 mg/dL — ABNORMAL HIGH (ref 65–99)
Glucose-Capillary: 231 mg/dL — ABNORMAL HIGH (ref 65–99)
Glucose-Capillary: 200 mg/dL — ABNORMAL HIGH (ref 65–99)
Glucose-Capillary: 184 mg/dL — ABNORMAL HIGH (ref 65–99)

## 2016-01-12 LAB — BASIC METABOLIC PANEL
Anion gap: 11 (ref 5–15)
BUN: 10 mg/dL (ref 6–20)
CO2: 16 mmol/L — AB (ref 22–32)
Calcium: 8 mg/dL — ABNORMAL LOW (ref 8.9–10.3)
Chloride: 108 mmol/L (ref 101–111)
Creatinine, Ser: 0.71 mg/dL (ref 0.61–1.24)
GFR calc Af Amer: >60 mL/min (ref >60)
GLUCOSE: 224 mg/dL — AB (ref 65–99)
POTASSIUM: 5.5 mmol/L — AB (ref 3.5–5.1)
Sodium: 135 mmol/L (ref 135–145)

## 2016-01-12 LAB — CULTURE, BLOOD (ROUTINE X 2)
Culture: NO GROWTH
Culture: NO GROWTH

## 2016-01-12 LAB — ANAEROBIC CULTURE

## 2016-01-12 MED ORDER — DIPHENHYDRAMINE HCL 25 MG PO CAPS
50.0000 mg | ORAL_CAPSULE | Freq: Once | ORAL | Status: AC
  Administered 2016-01-12: 50 mg via ORAL
  Filled 2016-01-12: qty 2

## 2016-01-12 MED ORDER — DIPHENHYDRAMINE HCL 25 MG PO CAPS: 25.0000 mg | ORAL_CAPSULE | ORAL | Status: DC | PRN

## 2016-01-12 MED ORDER — ACETAMINOPHEN 160 MG/5ML PO SOLN
650.0000 mg | Freq: Four times a day (QID) | ORAL | Status: DC | PRN
  Filled 2016-01-12: qty 20.3

## 2016-01-12 MED ORDER — INSULIN GLARGINE 100 UNIT/ML ~~LOC~~ SOLN
5.0000 [IU] | Freq: Once | SUBCUTANEOUS | Status: AC
  Administered 2016-01-12: 5 [IU] via SUBCUTANEOUS
  Filled 2016-01-12: qty 0.05

## 2016-01-12 MED ORDER — INSULIN GLARGINE 100 UNIT/ML ~~LOC~~ SOLN
25.0000 [IU] | SUBCUTANEOUS | Status: DC
  Administered 2016-01-13: 25 [IU] via SUBCUTANEOUS
  Filled 2016-01-12: qty 0.25

## 2016-01-12 MED ORDER — SODIUM BICARBONATE 650 MG PO TABS
650.0000 mg | ORAL_TABLET | Freq: Three times a day (TID) | ORAL | Status: DC
  Administered 2016-01-12 – 2016-01-15 (×10): 650 mg via ORAL
  Filled 2016-01-12 (×14): qty 1

## 2016-01-12 NOTE — Progress Notes (Signed)
Patient ID: Shawn Harmon, male   DOB: [birth year], 20 y.o.   MRN: [id number] 4 Days Post-Op  Subjective: Pt feels ok.  No major complaints.  Some pain with dressing change.  Objective: Vital signs in last 24 hours: Temp:  [98.1 F (36.7 C)-99.3 F (37.4 C)] 99 F (37.2 C) (01/16 0619) Pulse Rate:  [100-113] 102 (01/16 0619) Resp:  [19-32] 20 (01/16 0619) BP: (111-132)/(66-90) 127/79 mmHg (01/16 0619) SpO2:  [93 %-96 %] 96 % (01/16 0619) Last BM Date: 01/11/16  Intake/Output from previous day: 01/15 0701 - 01/16 0700 In: 510 [P.O.:410; IV Piggyback:100] Out: 2000 [Urine:2000] Intake/Output this shift:    PE: Skin: perineal wound with some fibrinous exudate, but no gross infection.  Testicles with some fibrin on them as well.  Lab Results:   Recent Labs  01/10/16 0437  WBC 14.8*  HGB 11.0*  HCT 32.1*  PLT 266   BMET  Recent Labs  01/11/16 1858 01/12/16 0535  NA 138 135  K 3.9 5.5*  CL 106 108  CO2 20* 16*  GLUCOSE 272* 224*  BUN 9 10  CREATININE 0.77 0.71  CALCIUM 8.5* 8.0*   PT/INR No results for input(s): LABPROT, INR in the last 72 hours. CMP     Component Value Date/Time   NA 135 01/12/2016 0535   K 5.5* 01/12/2016 0535   CL 108 01/12/2016 0535   CO2 16* 01/12/2016 0535   GLUCOSE 224* 01/12/2016 0535   BUN 10 01/12/2016 0535   CREATININE 0.71 01/12/2016 0535   CALCIUM 8.0* 01/12/2016 0535   PROT 8.0 01/07/2016 1227   ALBUMIN 3.2* 01/07/2016 1227   AST 14* 01/07/2016 1227   ALT 23 01/07/2016 1227   ALKPHOS 149* 01/07/2016 1227   BILITOT 1.8* 01/07/2016 1227   GFRNONAA >60 01/12/2016 0535   GFRAA >60 01/12/2016 0535   Lipase  No results found for: LIPASE     Studies/Results: No results found.  Anti-infectives: Anti-infectives    Start     Dose/Rate Route Frequency Ordered Stop   01/08/16 0400  vancomycin (VANCOCIN) 1,250 mg in sodium chloride 0.9 % 250 mL IVPB  Status:  Discontinued     1,250 mg 166.7 mL/hr over 90 Minutes  Intravenous Every 8 hours 01/07/16 1923 01/10/16 1544   01/08/16 0100  clindamycin (CLEOCIN) IVPB 900 mg  Status:  Discontinued     900 mg 100 mL/hr over 30 Minutes Intravenous Every 8 hours 01/07/16 1923 01/11/16 0643   01/07/16 1930  vancomycin (VANCOCIN) 1,500 mg in sodium chloride 0.9 % 500 mL IVPB     1,500 mg 250 mL/hr over 120 Minutes Intravenous  Once 01/07/16 1923 01/07/16 2230   01/07/16 1930  imipenem-cilastatin (PRIMAXIN) 500 mg in sodium chloride 0.9 % 100 mL IVPB     500 mg 200 mL/hr over 30 Minutes Intravenous Every 6 hours 01/07/16 1923     01/07/16 1515  meropenem (MERREM) 1 g in sodium chloride 0.9 % 100 mL IVPB  Status:  Discontinued     1 g 200 mL/hr over 30 Minutes Intravenous 3 times per day 01/07/16 1502 01/07/16 1923   01/07/16 1345  vancomycin (VANCOCIN) IVPB 1000 mg/200 mL premix     1,000 mg 200 mL/hr over 60 Minutes Intravenous  Once 01/07/16 1343 01/07/16 1518   01/07/16 1345  clindamycin (CLEOCIN) IVPB 900 mg     900 mg 100 mL/hr over 30 Minutes Intravenous  Once 01/07/16 1343 01/07/16 1708  Assessment/Plan   POD 5,4, s/p incision and debridement of perineum and scrotum for fournier's gangrene -cont BID dressing changes -will arrange HH for Dc -PT hydrotherapy to help clean the wound up -[facility] likely as outpatient to discuss grafting options, etc -ID consult recommended to determine duration of abx therapy given poorly controlled DM  LOS: 5 days    Coolidge Gossard E 01/12/2016, 10:00 AM Pager: [phone]

## 2016-01-12 NOTE — Consult Note (Addendum)
[facility]       Reason for Consult:Fournier's gangrene    Referring Physician: Dr. Waymon Amato  Active Problems:   Fournier's gangrene   . heparin  5,000 Units Subcutaneous 3 times per day  . imipenem-cilastatin  500 mg Intravenous Q6H  . insulin aspart  0-15 Units Subcutaneous TID WC  . insulin aspart  0-5 Units Subcutaneous QHS  . [START ON 01/13/2016] insulin glargine  25 Units Subcutaneous Q24H  . sodium bicarbonate  650 mg Oral TID    Recommendations: Iv ertapenem for 2 more weeks at discharge An alternative to ertapenem is ceftriaxone 2 grams daily with oral flagyl 500 mg TID picc  Routine HIV screening  Assessment: He has Fournier's gangrene Uncontrolled type 2 diabetes with A1C of 15.5.    Antibiotics: imipenem  HPI: Shawn Harmon is a 20 y.o. male with DM, A1C of 15, came in with perineal abscess and emergent operative debridement for Fournier's.  Started on vancomycin, clindamycin and imipenem.  Went to OR twice, now with no further necrotic tissue.  Getting hydrotherapy.  No fever or chills.  Completed about 5 days of clindamycin.  OR report noted dishwater-like fluid and culture with E coli suggesting mixed infection, not MRSA or GAS.  Feels better and tells me he is motivated to make lifestyle changes in regards to his diabetes which he understands is a risk factor for this.   CT scan independently reviewed and noted area of gas.   Review of Systems:  Constitutional: negative for fevers and chills Gastrointestinal: negative for nausea and diarrhea All other systems reviewed and are negative   Past Medical History  Diagnosis Date  . Asthma   . Allergy     seasonal  . Diabetes mellitus without complication [facility])     Social History  Substance Use Topics  . Smoking status: Never Smoker   . Smokeless tobacco: Never Used  . Alcohol Use: No    Family History  Problem Relation Age of Onset  . Diabetes Mother   .  Hypertension Mother   . Heart disease Mother   . Diabetes Father   . Arthritis Father   . Asthma Father   . Hypertension Father   . Heart disease Father   . Diabetes Sister     Allergies  Allergen Reactions  . Amoxicillin Itching    Physical Exam: Constitutional: in no apparent distress and alert  Filed Vitals:   01/11/16 1946 01/12/16 0619  BP: 122/71 127/79  Pulse: 107 102  Temp: 99.3 F (37.4 C) 99 F (37.2 C)  Resp: 20 20   EYES: anicteric ENMT: no thrush Cardiovascular: Cor RRR and No murmurs Respiratory: CTA B; normal respiratory effort GI: Bowel sounds are normal, liver is not enlarged, spleen is not enlarged Musculoskeletal: peripheral pulses normal, no pedal edema, no clubbing or cyanosis Skin: negatives: no rash Hematologic: no cervical lad  Lab Results  Component Value Date   WBC 14.8* 01/10/2016   HGB 11.0* 01/10/2016   HCT 32.1* 01/10/2016   MCV 79.1 01/10/2016   PLT 266 01/10/2016    Lab Results  Component Value Date   CREATININE 0.71 01/12/2016   BUN 10 01/12/2016   NA 135 01/12/2016   K 5.5* 01/12/2016   CL 108 01/12/2016   CO2 16* 01/12/2016    Lab Results  Component Value Date   ALT 23 01/07/2016   AST 14* 01/07/2016   ALKPHOS 149* 01/07/2016  Microbiology: Recent Results (from the past 240 hour(s))  Blood Culture (routine x 2)     Status: None   Collection Time: 01/07/16  2:20 PM  Result Value Ref Range Status   Specimen Description BLOOD RIGHT ANTECUBITAL  Final   Special Requests IN PEDIATRIC BOTTLE 5CC  Final   Culture NO GROWTH 5 DAYS  Final   Report Status 01/12/2016 FINAL  Final  Culture, blood (Routine X 2) w Reflex to ID Panel     Status: None (Preliminary result)   Collection Time: 01/07/16  2:20 PM  Result Value Ref Range Status   Specimen Description BLOOD LEFT ANTECUBITAL  Final   Special Requests BOTTLES DRAWN AEROBIC AND ANAEROBIC 5CC  Final   Culture NO GROWTH 4 DAYS  Final   Report Status PENDING   Incomplete  Blood Culture (routine x 2)     Status: None   Collection Time: 01/07/16  2:25 PM  Result Value Ref Range Status   Specimen Description BLOOD LEFT ANTECUBITAL  Final   Special Requests BOTTLES DRAWN AEROBIC AND ANAEROBIC 5CC  Final   Culture NO GROWTH 5 DAYS  Final   Report Status 01/12/2016 FINAL  Final  Anaerobic culture     Status: None   Collection Time: 01/07/16  5:46 PM  Result Value Ref Range Status   Specimen Description ABSCESS  Final   Special Requests   Final    PERINEAL PATIENT ON FOLLOWING VANC, CLINDAMYCIN, AND MEROPENEM   Gram Stain   Final    ABUNDANT WBC PRESENT,BOTH PMN AND MONONUCLEAR NO SQUAMOUS EPITHELIAL CELLS SEEN ABUNDANT GRAM POSITIVE COCCI IN PAIRS ABUNDANT GRAM NEGATIVE RODS MODERATE GRAM POSITIVE RODS    Culture   Final    MULTIPLE ORGANISMS PRESENT, NONE PREDOMINANT Performed at [facility]    Report Status 01/12/2016 FINAL  Final  Fungus Culture with Smear     Status: None (Preliminary result)   Collection Time: 01/07/16  5:46 PM  Result Value Ref Range Status   Specimen Description ABSCESS  Final   Special Requests   Final    PERINEAL PATIENT ON FOLLOWING VANCM, CLINDAMYCIN, AND MEROPENEM   Fungal Smear   Final    NO YEAST OR FUNGAL ELEMENTS SEEN Performed at [facility]    Culture   Final    CULTURE IN PROGRESS FOR FOUR WEEKS Performed at [facility]    Report Status PENDING  Incomplete  AFB culture with smear     Status: None (Preliminary result)   Collection Time: 01/07/16  5:46 PM  Result Value Ref Range Status   Specimen Description ABSCESS  Final   Special Requests   Final    PERINEAL PATIENT ON FOLLOWING VANC, CLINDAMYCIN, AND MEROPENEM   Acid Fast Smear   Final    NO ACID FAST BACILLI SEEN Performed at [facility]    Culture   Final    CULTURE WILL BE EXAMINED FOR 6 WEEKS BEFORE ISSUING A FINAL REPORT Performed at [facility]    Report Status PENDING  Incomplete    Culture, routine-abscess     Status: None   Collection Time: 01/07/16  5:46 PM  Result Value Ref Range Status   Specimen Description ABSCESS  Final   Special Requests   Final    PERINEAL PATIENT ON FOLLOWING VANC, CLINDAMYCIN, AND MEROPENEM   Gram Stain   Final    ABUNDANT WBC PRESENT,BOTH PMN AND MONONUCLEAR NO SQUAMOUS EPITHELIAL CELLS SEEN ABUNDANT GRAM  POSITIVE COCCI IN PAIRS ABUNDANT GRAM NEGATIVE RODS MODERATE GRAM POSITIVE RODS    Culture   Final    FEW ESCHERICHIA COLI Performed at [facility]    Report Status 01/10/2016 FINAL  Final   Organism ID, Bacteria ESCHERICHIA COLI  Final      Susceptibility   Escherichia coli - MIC*    AMPICILLIN >=32 RESISTANT Resistant     AMPICILLIN/SULBACTAM 4 SENSITIVE Sensitive     CEFEPIME <=1 SENSITIVE Sensitive     CEFTAZIDIME <=1 SENSITIVE Sensitive     CEFTRIAXONE <=1 SENSITIVE Sensitive     CIPROFLOXACIN <=0.25 SENSITIVE Sensitive     GENTAMICIN <=1 SENSITIVE Sensitive     IMIPENEM <=0.25 SENSITIVE Sensitive     PIP/TAZO <=4 SENSITIVE Sensitive     TOBRAMYCIN <=1 SENSITIVE Sensitive     TRIMETH/SULFA Value in next row Resistant      >=320 RESISTANT(NOTE)    * FEW ESCHERICHIA COLI  MRSA PCR Screening     Status: None   Collection Time: 01/07/16  6:36 PM  Result Value Ref Range Status   MRSA by PCR NEGATIVE NEGATIVE Final    Comment:        The GeneXpert MRSA Assay (FDA approved for NASAL specimens only), is one component of a comprehensive MRSA colonization surveillance program. It is not intended to diagnose MRSA infection nor to guide or monitor treatment for MRSA infections.   Urine culture     Status: None   Collection Time: 01/08/16 11:15 AM  Result Value Ref Range Status   Specimen Description URINE, CATHETERIZED  Final   Special Requests NONE  Final   Culture NO GROWTH 1 DAY  Final   Report Status 01/09/2016 FINAL  Final    Latrail Pounders, Molly Maduro, MD [facility] [email or web address] [phone] pager  [phone] cell 01/12/2016, 3:50 PM

## 2016-01-12 NOTE — Care Management Note (Signed)
Case Management Note  Patient Details  Name: Shawn Harmon MRN: [id number] Date of Birth: 08-Dec-1996  Subjective/Objective:           Admitted with Fournier's gangrene         Action/Plan: Spoke with patient and then patient's mother by phone about home health, for IV antibiotics. They are willing to learn to give iv antibiotics and chose [facility]. Contacted Pam with [facility], left message. CM will continue to follow.   Expected Discharge Date:                  Expected Discharge Plan:  Home w Home Health Services  In-House Referral:     Discharge planning Services  CM Consult  Post Acute Care Choice:  Home Health Choice offered to:  Patient, Parent  DME Arranged:    DME Agency:     HH Arranged:  IV Antibiotics, RN HH Agency:  [facility]  Status of Service:  In process, will continue to follow  Medicare Important Message Given:    Date Medicare IM Given:    Medicare IM give by:    Date Additional Medicare IM Given:    Additional Medicare Important Message give by:     If discussed at Long Length of Stay Meetings, dates discussed:    Additional Comments:  Monica BectonKrieg, [name], RN 01/12/2016, 5:13 PM

## 2016-01-12 NOTE — Progress Notes (Signed)
PROGRESS NOTE    Shawn RoyalsCaleb M Harmon ZOX:[id number] DOB: [birth year] DOA: 01/07/2016 PCP: Remus LofflerJones, Angel S, PA-C  HPI/Brief narrative 20 year old male with history of DM 2 with noncompliance due to lack of insurance, presented with DKA due to Fournier's gangrene. He was taken to or for emergent debridement on 1/11 & 1/12. He returned to the ICU on a ventilator. DKA resolved. Was under CCM care and transferred to floor and TRH on 01/12/16. General surgery following.   Assessment/Plan:   Sepsis secondary to Fournier's gangrene - Status post or debridement on 1/11 & 1/12 - Sepsis physiology resolved. - Gen. surgery follow-up appreciated: PT hydrotherapy for fibrinous exudate, don't think additional OR debridement needed & recommend ID consultation for antibiotic recommendations. - Abscess culture 01/10/16: Escherichia coli which is pansensitive except to Bactrim and ampicillin. No AFB seen. Anaerobic culture shows multiple organisms. No yeast or fungal elements. MRSA PCR negative. Blood cultures 2 and urine culture: Negative -Currently on Primaxin. - Wound seems to be improving per [facility] note  DKA in type II DM, not at goal - History of noncompliance with medications due to lack of insurance. - Mother at bedside states that they will follow-up with PCP who will arrange outpatient endocrinology consultation. - Treated per DKA protocol in ICU. DKA resolved. Off insulin drip. Transitioned to Lantus and SSI. - We will adjust insulin's. Diabetes coordinator consultation. Patient will likely DC on insulin's. -  A1c: 15.5.  VDRF - Resolved.  Asthma - Stable.  Acute kidney injury - Resolved.  Non-anion gap metabolic acidosis - May be secondary to ongoing mild diarrhea. Initially felt to be secondary to hyperchloremia but that has resolved. Start on oral bicarbonate 650 MG 3 times a day and follow BMP.  Mild hyperkalemia/hypokalemia - Likely secondary to overcorrection of hypokalemia on 1/15.  Follow BMP in a.m.  Acute encephalopathy - Resolved  Anemia  - Stable.    DVT prophylaxis: Subcutaneous heparin Code Status: Full Family Communication: Discussed with patient's mother at bedside on 1/16 Disposition Plan: DC home when medically stable.   Consultants:  General surgery   CCM-signed off  Infectious disease-pending  Procedures:  Debridement of Fournier's gangrene on 1/11 & 1/12    VDRF  Antimicrobials:  IV clindamycin 1/11 > 1/14    IV Primaxin 1/11 >  IV meropenem 1 dose  IV vancomycin 1/11 > 1/14  Subjective: Overall feels better. Has up to 3 loose stools per day. Denies any other complaints. Perineal dressing change this morning by surgery.   Objective: Filed Vitals:   01/11/16 1500 01/11/16 1512 01/11/16 1946 01/12/16 0619  BP: 112/66  122/71 127/79  Pulse: 100  107 102  Temp:  98.1 F (36.7 C) 99.3 F (37.4 C) 99 F (37.2 C)  TempSrc:  Oral Oral Oral  Resp: 32  20 20  Height:      Weight:      SpO2:   93% 96%    Intake/Output Summary (Last 24 hours) at 01/12/16 1250 Last data filed at 01/12/16 0800  Gross per 24 hour  Intake    270 ml  Output   1300 ml  Net  -1030 ml   Filed Weights   01/07/16 1900 01/09/16 0400  Weight: 92.5 kg (203 lb 14.8 oz) 98.6 kg (217 lb 6 oz)    Exam:  General exam: Pleasant young male lying comfortably in bed. Mother at bedside.  Respiratory system: Clear. No increased work of breathing. Cardiovascular system: S1 & S2 heard, RRR. No  JVD, murmurs, gallops, clicks or pedal edema. Gastrointestinal system: Abdomen is nondistended, soft and nontender. Normal bowel sounds heard. Central nervous system: Alert and oriented. No focal neurological deficits. Extremities: Symmetric 5 x 5 power.   Data Reviewed: Basic Metabolic Panel:  Recent Labs Lab 01/08/16 0050  01/09/16 0410 01/09/16 1200 01/10/16 0437 01/11/16 1858 01/12/16 0535  NA 139  < > 138 138 139 138 135  K 2.5*  < > 3.5 3.4* 2.9*  3.9 5.5*  CL 114*  < > 117* 117* 113* 106 108  CO2 14*  < > 13* 14* 16* 20* 16*  GLUCOSE 240*  < > 216* 222* 172* 272* 224*  BUN 11  < > 17 15 6 9 10   CREATININE 0.86  < > 0.80 0.76 0.75 0.77 0.71  CALCIUM 7.6*  < > 8.2* 8.0* 7.8* 8.5* 8.0*  MG 2.0  --  1.9  --  1.6*  --   --   PHOS <1.0*  --  <1.0*  --  1.9*  --   --   < > = values in this interval not displayed. Liver Function Tests:  Recent Labs Lab 01/07/16 1227  AST 14*  ALT 23  ALKPHOS 149*  BILITOT 1.8*  PROT 8.0  ALBUMIN 3.2*   No results for input(s): LIPASE, AMYLASE in the last 168 hours. No results for input(s): AMMONIA in the last 168 hours. CBC:  Recent Labs Lab 01/07/16 1227  01/08/16 0050 01/08/16 1330 01/08/16 1335 01/08/16 1345 01/09/16 0410 01/10/16 0437  WBC 25.1*  --  13.4*  --   --   --  15.1* 14.8*  HGB 16.5  < > 10.8* 10.9* 11.6* 10.2* 10.6* 11.0*  HCT 48.0  < > 31.3* 32.0* 34.0* 30.0* 30.5* 32.1*  MCV 80.9  --  78.3  --   --   --  78.0 79.1  PLT 278  --  188  --   --   --  234 266  < > = values in this interval not displayed. Cardiac Enzymes:  Recent Labs Lab 01/07/16 2055  TROPONINI 0.03   BNP (last 3 results) No results for input(s): PROBNP in the last 8760 hours. CBG:  Recent Labs Lab 01/11/16 1511 01/11/16 1646 01/11/16 2136 01/12/16 0622 01/12/16 1135  GLUCAP 207* 240* 280* 229* 231*    Recent Results (from the past 240 hour(s))  Blood Culture (routine x 2)     Status: None   Collection Time: 01/07/16  2:20 PM  Result Value Ref Range Status   Specimen Description BLOOD RIGHT ANTECUBITAL  Final   Special Requests IN PEDIATRIC BOTTLE 5CC  Final   Culture NO GROWTH 5 DAYS  Final   Report Status 01/12/2016 FINAL  Final  Culture, blood (Routine X 2) w Reflex to ID Panel     Status: None (Preliminary result)   Collection Time: 01/07/16  2:20 PM  Result Value Ref Range Status   Specimen Description BLOOD LEFT ANTECUBITAL  Final   Special Requests BOTTLES DRAWN AEROBIC AND  ANAEROBIC 5CC  Final   Culture NO GROWTH 4 DAYS  Final   Report Status PENDING  Incomplete  Blood Culture (routine x 2)     Status: None   Collection Time: 01/07/16  2:25 PM  Result Value Ref Range Status   Specimen Description BLOOD LEFT ANTECUBITAL  Final   Special Requests BOTTLES DRAWN AEROBIC AND ANAEROBIC 5CC  Final   Culture NO GROWTH 5 DAYS  Final  Report Status 01/12/2016 FINAL  Final  Anaerobic culture     Status: None   Collection Time: 01/07/16  5:46 PM  Result Value Ref Range Status   Specimen Description ABSCESS  Final   Special Requests   Final    PERINEAL PATIENT ON FOLLOWING VANC, CLINDAMYCIN, AND MEROPENEM   Gram Stain   Final    ABUNDANT WBC PRESENT,BOTH PMN AND MONONUCLEAR NO SQUAMOUS EPITHELIAL CELLS SEEN ABUNDANT GRAM POSITIVE COCCI IN PAIRS ABUNDANT GRAM NEGATIVE RODS MODERATE GRAM POSITIVE RODS    Culture   Final    MULTIPLE ORGANISMS PRESENT, NONE PREDOMINANT Performed at [facility]    Report Status 01/12/2016 FINAL  Final  Fungus Culture with Smear     Status: None (Preliminary result)   Collection Time: 01/07/16  5:46 PM  Result Value Ref Range Status   Specimen Description ABSCESS  Final   Special Requests   Final    PERINEAL PATIENT ON FOLLOWING VANCM, CLINDAMYCIN, AND MEROPENEM   Fungal Smear   Final    NO YEAST OR FUNGAL ELEMENTS SEEN Performed at [facility]    Culture   Final    CULTURE IN PROGRESS FOR FOUR WEEKS Performed at [facility]    Report Status PENDING  Incomplete  AFB culture with smear     Status: None (Preliminary result)   Collection Time: 01/07/16  5:46 PM  Result Value Ref Range Status   Specimen Description ABSCESS  Final   Special Requests   Final    PERINEAL PATIENT ON FOLLOWING VANC, CLINDAMYCIN, AND MEROPENEM   Acid Fast Smear   Final    NO ACID FAST BACILLI SEEN Performed at [facility]    Culture   Final    CULTURE WILL BE EXAMINED FOR 6 WEEKS BEFORE ISSUING A FINAL  REPORT Performed at [facility]    Report Status PENDING  Incomplete  Culture, routine-abscess     Status: None   Collection Time: 01/07/16  5:46 PM  Result Value Ref Range Status   Specimen Description ABSCESS  Final   Special Requests   Final    PERINEAL PATIENT ON FOLLOWING VANC, CLINDAMYCIN, AND MEROPENEM   Gram Stain   Final    ABUNDANT WBC PRESENT,BOTH PMN AND MONONUCLEAR NO SQUAMOUS EPITHELIAL CELLS SEEN ABUNDANT GRAM POSITIVE COCCI IN PAIRS ABUNDANT GRAM NEGATIVE RODS MODERATE GRAM POSITIVE RODS    Culture   Final    FEW ESCHERICHIA COLI Performed at [facility]    Report Status 01/10/2016 FINAL  Final   Organism ID, Bacteria ESCHERICHIA COLI  Final      Susceptibility   Escherichia coli - MIC*    AMPICILLIN >=32 RESISTANT Resistant     AMPICILLIN/SULBACTAM 4 SENSITIVE Sensitive     CEFEPIME <=1 SENSITIVE Sensitive     CEFTAZIDIME <=1 SENSITIVE Sensitive     CEFTRIAXONE <=1 SENSITIVE Sensitive     CIPROFLOXACIN <=0.25 SENSITIVE Sensitive     GENTAMICIN <=1 SENSITIVE Sensitive     IMIPENEM <=0.25 SENSITIVE Sensitive     PIP/TAZO <=4 SENSITIVE Sensitive     TOBRAMYCIN <=1 SENSITIVE Sensitive     TRIMETH/SULFA Value in next row Resistant      >=320 RESISTANT(NOTE)    * FEW ESCHERICHIA COLI  MRSA PCR Screening     Status: None   Collection Time: 01/07/16  6:36 PM  Result Value Ref Range Status   MRSA by PCR NEGATIVE NEGATIVE Final    Comment:  The GeneXpert MRSA Assay (FDA approved for NASAL specimens only), is one component of a comprehensive MRSA colonization surveillance program. It is not intended to diagnose MRSA infection nor to guide or monitor treatment for MRSA infections.   Urine culture     Status: None   Collection Time: 01/08/16 11:15 AM  Result Value Ref Range Status   Specimen Description URINE, CATHETERIZED  Final   Special Requests NONE  Final   Culture NO GROWTH 1 DAY  Final   Report Status 01/09/2016 FINAL   Final         Studies: No results found.      Scheduled Meds: . heparin  5,000 Units Subcutaneous 3 times per day  . imipenem-cilastatin  500 mg Intravenous Q6H  . insulin aspart  0-15 Units Subcutaneous TID WC  . insulin aspart  0-5 Units Subcutaneous QHS  . insulin glargine  20 Units Subcutaneous Q24H  . sodium bicarbonate  650 mg Oral TID   Continuous Infusions: . lactated ringers 10 mL/hr at 01/10/16 1800    Active Problems:   Fournier's gangrene    Time spent: 40 minutes.    Marcellus Scott, MD, FACP, FHM. [facility] Pager [phone]  If 7PM-7AM, please contact night-coverage [email or web address] Password [email or web address] 01/12/2016, 12:50 PM    LOS: 5 days

## 2016-01-12 NOTE — Progress Notes (Signed)
Physical Therapy Wound Treatment Patient Details  Name: Shawn Harmon MRN: [id number] Date of Birth: 1996-05-12  Today's Date: 01/12/2016 Time: 4575-4862 Time Calculation (min): 47 min  Subjective  Subjective: "It started like a bump." Patient and Family Stated Goals: Everything to heal and look normal.   Date of Onset: 01/07/16 (Date of first I+D) Prior Treatments: 2 I+Ds  Pain Score:  8/10 during treatment.  Pre-medicated with IV pain meds.  Wound Assessment  Wound / Incision (Open or Dehisced) 01/12/16 Incision - Open Perineum Other (Comment) I+D incisions from Bil groin posterior to rectum (Active)  Dressing Type ABD;Gauze (Comment);Mesh briefs;Moist to dry 01/12/2016  2:00 PM  Dressing Changed Changed 01/12/2016  2:00 PM  Dressing Status Clean;Dry;Intact 01/12/2016  2:00 PM  Dressing Change Frequency Twice a day 01/12/2016  2:00 PM  Site / Wound Assessment Granulation tissue;Painful;Pink;Yellow 01/12/2016  2:00 PM  % Wound base Red or Granulating 60% 01/12/2016  2:00 PM  % Wound base Yellow 40% 01/12/2016  2:00 PM  Peri-wound Assessment Edema;Maceration 01/12/2016  2:00 PM  Wound Length (cm) 20.8 cm 01/12/2016  2:00 PM  Wound Width (cm) 9.1 cm 01/12/2016  2:00 PM  Wound Depth (cm) 5.7 cm 01/12/2016  2:00 PM  Margins Unattached edges (unapproximated) 01/12/2016  2:00 PM  Closure None 01/12/2016  2:00 PM  Drainage Amount Copious 01/12/2016  2:00 PM  Drainage Description Serosanguineous 01/12/2016  2:00 PM  Non-staged Wound Description Not applicable 01/12/2016  2:00 PM  Treatment Cleansed;Debridement (Selective);Hydrotherapy (Pulse lavage);Packing (Saline gauze) 01/12/2016  2:00 PM     Incision (Closed) 01/07/16 Perineum (Active)  Dressing Type Gauze (Comment);Moist to dry 01/11/2016  8:53 PM  Dressing Changed 01/11/2016  5:30 PM  Dressing Change Frequency Twice a day 01/11/2016  5:00 PM  Site / Wound Assessment Dressing in place / Unable to assess 01/11/2016  8:53 PM  Margins Unattached edges  (unapproximated) 01/11/2016  5:00 AM  Closure None 01/11/2016  5:00 AM  Drainage Amount Minimal 01/11/2016  5:00 AM  Drainage Description Serous 01/11/2016  5:00 AM  Treatment Cleansed 01/11/2016  5:00 AM     Incision (Closed) 01/08/16 Perineum Other (Comment) (Active)  Dressing Type ABD;Gauze (Comment);Moist to dry;Other (Comment) 01/09/2016  8:00 AM   Hydrotherapy Pulsed lavage therapy - wound location: Perineum Pulsed Lavage with Suction (psi): 4 psi Pulsed Lavage with Suction - Normal Saline Used: 1000 mL Pulsed Lavage Tip: Tip with splash shield Selective Debridement Selective Debridement - Location: Perineum Selective Debridement - Tools Used: Forceps;Scissors Selective Debridement - Tissue Removed: Yellow slough   Wound Assessment and Plan  Wound Therapy - Assess/Plan/Recommendations Wound Therapy - Clinical Statement: pt with poor management of DM affecting healing properties and will need further education on dressing changes and healing.   Wound Therapy - Functional Problem List: Poor DM management, opening draining wound, hx medical non-compliance. Factors Delaying/Impairing Wound Healing: Diabetes Mellitus;Infection - systemic/local;Polypharmacy Hydrotherapy Plan: Debridement;Dressing change;Patient/family education;Pulsatile lavage with suction Wound Therapy - Frequency: 6X / week Wound Therapy - Follow Up Recommendations: Other (comment) (Per MD) Wound Plan: See Above.  Wound Therapy Goals- Improve the function of patient's integumentary system by progressing the wound(s) through the phases of wound healing (inflammation - proliferation - remodeling) by: Decrease Necrotic Tissue to: 20 Decrease Necrotic Tissue - Progress: Goal set today Increase Granulation Tissue to: 80 Increase Granulation Tissue - Progress: Goal set today Improve Drainage Characteristics: Min Improve Drainage Characteristics - Progress: Goal set today Patient/Family will be able to : Perform own  dressing  changes.   Patient/Family Instruction Goal - Progress: Goal set today Goals/treatment plan/discharge plan were made with and agreed upon by patient/family: Yes Time For Goal Achievement: 7 days Wound Therapy - Potential for Goals: Good  Goals will be updated until maximal potential achieved or discharge criteria met.  Discharge criteria: when goals achieved, discharge from hospital, MD decision/surgical intervention, no progress towards goals, refusal/missing three consecutive treatments without notification or medical reason.  GP     Danissa Rundle, [place], [place] 01/12/2016, 3:03 PM

## 2016-01-12 NOTE — Progress Notes (Signed)
Patient has orders for dressing to be done BID. Last done at 1700 hour. Patient has been up most of the night, so this RN asked him if we could complete his dressing change at this time or in a short while. Patient states "can we do it this morning after breakfast?" This RN states that orders indicate it to be twice a day and would like to do it on this shift also so day shift can do it one time. Patient still wants it done first today after breakfast. RN emphasizes to make sure dressing done twice in day then. Will pass info to oncoming day RN.

## 2016-01-13 DIAGNOSIS — E876 Hypokalemia: Secondary | ICD-10-CM

## 2016-01-13 LAB — COMPREHENSIVE METABOLIC PANEL
ALBUMIN: 2.2 g/dL — AB (ref 3.5–5.0)
ALT: 21 U/L (ref 17–63)
AST: 18 U/L (ref 15–41)
Alkaline Phosphatase: 79 U/L (ref 38–126)
Anion gap: 9 (ref 5–15)
CHLORIDE: 105 mmol/L (ref 101–111)
CO2: 24 mmol/L (ref 22–32)
Calcium: 8.4 mg/dL — ABNORMAL LOW (ref 8.9–10.3)
Creatinine, Ser: 0.63 mg/dL (ref 0.61–1.24)
GFR calc Af Amer: >60 mL/min (ref >60)
Glucose, Bld: 242 mg/dL — ABNORMAL HIGH (ref 65–99)
POTASSIUM: 3.1 mmol/L — AB (ref 3.5–5.1)
SODIUM: 138 mmol/L (ref 135–145)
Total Bilirubin: 0.6 mg/dL (ref 0.3–1.2)
Total Protein: 5.8 g/dL — ABNORMAL LOW (ref 6.5–8.1)

## 2016-01-13 LAB — GLUCOSE, CAPILLARY
GLUCOSE-CAPILLARY: 212 mg/dL — AB (ref 65–99)
GLUCOSE-CAPILLARY: 215 mg/dL — AB (ref 65–99)
GLUCOSE-CAPILLARY: 229 mg/dL — AB (ref 65–99)
GLUCOSE-CAPILLARY: 198 mg/dL — AB (ref 65–99)

## 2016-01-13 LAB — CBC
HEMATOCRIT: 34.7 % — AB (ref 39.0–52.0)
HEMOGLOBIN: 11.6 g/dL — AB (ref 13.0–17.0)
MCH: 27.1 pg (ref 26.0–34.0)
MCHC: 33.4 g/dL (ref 30.0–36.0)
MCV: 81.1 fL (ref 78.0–100.0)
Platelets: 343 10*3/uL (ref 150–400)
RBC: 4.28 MIL/uL (ref 4.22–5.81)
RDW: 14.1 % (ref 11.5–15.5)
WBC: 10.3 10*3/uL (ref 4.0–10.5)

## 2016-01-13 LAB — PHOSPHORUS: PHOSPHORUS: 3.5 mg/dL (ref 2.5–4.6)

## 2016-01-13 LAB — MAGNESIUM: MAGNESIUM: 1.7 mg/dL (ref 1.7–2.4)

## 2016-01-13 LAB — HIV ANTIBODY (ROUTINE TESTING W REFLEX): HIV Screen 4th Generation wRfx: NONREACTIVE

## 2016-01-13 LAB — CULTURE, BLOOD (ROUTINE X 2): Culture: NO GROWTH

## 2016-01-13 MED ORDER — INSULIN GLARGINE 100 UNIT/ML ~~LOC~~ SOLN
30.0000 [IU] | SUBCUTANEOUS | Status: DC
  Administered 2016-01-14: 30 [IU] via SUBCUTANEOUS
  Filled 2016-01-13: qty 0.3

## 2016-01-13 MED ORDER — SODIUM CHLORIDE 0.9 % IJ SOLN
10.0000 mL | INTRAMUSCULAR | Status: DC | PRN
  Administered 2016-01-14 (×2): 10 mL
  Filled 2016-01-13 (×2): qty 40

## 2016-01-13 MED ORDER — INSULIN GLARGINE 100 UNIT/ML ~~LOC~~ SOLN
5.0000 [IU] | Freq: Once | SUBCUTANEOUS | Status: AC
  Administered 2016-01-13: 5 [IU] via SUBCUTANEOUS
  Filled 2016-01-13: qty 0.05

## 2016-01-13 MED ORDER — POTASSIUM CHLORIDE CRYS ER 20 MEQ PO TBCR
40.0000 meq | EXTENDED_RELEASE_TABLET | Freq: Once | ORAL | Status: AC
  Administered 2016-01-13: 40 meq via ORAL
  Filled 2016-01-13: qty 2

## 2016-01-13 NOTE — Progress Notes (Signed)
Peripherally Inserted Central Catheter/Midline Placement  The IV Nurse has discussed with the patient and/or persons authorized to consent for the patient, the purpose of this procedure and the potential benefits and risks involved with this procedure.  The benefits include less needle sticks, lab draws from the catheter and patient may be discharged home with the catheter.  Risks include, but not limited to, infection, bleeding, blood clot (thrombus formation), and puncture of an artery; nerve damage and irregular heat beat.  Alternatives to this procedure were also discussed.  PICC/Midline Placement Documentation        Shawn Harmon, Shawn Harmon 01/13/2016, 9:29 PM

## 2016-01-13 NOTE — Progress Notes (Signed)
Patient ID: Shawn Harmon, male   DOB: 09-30-1996, 20 y.o.   MRN: [id number] 5 Days Post-Op  Subjective: Pt doing well.  No new complaints  Objective: Vital signs in last 24 hours: Temp:  [98.1 F (36.7 C)-99.3 F (37.4 C)] 98.7 F (37.1 C) (01/17 0446) Pulse Rate:  [89-108] 108 (01/17 0446) Resp:  [18] 18 (01/17 0446) BP: (118-136)/(68-88) 136/88 mmHg (01/17 0446) SpO2:  [96 %-97 %] 96 % (01/17 0446) Last BM Date: 01/13/16  Intake/Output from previous day: 01/16 0701 - 01/17 0700 In: 1480 [P.O.:880; IV Piggyback:600] Out: -  Intake/Output this shift:    PE: Skin: wound is mostly clean, but still has some necrotic adipose tissue and thin fibrin present.  Testicles are clean  Lab Results:   Recent Labs  01/13/16 0635  WBC 10.3  HGB 11.6*  HCT 34.7*  PLT 343   BMET  Recent Labs  01/12/16 0535 01/13/16 0635  NA 135 138  K 5.5* 3.1*  CL 108 105  CO2 16* 24  GLUCOSE 224* 242*  BUN 10 <5*  CREATININE 0.71 0.63  CALCIUM 8.0* 8.4*   PT/INR No results for input(s): LABPROT, INR in the last 72 hours. CMP     Component Value Date/Time   NA 138 01/13/2016 0635   K 3.1* 01/13/2016 0635   CL 105 01/13/2016 0635   CO2 24 01/13/2016 0635   GLUCOSE 242* 01/13/2016 0635   BUN <5* 01/13/2016 0635   CREATININE 0.63 01/13/2016 0635   CALCIUM 8.4* 01/13/2016 0635   PROT 5.8* 01/13/2016 0635   ALBUMIN 2.2* 01/13/2016 0635   AST 18 01/13/2016 0635   ALT 21 01/13/2016 0635   ALKPHOS 79 01/13/2016 0635   BILITOT 0.6 01/13/2016 0635   GFRNONAA >60 01/13/2016 0635   GFRAA >60 01/13/2016 0635   Lipase  No results found for: LIPASE     Studies/Results: No results found.  Anti-infectives: Anti-infectives    Start     Dose/Rate Route Frequency Ordered Stop   01/08/16 0400  vancomycin (VANCOCIN) 1,250 mg in sodium chloride 0.9 % 250 mL IVPB  Status:  Discontinued     1,250 mg 166.7 mL/hr over 90 Minutes Intravenous Every 8 hours 01/07/16 1923 01/10/16 1544   01/08/16 0100  clindamycin (CLEOCIN) IVPB 900 mg  Status:  Discontinued     900 mg 100 mL/hr over 30 Minutes Intravenous Every 8 hours 01/07/16 1923 01/11/16 0643   01/07/16 1930  vancomycin (VANCOCIN) 1,500 mg in sodium chloride 0.9 % 500 mL IVPB     1,500 mg 250 mL/hr over 120 Minutes Intravenous  Once 01/07/16 1923 01/07/16 2230   01/07/16 1930  imipenem-cilastatin (PRIMAXIN) 500 mg in sodium chloride 0.9 % 100 mL IVPB     500 mg 200 mL/hr over 30 Minutes Intravenous Every 6 hours 01/07/16 1923     01/07/16 1515  meropenem (MERREM) 1 g in sodium chloride 0.9 % 100 mL IVPB  Status:  Discontinued     1 g 200 mL/hr over 30 Minutes Intravenous 3 times per day 01/07/16 1502 01/07/16 1923   01/07/16 1345  vancomycin (VANCOCIN) IVPB 1000 mg/200 mL premix     1,000 mg 200 mL/hr over 60 Minutes Intravenous  Once 01/07/16 1343 01/07/16 1518   01/07/16 1345  clindamycin (CLEOCIN) IVPB 900 mg     900 mg 100 mL/hr over 30 Minutes Intravenous  Once 01/07/16 1343 01/07/16 1708       Assessment/Plan  POD 6,5 s/p incision and  debridement of perineum and testicles for fournier's gangrene -cont PT hydrotherapy to clean wound up.  Once this is a little bit cleaner, he will be stable for dc home -appreciate ID recs -will try to contact [facility] today to see if I can arrange outpatient follow up with urological plastics.   LOS: 6 days    Dwon Sky E 01/13/2016, 9:14 AM Pager: [phone]

## 2016-01-13 NOTE — Progress Notes (Signed)
Physical Therapy Wound Treatment Patient Details  Name: Shawn Harmon MRN: [id number] Date of Birth: 01-Oct-1996  Today's Date: 01/13/2016 Time: 6599-3570 Time Calculation (min): 38 min  Subjective  Subjective: Pt asking if he could take a shower when he gets home. Patient and Family Stated Goals: Everything to heal and look normal.   Date of Onset: 01/07/16 (Date of first I+D) Prior Treatments: 2 I+Ds  Pain Score: Pain Score: Pt premedicated with IV pain meds.  Wound Assessment  Wound / Incision (Open or Dehisced) 01/12/16 Incision - Open Perineum Other (Comment) I+D incisions from Bil groin posterior to rectum (Active)  Dressing Type ABD;Gauze (Comment);Mesh briefs;Moist to dry;Impregnated gauze (petrolatum) 01/13/2016  9:25 AM  Dressing Changed Changed 01/13/2016  9:25 AM  Dressing Status Clean;Dry;Intact 01/13/2016  9:25 AM  Dressing Change Frequency Twice a day 01/13/2016  9:25 AM  Site / Wound Assessment Granulation tissue;Painful;Pink;Yellow 01/13/2016  9:25 AM  % Wound base Red or Granulating 60% 01/13/2016  9:25 AM  % Wound base Yellow 40% 01/13/2016  9:25 AM  % Wound base Black 0% 01/13/2016  9:25 AM  % Wound base Other (Comment) 0% 01/13/2016  9:25 AM  Peri-wound Assessment Edema;Maceration;Erythema (blanchable) 01/13/2016  9:25 AM  Wound Length (cm) 20.8 cm 01/12/2016  2:00 PM  Wound Width (cm) 9.1 cm 01/12/2016  2:00 PM  Wound Depth (cm) 5.7 cm 01/12/2016  2:00 PM  Margins Unattached edges (unapproximated) 01/13/2016  9:25 AM  Closure None 01/13/2016  9:25 AM  Drainage Amount Moderate 01/13/2016  9:25 AM  Drainage Description Serosanguineous 01/13/2016  9:25 AM  Non-staged Wound Description Not applicable 1/77/9390  3:00 AM  Treatment Debridement (Selective);Hydrotherapy (Pulse lavage);Packing (Saline gauze) 01/13/2016  9:25 AM   Hydrotherapy Pulsed lavage therapy - wound location: Perineum Pulsed Lavage with Suction (psi): 4 psi (4-8) Pulsed Lavage with Suction - Normal Saline  Used: 1000 mL Pulsed Lavage Tip: Tip with splash shield Selective Debridement Selective Debridement - Location: Perineum Selective Debridement - Tools Used: Forceps;Scissors Selective Debridement - Tissue Removed: Yellow slough   Wound Assessment and Plan  Wound Therapy - Assess/Plan/Recommendations Wound Therapy - Clinical Statement: Pt tolerating hydrotherapy with pain meds given prior to treatment. Drainage appears to have lessened some. Wound Therapy - Functional Problem List: Poor DM management, opening draining wound, hx medical non-compliance. Factors Delaying/Impairing Wound Healing: Diabetes Mellitus;Infection - systemic/local;Polypharmacy Hydrotherapy Plan: Debridement;Dressing change;Patient/family education;Pulsatile lavage with suction Wound Therapy - Frequency: 6X / week Wound Therapy - Follow Up Recommendations: Other (comment) (Per MD) Wound Plan: See Above.  Wound Therapy Goals- Improve the function of patient's integumentary system by progressing the wound(s) through the phases of wound healing (inflammation - proliferation - remodeling) by: Decrease Necrotic Tissue to: 20 Decrease Necrotic Tissue - Progress: Progressing toward goal Increase Granulation Tissue to: 80 Increase Granulation Tissue - Progress: Progressing toward goal Improve Drainage Characteristics: Min Improve Drainage Characteristics - Progress: Progressing toward goal Patient/Family will be able to : Perform own dressing changes.   Patient/Family Instruction Goal - Progress: Progressing toward goal Goals/treatment plan/discharge plan were made with and agreed upon by patient/family: Yes Time For Goal Achievement: 7 days Wound Therapy - Potential for Goals: Good  Goals will be updated until maximal potential achieved or discharge criteria met.  Discharge criteria: when goals achieved, discharge from hospital, MD decision/surgical intervention, no progress towards goals, refusal/missing three  consecutive treatments without notification or medical reason.  GP     [name] 01/13/2016, 9:33 AM [facility] [phone]

## 2016-01-13 NOTE — Progress Notes (Signed)
PROGRESS NOTE    CLAYBORNE DIVIS ZOX:[id number] DOB: Apr 20, 1996 DOA: 01/07/2016 PCP: Remus Loffler, PA-C  HPI/Brief narrative 20 year old male with history of DM 2 with noncompliance due to lack of insurance, presented with DKA due to Fournier's gangrene. He was taken to or for emergent debridement on 1/11 & 1/12. He returned to the ICU on a ventilator. DKA resolved. Was under CCM care and transferred to floor and TRH on 01/12/16. General surgery following. ID consulted.   Assessment/Plan:   Sepsis secondary to Fournier's gangrene - Status post OR debridement on 1/11 & 1/12 - Sepsis physiology resolved. - Gen. surgery follow-up appreciated: PT hydrotherapy for fibrinous exudate, don't think additional OR debridement needed & recommend ID consultation for antibiotic recommendations. - Abscess culture 01/10/16: Escherichia coli which is pansensitive except to Bactrim and ampicillin. No AFB seen. Anaerobic culture shows multiple organisms. No yeast or fungal elements. MRSA PCR negative. Blood cultures 2 and urine culture: Negative - Currently on Primaxin. - Wound seems to be improving per [facility] note - Infectious disease recommend IV ertapenem for 2 more weeks at discharge. An alternative to ertapenem is ceftriaxone 2 g daily with oral Flagyl 500 MG 3 times a day. PICC line was requested.  DKA in type II DM, not at goal - History of noncompliance with medications due to lack of insurance. - Mother at bedside states that they will follow-up with PCP who will arrange outpatient endocrinology consultation. - Treated per DKA protocol in ICU. DKA resolved. Off insulin drip. Transitioned to Lantus and SSI. - We will adjust insulin's. Diabetes coordinator consultation appreciated-at time of discharge please order Lantus flex pen/NovoLog flex pen/metformin/insulin pen needles.  - Patient states that he used to be on Lantus and NovoLog but PCP discontinued Lantus due to improved control and was only on  NovoLog PTA. -  A1c: 15.5.  VDRF - Resolved.  Asthma - Stable.  Acute kidney injury - Resolved.  Non-anion gap metabolic acidosis - May be secondary to ongoing mild diarrhea. Initially felt to be secondary to hyperchloremia but that has resolved. Started on oral bicarbonate 650 MG 3 times a day - Bicarbonate has improved from 16 > 24. Follow BMP and may consider reducing bicarbonate dose or discontinuing at discharge depending on progress.  Mild hyperkalemia/hypokalemia - Hypokalemic at 3.1 on 1/17. Replace and follow.  Acute encephalopathy - Resolved  Anemia  - Stable.    DVT prophylaxis: Subcutaneous heparin Code Status: Full Family Communication: None at bedside today. Disposition Plan: DC home when medically stable.   Consultants:  General surgery   CCM-signed off  Infectious disease-pending  Procedures:  Debridement of Fournier's gangrene on 1/11 & 1/12   VDRF-resolved.  Antimicrobials:  IV clindamycin 1/11 > 1/14    IV Primaxin 1/11 >  IV meropenem 1 dose  IV vancomycin 1/11 > 1/14  Subjective: No new complaints reported. Patient examined while PT were performing hydrotherapy in room.  Objective: Filed Vitals:   01/12/16 0619 01/12/16 1600 01/12/16 2131 01/13/16 0446  BP: 127/79 118/68 129/82 136/88  Pulse: 102 99 89 108  Temp: 99 F (37.2 C) 98.1 F (36.7 C) 99.3 F (37.4 C) 98.7 F (37.1 C)  TempSrc: Oral Oral Oral Oral  Resp: Height:      Weight:      SpO2: 96% 96% 97% 96%    Intake/Output Summary (Last 24 hours) at 01/13/16 1329 Last data filed at 01/13/16 1230  Gross per 24 hour  Intake   1850 ml  Output      0 ml  Net   1850 ml   Filed Weights   01/07/16 1900 01/09/16 0400  Weight: 92.5 kg (203 lb 14.8 oz) 98.6 kg (217 lb 6 oz)    Exam:  General exam: Pleasant young male lying comfortably in bed and undergoing hydrotherapy. Respiratory system: Clear. No increased work of breathing. Cardiovascular  system: S1 & S2 heard, RRR. No JVD, murmurs, gallops, clicks or pedal edema. Gastrointestinal system: Abdomen is nondistended, soft and nontender. Normal bowel sounds heard. Central nervous system: Alert and oriented. No focal neurological deficits. Extremities: Symmetric 5 x 5 power.   Data Reviewed: Basic Metabolic Panel:  Recent Labs Lab 01/08/16 0050  01/09/16 0410 01/09/16 1200 01/10/16 0437 01/11/16 1858 01/12/16 0535 01/13/16 0635  NA 139  < > 138 138 139 138 135 138  K 2.5*  < > 3.5 3.4* 2.9* 3.9 5.5* 3.1*  CL 114*  < > 117* 117* 113* 106 108 105  CO2 14*  < > 13* 14* 16* 20* 16* 24  GLUCOSE 240*  < > 216* 222* 172* 272* 224* 242*  BUN 11  < > <5*  CREATININE 0.86  < > 0.80 0.76 0.75 0.77 0.71 0.63  CALCIUM 7.6*  < > 8.2* 8.0* 7.8* 8.5* 8.0* 8.4*  MG 2.0  --  1.9  --  1.6*  --   --  1.7  PHOS <1.0*  --  <1.0*  --  1.9*  --   --  3.5  < > = values in this interval not displayed. Liver Function Tests:  Recent Labs Lab 01/07/16 1227 01/13/16 0635  AST 14* 18  ALT 23 21  ALKPHOS 149* 79  BILITOT 1.8* 0.6  PROT 8.0 5.8*  ALBUMIN 3.2* 2.2*   No results for input(s): LIPASE, AMYLASE in the last 168 hours. No results for input(s): AMMONIA in the last 168 hours. CBC:  Recent Labs Lab 01/07/16 1227  01/08/16 0050  01/08/16 1335 01/08/16 1345 01/09/16 0410 01/10/16 0437 01/13/16 0635  WBC 25.1*  --  13.4*  --   --   --  15.1* 14.8* 10.3  HGB 16.5  < > 10.8*  < > 11.6* 10.2* 10.6* 11.0* 11.6*  HCT 48.0  < > 31.3*  < > 34.0* 30.0* 30.5* 32.1* 34.7*  MCV 80.9  --  78.3  --   --   --  78.0 79.1 81.1  PLT 278  --  188  --   --   --  234 266 343  < > = values in this interval not displayed. Cardiac Enzymes:  Recent Labs Lab 01/07/16 2055  TROPONINI 0.03   BNP (last 3 results) No results for input(s): PROBNP in the last 8760 hours. CBG:  Recent Labs Lab 01/12/16 1135 01/12/16 1647 01/12/16 2143 01/13/16 0653 01/13/16 1159  GLUCAP 231*  200* 184* 212* 215*    Recent Results (from the past 240 hour(s))  Blood Culture (routine x 2)     Status: None   Collection Time: 01/07/16  2:20 PM  Result Value Ref Range Status   Specimen Description BLOOD RIGHT ANTECUBITAL  Final   Special Requests IN PEDIATRIC BOTTLE 5CC  Final   Culture NO GROWTH 5 DAYS  Final   Report Status 01/12/2016 FINAL  Final  Culture, blood (Routine X 2) w Reflex to ID Panel     Status: None   Collection Time:  01/07/16  2:20 PM  Result Value Ref Range Status   Specimen Description BLOOD LEFT ANTECUBITAL  Final   Special Requests BOTTLES DRAWN AEROBIC AND ANAEROBIC 5CC  Final   Culture NO GROWTH 5 DAYS  Final   Report Status 01/13/2016 FINAL  Final  Blood Culture (routine x 2)     Status: None   Collection Time: 01/07/16  2:25 PM  Result Value Ref Range Status   Specimen Description BLOOD LEFT ANTECUBITAL  Final   Special Requests BOTTLES DRAWN AEROBIC AND ANAEROBIC 5CC  Final   Culture NO GROWTH 5 DAYS  Final   Report Status 01/12/2016 FINAL  Final  Anaerobic culture     Status: None   Collection Time: 01/07/16  5:46 PM  Result Value Ref Range Status   Specimen Description ABSCESS  Final   Special Requests   Final    PERINEAL PATIENT ON FOLLOWING VANC, CLINDAMYCIN, AND MEROPENEM   Gram Stain   Final    ABUNDANT WBC PRESENT,BOTH PMN AND MONONUCLEAR NO SQUAMOUS EPITHELIAL CELLS SEEN ABUNDANT GRAM POSITIVE COCCI IN PAIRS ABUNDANT GRAM NEGATIVE RODS MODERATE GRAM POSITIVE RODS    Culture   Final    MULTIPLE ORGANISMS PRESENT, NONE PREDOMINANT Performed at [facility]    Report Status 01/12/2016 FINAL  Final  Fungus Culture with Smear     Status: None (Preliminary result)   Collection Time: 01/07/16  5:46 PM  Result Value Ref Range Status   Specimen Description ABSCESS  Final   Special Requests   Final    PERINEAL PATIENT ON FOLLOWING VANCM, CLINDAMYCIN, AND MEROPENEM   Fungal Smear   Final    NO YEAST OR FUNGAL ELEMENTS  SEEN Performed at [facility]    Culture   Final    CULTURE IN PROGRESS FOR FOUR WEEKS Performed at [facility]    Report Status PENDING  Incomplete  AFB culture with smear     Status: None (Preliminary result)   Collection Time: 01/07/16  5:46 PM  Result Value Ref Range Status   Specimen Description ABSCESS  Final   Special Requests   Final    PERINEAL PATIENT ON FOLLOWING VANC, CLINDAMYCIN, AND MEROPENEM   Acid Fast Smear   Final    NO ACID FAST BACILLI SEEN Performed at [facility]    Culture   Final    CULTURE WILL BE EXAMINED FOR 6 WEEKS BEFORE ISSUING A FINAL REPORT Performed at [facility]    Report Status PENDING  Incomplete  Culture, routine-abscess     Status: None   Collection Time: 01/07/16  5:46 PM  Result Value Ref Range Status   Specimen Description ABSCESS  Final   Special Requests   Final    PERINEAL PATIENT ON FOLLOWING VANC, CLINDAMYCIN, AND MEROPENEM   Gram Stain   Final    ABUNDANT WBC PRESENT,BOTH PMN AND MONONUCLEAR NO SQUAMOUS EPITHELIAL CELLS SEEN ABUNDANT GRAM POSITIVE COCCI IN PAIRS ABUNDANT GRAM NEGATIVE RODS MODERATE GRAM POSITIVE RODS    Culture   Final    FEW ESCHERICHIA COLI Performed at [facility]    Report Status 01/10/2016 FINAL  Final   Organism ID, Bacteria ESCHERICHIA COLI  Final      Susceptibility   Escherichia coli - MIC*    AMPICILLIN >=32 RESISTANT Resistant     AMPICILLIN/SULBACTAM 4 SENSITIVE Sensitive     CEFEPIME <=1 SENSITIVE Sensitive     CEFTAZIDIME <=1 SENSITIVE Sensitive  CEFTRIAXONE <=1 SENSITIVE Sensitive     CIPROFLOXACIN <=0.25 SENSITIVE Sensitive     GENTAMICIN <=1 SENSITIVE Sensitive     IMIPENEM <=0.25 SENSITIVE Sensitive     PIP/TAZO <=4 SENSITIVE Sensitive     TOBRAMYCIN <=1 SENSITIVE Sensitive     TRIMETH/SULFA Value in next row Resistant      >=320 RESISTANT(NOTE)    * FEW ESCHERICHIA COLI  MRSA PCR Screening     Status: None   Collection Time:  01/07/16  6:36 PM  Result Value Ref Range Status   MRSA by PCR NEGATIVE NEGATIVE Final    Comment:        The GeneXpert MRSA Assay (FDA approved for NASAL specimens only), is one component of a comprehensive MRSA colonization surveillance program. It is not intended to diagnose MRSA infection nor to guide or monitor treatment for MRSA infections.   Urine culture     Status: None   Collection Time: 01/08/16 11:15 AM  Result Value Ref Range Status   Specimen Description URINE, CATHETERIZED  Final   Special Requests NONE  Final   Culture NO GROWTH 1 DAY  Final   Report Status 01/09/2016 FINAL  Final         Studies: No results found.      Scheduled Meds: . heparin  5,000 Units Subcutaneous 3 times per day  . imipenem-cilastatin  500 mg Intravenous Q6H  . insulin aspart  0-15 Units Subcutaneous TID WC  . insulin aspart  0-5 Units Subcutaneous QHS  . insulin glargine  25 Units Subcutaneous Q24H  . sodium bicarbonate  650 mg Oral TID   Continuous Infusions:    Active Problems:   Fournier's gangrene    Time spent: 20 minutes.    Marcellus Scott, MD, FACP, FHM. [facility] Pager [phone]  If 7PM-7AM, please contact night-coverage [email or web address] Password [email or web address] 01/13/2016, 1:29 PM    LOS: 6 days

## 2016-01-13 NOTE — Progress Notes (Signed)
Inpatient Diabetes Program Recommendations  AACE/ADA: New Consensus Statement on Inpatient Glycemic Control (2015)  Target Ranges:  Prepandial:   less than 140 mg/dL      Peak postprandial:   less than 180 mg/dL (1-2 hours)      Critically ill patients:  140 - 180 mg/dL   Results for IWAO, SHAMBLIN (MRN [id number]) as of 01/13/2016 10:01  Ref. Range 01/12/2016 06:22 01/12/2016 11:35 01/12/2016 16:47 01/12/2016 21:43 01/13/2016 06:53  Glucose-Capillary Latest Ref Range: 65-99 mg/dL 409 (H) 811 (H) 914 (H) 184 (H) 212 (H)   DM Coordinator Recommendations:  Fasting glucose is still above 200 this am. May want to increase basal insulin again slightly to Lantus 30 units Q 24hrs.    Consult note:  Spoke with patient about diabetes and home regimen for diabetes control. Patient reports that he was followed by a pediatrician for his DM management but was discharged from the service due to age. He reports going to [facility] with [facility] to be referred from there. Patient was taking Novolog Correction scale and Metformin up unitl 2 days before his admission when his Rx ran out. He had not been able to get a new PCP for refills yet.  Inquired about knowledge about A1C and patient reports that he does know what an A1C is. He mentioned the last A1c he had was taken about 4 years ago when he was diagnosed with DM 2. Patient reports having a sister with DM 1. Discussed A1C results (15.5% on 01/07/16), basic pathophysiology of DM Type 2, basic home care, importance of checking CBGs and maintaining good CBG control to prevent long-term and short-term complications. Patient reports his meter being broken for months.  Discussed impact of nutrition, exercise, stress, sickness, and medications on diabetes control.  Patient states that he does not drink or eat like he is suppose to. Discussed carbohydrates, carbohydrate goals per day and meal, along with portion sizes. Gave patient a Diabetes meal planning  guide. Patient is willing to be placed back on Lantus in addition to his Novolog and Metformin at time of discharge. Patient verbalized understanding of information discussed and he states that he has no further questions at this time related to diabetes.   MD at time of discharge, please order Lantus Flex pen/Novolog Flexpen/Home Metformin dose in addition to insulin pen needles (order # [id number]). Patient declined Outpatient Education at this time.  Thanks, Christena Deem RN, MSN, [facility] Inpatient Diabetes Coordinator Team Pager [phone] (8a-5p)

## 2016-01-14 DIAGNOSIS — Z959 Presence of cardiac and vascular implant and graft, unspecified: Secondary | ICD-10-CM

## 2016-01-14 LAB — BASIC METABOLIC PANEL
Anion gap: 5 (ref 5–15)
CHLORIDE: 103 mmol/L (ref 101–111)
CO2: 29 mmol/L (ref 22–32)
Calcium: 8.3 mg/dL — ABNORMAL LOW (ref 8.9–10.3)
Creatinine, Ser: 0.58 mg/dL — ABNORMAL LOW (ref 0.61–1.24)
Glucose, Bld: 231 mg/dL — ABNORMAL HIGH (ref 65–99)
POTASSIUM: 3.8 mmol/L (ref 3.5–5.1)
SODIUM: 137 mmol/L (ref 135–145)

## 2016-01-14 LAB — GLUCOSE, CAPILLARY
GLUCOSE-CAPILLARY: 194 mg/dL — AB (ref 65–99)
GLUCOSE-CAPILLARY: 198 mg/dL — AB (ref 65–99)
GLUCOSE-CAPILLARY: 213 mg/dL — AB (ref 65–99)
Glucose-Capillary: 125 mg/dL — ABNORMAL HIGH (ref 65–99)

## 2016-01-14 MED ORDER — ZOLPIDEM TARTRATE 5 MG PO TABS
5.0000 mg | ORAL_TABLET | Freq: Once | ORAL | Status: AC
  Administered 2016-01-14: 5 mg via ORAL
  Filled 2016-01-14: qty 1

## 2016-01-14 MED ORDER — INSULIN GLARGINE 100 UNIT/ML ~~LOC~~ SOLN
34.0000 [IU] | SUBCUTANEOUS | Status: DC
  Administered 2016-01-15: 34 [IU] via SUBCUTANEOUS
  Filled 2016-01-14 (×2): qty 0.34

## 2016-01-14 MED ORDER — INSULIN GLARGINE 100 UNIT/ML ~~LOC~~ SOLN
4.0000 [IU] | Freq: Once | SUBCUTANEOUS | Status: AC
  Administered 2016-01-14: 4 [IU] via SUBCUTANEOUS
  Filled 2016-01-14: qty 0.04

## 2016-01-14 NOTE — Progress Notes (Signed)
PROGRESS NOTE    THUNDER BRIDGEWATER ZOX:[id number] DOB: 1996-07-30 DOA: 01/07/2016 PCP: Remus Loffler, PA-C  HPI/Brief narrative 20 year old male with history of DM 2 with noncompliance due to lack of insurance, presented with DKA due to Fournier's gangrene. He was taken to or for emergent debridement on 1/11 & 1/12. He returned to the ICU on a ventilator. DKA resolved. Was under CCM care and transferred to floor and TRH on 01/12/16. General surgery following. ID consulted.   Assessment/Plan:   Sepsis secondary to Fournier's gangrene - Status post OR debridement on 1/11 & 1/12 - Sepsis physiology resolved. - Gen. surgery follow-up appreciated: PT hydrotherapy for fibrinous exudate, don't think additional OR debridement needed & recommend ID consultation for antibiotic recommendations. - Abscess culture 01/10/16: Escherichia coli which is pansensitive except to Bactrim and ampicillin. No AFB seen. Anaerobic culture shows multiple organisms. No yeast or fungal elements. MRSA PCR negative. Blood cultures 2 and urine culture: Negative - Currently on Primaxin. - Wound seems to be improving per CCS note - Infectious disease recommend IV ertapenem for 2 more weeks at discharge. An alternative to ertapenem is ceftriaxone 2 g daily with oral Flagyl 500 MG 3 times a day. PICC line 01/13/2016. - surgery Working to get info to [facility] and set up for Urology reconstruction service  DKA in type II DM, not at goal - History of noncompliance with medications due to lack of insurance. - Mother at bedside states that they will follow-up with PCP who will arrange outpatient endocrinology consultation. - Treated per DKA protocol in ICU. DKA resolved. Off insulin drip. Transitioned to Lantus and SSI. - We will adjust insulin's. Diabetes coordinator consultation appreciated-at time of discharge please order Lantus flex pen/NovoLog flex pen/metformin/insulin pen needles.  - Patient states that he used to be on Lantus  and NovoLog but PCP discontinued Lantus due to improved control and was only on NovoLog PTA. -  A1c: 15.5. - we'll increase Lantus to 34 units daily.  VDRF - Resolved.  Asthma - Stable.  Acute kidney injury - Resolved.  Non-anion gap metabolic acidosis - May be secondary to ongoing mild diarrhea. Initially felt to be secondary to hyperchloremia but that has resolved. Started on oral bicarbonate 650 MG 3 times a day - Bicarbonate has improved from 16 > 24. Follow BMP and may consider reducing bicarbonate dose or discontinuing at discharge depending on progress.  Mild hyperkalemia/hypokalemia - Hypokalemic at 3.1 on 1/17. Replace and follow.  Acute encephalopathy - Resolved  Anemia  - Stable.    DVT prophylaxis: Subcutaneous heparin Code Status: Full Family Communication: None at bedside today. Disposition Plan: DC home when medically stable, and cleared by surgery   Consultants:  General surgery   CCM-signed off  Infectious disease  Procedures:  Debridement of Fournier's gangrene on 1/11 & 1/12   VDRF-resolved.  Antimicrobials:  IV clindamycin 1/11 > 1/14    IV Primaxin 1/11 >  IV meropenem 1 dose  IV vancomycin 1/11 > 1/14  Subjective: No new complaints reported.patient asking  when he can go home..  Objective: Filed Vitals:   01/13/16 0446 01/13/16 1530 01/13/16 2021 01/14/16 0552  BP: 136/88 132/82 125/78 124/74  Pulse: 108 99 91 87  Temp: 98.7 F (37.1 C) 98.1 F (36.7 C) 99.4 F (37.4 C) 99.4 F (37.4 C)  TempSrc: Oral Oral Oral Oral  Resp: Height:      Weight:      SpO2: 96% 98% 97%  94%    Intake/Output Summary (Last 24 hours) at 01/14/16 1317 Last data filed at 01/14/16 0513  Gross per 24 hour  Intake    585 ml  Output      0 ml  Net    585 ml   Filed Weights   01/07/16 1900 01/09/16 0400  Weight: 92.5 kg (203 lb 14.8 oz) 98.6 kg (217 lb 6 oz)    Exam:  General exam: Pleasant young male lying comfortably in  bed . Respiratory system: Clear. No increased work of breathing. Cardiovascular system: S1 & S2 heard, RRR. No JVD, murmurs, gallops, clicks or pedal edema. Gastrointestinal system: Abdomen is nondistended, soft and nontender. Normal bowel sounds heard. Central nervous system: Alert and oriented. No focal neurological deficits. Extremities: Symmetric 5 x 5 power.   Data Reviewed: Basic Metabolic Panel:  Recent Labs Lab 01/08/16 0050  01/09/16 0410  01/10/16 0437 01/11/16 1858 01/12/16 0535 01/13/16 0635 01/14/16 0500  NA 139  < > 138  < > 139 138 135 138 137  K 2.5*  < > 3.5  < > 2.9* 3.9 5.5* 3.1* 3.8  CL 114*  < > 117*  < > 113* 106 108 105 103  CO2 14*  < > 13*  < > 16* 20* 16* 24 29  GLUCOSE 240*  < > 216*  < > 172* 272* 224* 242* 231*  BUN 11  < > 17  < > <5* <5*  CREATININE 0.86  < > 0.80  < > 0.75 0.77 0.71 0.63 0.58*  CALCIUM 7.6*  < > 8.2*  < > 7.8* 8.5* 8.0* 8.4* 8.3*  MG 2.0  --  1.9  --  1.6*  --   --  1.7  --   PHOS <1.0*  --  <1.0*  --  1.9*  --   --  3.5  --   < > = values in this interval not displayed. Liver Function Tests:  Recent Labs Lab 01/13/16 0635  AST 18  ALT 21  ALKPHOS 79  BILITOT 0.6  PROT 5.8*  ALBUMIN 2.2*   No results for input(s): LIPASE, AMYLASE in the last 168 hours. No results for input(s): AMMONIA in the last 168 hours. CBC:  Recent Labs Lab 01/08/16 0050  01/08/16 1335 01/08/16 1345 01/09/16 0410 01/10/16 0437 01/13/16 0635  WBC 13.4*  --   --   --  15.1* 14.8* 10.3  HGB 10.8*  < > 11.6* 10.2* 10.6* 11.0* 11.6*  HCT 31.3*  < > 34.0* 30.0* 30.5* 32.1* 34.7*  MCV 78.3  --   --   --  78.0 79.1 81.1  PLT 188  --   --   --  234 266 343  < > = values in this interval not displayed. Cardiac Enzymes:  Recent Labs Lab 01/07/16 2055  TROPONINI 0.03   BNP (last 3 results) No results for input(s): PROBNP in the last 8760 hours. CBG:  Recent Labs Lab 01/13/16 1159 01/13/16 1718 01/13/16 2130 01/14/16 0642  01/14/16 1157  GLUCAP 215* 229* 198* 194* 198*    Recent Results (from the past 240 hour(s))  Blood Culture (routine x 2)     Status: None   Collection Time: 01/07/16  2:20 PM  Result Value Ref Range Status   Specimen Description BLOOD RIGHT ANTECUBITAL  Final   Special Requests IN PEDIATRIC BOTTLE 5CC  Final   Culture NO GROWTH 5 DAYS  Final   Report Status 01/12/2016  FINAL  Final  Culture, blood (Routine X 2) w Reflex to ID Panel     Status: None   Collection Time: 01/07/16  2:20 PM  Result Value Ref Range Status   Specimen Description BLOOD LEFT ANTECUBITAL  Final   Special Requests BOTTLES DRAWN AEROBIC AND ANAEROBIC 5CC  Final   Culture NO GROWTH 5 DAYS  Final   Report Status 01/13/2016 FINAL  Final  Blood Culture (routine x 2)     Status: None   Collection Time: 01/07/16  2:25 PM  Result Value Ref Range Status   Specimen Description BLOOD LEFT ANTECUBITAL  Final   Special Requests BOTTLES DRAWN AEROBIC AND ANAEROBIC 5CC  Final   Culture NO GROWTH 5 DAYS  Final   Report Status 01/12/2016 FINAL  Final  Anaerobic culture     Status: None   Collection Time: 01/07/16  5:46 PM  Result Value Ref Range Status   Specimen Description ABSCESS  Final   Special Requests   Final    PERINEAL PATIENT ON FOLLOWING VANC, CLINDAMYCIN, AND MEROPENEM   Gram Stain   Final    ABUNDANT WBC PRESENT,BOTH PMN AND MONONUCLEAR NO SQUAMOUS EPITHELIAL CELLS SEEN ABUNDANT GRAM POSITIVE COCCI IN PAIRS ABUNDANT GRAM NEGATIVE RODS MODERATE GRAM POSITIVE RODS    Culture   Final    MULTIPLE ORGANISMS PRESENT, NONE PREDOMINANT Performed at [facility]    Report Status 01/12/2016 FINAL  Final  Fungus Culture with Smear     Status: None (Preliminary result)   Collection Time: 01/07/16  5:46 PM  Result Value Ref Range Status   Specimen Description ABSCESS  Final   Special Requests   Final    PERINEAL PATIENT ON FOLLOWING VANCM, CLINDAMYCIN, AND MEROPENEM   Fungal Smear   Final    NO YEAST  OR FUNGAL ELEMENTS SEEN Performed at [facility]    Culture   Final    CULTURE IN PROGRESS FOR FOUR WEEKS Performed at [facility]    Report Status PENDING  Incomplete  AFB culture with smear     Status: None (Preliminary result)   Collection Time: 01/07/16  5:46 PM  Result Value Ref Range Status   Specimen Description ABSCESS  Final   Special Requests   Final    PERINEAL PATIENT ON FOLLOWING VANC, CLINDAMYCIN, AND MEROPENEM   Acid Fast Smear   Final    NO ACID FAST BACILLI SEEN Performed at [facility]    Culture   Final    CULTURE WILL BE EXAMINED FOR 6 WEEKS BEFORE ISSUING A FINAL REPORT Performed at [facility]    Report Status PENDING  Incomplete  Culture, routine-abscess     Status: None   Collection Time: 01/07/16  5:46 PM  Result Value Ref Range Status   Specimen Description ABSCESS  Final   Special Requests   Final    PERINEAL PATIENT ON FOLLOWING VANC, CLINDAMYCIN, AND MEROPENEM   Gram Stain   Final    ABUNDANT WBC PRESENT,BOTH PMN AND MONONUCLEAR NO SQUAMOUS EPITHELIAL CELLS SEEN ABUNDANT GRAM POSITIVE COCCI IN PAIRS ABUNDANT GRAM NEGATIVE RODS MODERATE GRAM POSITIVE RODS    Culture   Final    FEW ESCHERICHIA COLI Performed at [facility]    Report Status 01/10/2016 FINAL  Final   Organism ID, Bacteria ESCHERICHIA COLI  Final      Susceptibility   Escherichia coli - MIC*    AMPICILLIN >=32 RESISTANT Resistant  AMPICILLIN/SULBACTAM 4 SENSITIVE Sensitive     CEFEPIME <=1 SENSITIVE Sensitive     CEFTAZIDIME <=1 SENSITIVE Sensitive     CEFTRIAXONE <=1 SENSITIVE Sensitive     CIPROFLOXACIN <=0.25 SENSITIVE Sensitive     GENTAMICIN <=1 SENSITIVE Sensitive     IMIPENEM <=0.25 SENSITIVE Sensitive     PIP/TAZO <=4 SENSITIVE Sensitive     TOBRAMYCIN <=1 SENSITIVE Sensitive     TRIMETH/SULFA Value in next row Resistant      >=320 RESISTANT(NOTE)    * FEW ESCHERICHIA COLI  MRSA PCR Screening     Status: None    Collection Time: 01/07/16  6:36 PM  Result Value Ref Range Status   MRSA by PCR NEGATIVE NEGATIVE Final    Comment:        The GeneXpert MRSA Assay (FDA approved for NASAL specimens only), is one component of a comprehensive MRSA colonization surveillance program. It is not intended to diagnose MRSA infection nor to guide or monitor treatment for MRSA infections.   Urine culture     Status: None   Collection Time: 01/08/16 11:15 AM  Result Value Ref Range Status   Specimen Description URINE, CATHETERIZED  Final   Special Requests NONE  Final   Culture NO GROWTH 1 DAY  Final   Report Status 01/09/2016 FINAL  Final         Studies: No results found.      Scheduled Meds: . heparin  5,000 Units Subcutaneous 3 times per day  . imipenem-cilastatin  500 mg Intravenous Q6H  . insulin aspart  0-15 Units Subcutaneous TID WC  . insulin aspart  0-5 Units Subcutaneous QHS  . insulin glargine  30 Units Subcutaneous Q24H  . sodium bicarbonate  650 mg Oral TID   Continuous Infusions:    Active Problems:   Fournier's gangrene    Time spent: 20 minutes.    Huey Bienenstock, MD. [facility] Pager [phone]  If 7PM-7AM, please contact night-coverage [email or web address] Password [email or web address] 01/14/2016, 1:17 PM    LOS: 7 days

## 2016-01-14 NOTE — Progress Notes (Addendum)
[facility]   Reason for visit: Follow up on Fournier's gangrene  Interval History: getting set up for [facility] urology plastics.  On imipenem.  Has picc line.   Physical Exam: Constitutional:  Filed Vitals:   01/13/16 2021 01/14/16 0552  BP: 125/78 124/74  Pulse: 91 87  Temp: 99.4 F (37.4 C) 99.4 F (37.4 C)  Resp: 18 16   patient appears in NAD Respiratory: Normal respiratory effort; CTA B Cardiovascular: RRR  Review of Systems: Constitutional: negative for fevers and chills  Lab Results  Component Value Date   WBC 10.3 01/13/2016   HGB 11.6* 01/13/2016   HCT 34.7* 01/13/2016   MCV 81.1 01/13/2016   PLT 343 01/13/2016    Lab Results  Component Value Date   CREATININE 0.58* 01/14/2016   BUN <5* 01/14/2016   NA 137 01/14/2016   K 3.8 01/14/2016   CL 103 01/14/2016   CO2 29 01/14/2016    Lab Results  Component Value Date   ALT 21 01/13/2016   AST 18 01/13/2016   ALKPHOS 79 01/13/2016     Microbiology: Recent Results (from the past 240 hour(s))  Blood Culture (routine x 2)     Status: None   Collection Time: 01/07/16  2:20 PM  Result Value Ref Range Status   Specimen Description BLOOD RIGHT ANTECUBITAL  Final   Special Requests IN PEDIATRIC BOTTLE 5CC  Final   Culture NO GROWTH 5 DAYS  Final   Report Status 01/12/2016 FINAL  Final  Culture, blood (Routine X 2) w Reflex to ID Panel     Status: None   Collection Time: 01/07/16  2:20 PM  Result Value Ref Range Status   Specimen Description BLOOD LEFT ANTECUBITAL  Final   Special Requests BOTTLES DRAWN AEROBIC AND ANAEROBIC 5CC  Final   Culture NO GROWTH 5 DAYS  Final   Report Status 01/13/2016 FINAL  Final  Blood Culture (routine x 2)     Status: None   Collection Time: 01/07/16  2:25 PM  Result Value Ref Range Status   Specimen Description BLOOD LEFT ANTECUBITAL  Final   Special Requests BOTTLES DRAWN AEROBIC AND ANAEROBIC 5CC  Final   Culture NO GROWTH 5 DAYS  Final   Report Status 01/12/2016 FINAL  Final  Anaerobic culture     Status: None   Collection Time: 01/07/16  5:46 PM  Result Value Ref Range Status   Specimen Description ABSCESS  Final   Special Requests   Final    PERINEAL PATIENT ON FOLLOWING VANC, CLINDAMYCIN, AND MEROPENEM   Gram Stain   Final    ABUNDANT WBC PRESENT,BOTH PMN AND MONONUCLEAR NO SQUAMOUS EPITHELIAL CELLS SEEN ABUNDANT GRAM POSITIVE COCCI IN PAIRS ABUNDANT GRAM NEGATIVE RODS MODERATE GRAM POSITIVE RODS    Culture   Final    MULTIPLE ORGANISMS PRESENT, NONE PREDOMINANT Performed at [facility]    Report Status 01/12/2016 FINAL  Final  Fungus Culture with Smear     Status: None (Preliminary result)   Collection Time: 01/07/16  5:46 PM  Result Value Ref Range Status   Specimen Description ABSCESS  Final   Special Requests   Final    PERINEAL PATIENT ON FOLLOWING VANCM, CLINDAMYCIN, AND MEROPENEM   Fungal Smear   Final    NO YEAST OR FUNGAL ELEMENTS SEEN Performed at [facility]    Culture   Final    CULTURE IN PROGRESS FOR FOUR WEEKS Performed at [facility]    Report Status PENDING  Incomplete  AFB culture with smear     Status: None (Preliminary result)   Collection Time: 01/07/16  5:46 PM  Result Value Ref Range Status   Specimen Description ABSCESS  Final   Special Requests   Final    PERINEAL PATIENT ON FOLLOWING VANC, CLINDAMYCIN, AND MEROPENEM   Acid Fast Smear   Final    NO ACID FAST BACILLI SEEN Performed at [facility]    Culture   Final    CULTURE WILL BE EXAMINED FOR 6 WEEKS BEFORE ISSUING A FINAL REPORT Performed at [facility]    Report Status PENDING  Incomplete  Culture, routine-abscess     Status: None   Collection Time: 01/07/16  5:46 PM  Result Value Ref Range Status   Specimen Description ABSCESS  Final   Special Requests   Final    PERINEAL PATIENT ON FOLLOWING VANC, CLINDAMYCIN, AND MEROPENEM   Gram Stain   Final    ABUNDANT WBC  PRESENT,BOTH PMN AND MONONUCLEAR NO SQUAMOUS EPITHELIAL CELLS SEEN ABUNDANT GRAM POSITIVE COCCI IN PAIRS ABUNDANT GRAM NEGATIVE RODS MODERATE GRAM POSITIVE RODS    Culture   Final    FEW ESCHERICHIA COLI Performed at [facility]    Report Status 01/10/2016 FINAL  Final   Organism ID, Bacteria ESCHERICHIA COLI  Final      Susceptibility   Escherichia coli - MIC*    AMPICILLIN >=32 RESISTANT Resistant     AMPICILLIN/SULBACTAM 4 SENSITIVE Sensitive     CEFEPIME <=1 SENSITIVE Sensitive     CEFTAZIDIME <=1 SENSITIVE Sensitive     CEFTRIAXONE <=1 SENSITIVE Sensitive     CIPROFLOXACIN <=0.25 SENSITIVE Sensitive     GENTAMICIN <=1 SENSITIVE Sensitive     IMIPENEM <=0.25 SENSITIVE Sensitive     PIP/TAZO <=4 SENSITIVE Sensitive     TOBRAMYCIN <=1 SENSITIVE Sensitive     TRIMETH/SULFA Value in next row Resistant      >=320 RESISTANT(NOTE)    * FEW ESCHERICHIA COLI  MRSA PCR Screening     Status: None   Collection Time: 01/07/16  6:36 PM  Result Value Ref Range Status   MRSA by PCR NEGATIVE NEGATIVE Final    Comment:        The GeneXpert MRSA Assay (FDA approved for NASAL specimens only), is one component of a comprehensive MRSA colonization surveillance program. It is not intended to diagnose MRSA infection nor to guide or monitor treatment for MRSA infections.   Urine culture     Status: None   Collection Time: 01/08/16 11:15 AM  Result Value Ref Range Status   Specimen Description URINE, CATHETERIZED  Final   Special Requests NONE  Final   Culture NO GROWTH 1 DAY  Final   Report Status 01/09/2016 FINAL  Final    Impression/Plan:  1. Fournier's - I would continue ertapenem as outpatient 2 weeks.  Can use ceftriaxone 2 grams with 500 mg flagyl as alternative if there are any issues.  Antibiotics per home health guidelines. Through January 30th.   Can pull the picc line at the end of treatment.  Thanks, I will sign off.   We are available for follow up if needed.

## 2016-01-14 NOTE — Progress Notes (Signed)
Physical Therapy Wound Treatment Patient Details  Name: Shawn Harmon MRN: [id number] Date of Birth: 06/01/1996  Today's Date: 01/14/2016 Time: 1000-1051 Time Calculation (min): 51 min  Subjective  Subjective: Pt asking about going home. Patient and Family Stated Goals: Everything to heal and look normal.   Date of Onset: 01/07/16 (Date of first I+D) Prior Treatments: 2 I+Ds  Pain Score: Pain Score: Premedicated. Significant pain over testicles to touch in order to position pt.  Wound Assessment  Wound / Incision (Open or Dehisced) 01/12/16 Incision - Open Perineum Other (Comment) I+D incisions from Bil groin posterior to rectum (Active)  Dressing Type ABD;Gauze (Comment);Mesh briefs;Moist to dry;Impregnated gauze (petrolatum) 01/14/2016  2:11 PM  Dressing Changed Changed 01/14/2016  2:11 PM  Dressing Status Clean;Dry;Intact 01/14/2016  2:11 PM  Dressing Change Frequency Twice a day 01/14/2016  2:11 PM  Site / Wound Assessment Granulation tissue;Painful;Pink;Yellow 01/14/2016  2:11 PM  % Wound base Red or Granulating 70% 01/14/2016  2:11 PM  % Wound base Yellow 30% 01/14/2016  2:11 PM  % Wound base Black 0% 01/14/2016  2:11 PM  % Wound base Other (Comment) 0% 01/14/2016  2:11 PM  Peri-wound Assessment Erythema (blanchable) 01/14/2016  2:11 PM  Wound Length (cm) 20.8 cm 01/12/2016  2:00 PM  Wound Width (cm) 9.1 cm 01/12/2016  2:00 PM  Wound Depth (cm) 5.7 cm 01/12/2016  2:00 PM  Margins Unattached edges (unapproximated) 01/14/2016  2:11 PM  Closure None 01/14/2016  2:11 PM  Drainage Amount Moderate 01/14/2016  2:11 PM  Drainage Description Serosanguineous 01/14/2016  2:11 PM  Non-staged Wound Description Not applicable 05/28/931  3:55 PM  Treatment Debridement (Selective);Hydrotherapy (Pulse lavage);Packing (Saline gauze) 01/14/2016  2:11 PM   Hydrotherapy Pulsed lavage therapy - wound location: Perineum Pulsed Lavage with Suction (psi): 4 psi (4-8) Pulsed Lavage with Suction - Normal Saline  Used: 1000 mL Pulsed Lavage Tip: Tip with splash shield Selective Debridement Selective Debridement - Location: Perineum Selective Debridement - Tools Used: Forceps;Scissors Selective Debridement - Tissue Removed: Yellow slough   Wound Assessment and Plan  Wound Therapy - Assess/Plan/Recommendations Wound Therapy - Clinical Statement: Wound improving with lessening slough. Mother in room but would not look at wound. Expect pt will be changing the dressing himself at home. Expect pt may be able to do this with some initial support from home health. Wound Therapy - Functional Problem List: Poor DM management, opening draining wound, hx medical non-compliance. Factors Delaying/Impairing Wound Healing: Diabetes Mellitus;Infection - systemic/local;Polypharmacy Hydrotherapy Plan: Debridement;Dressing change;Patient/family education;Pulsatile lavage with suction Wound Therapy - Frequency: 6X / week Wound Therapy - Follow Up Recommendations: Home health RN Wound Plan: See Above.  Wound Therapy Goals- Improve the function of patient's integumentary system by progressing the wound(s) through the phases of wound healing (inflammation - proliferation - remodeling) by: Decrease Necrotic Tissue to: 20 Decrease Necrotic Tissue - Progress: Progressing toward goal Increase Granulation Tissue to: 80 Increase Granulation Tissue - Progress: Progressing toward goal Improve Drainage Characteristics: Min Improve Drainage Characteristics - Progress: Progressing toward goal Patient/Family will be able to : Perform own dressing changes.   Patient/Family Instruction Goal - Progress: Progressing toward goal Goals/treatment plan/discharge plan were made with and agreed upon by patient/family: Yes Time For Goal Achievement: 7 days Wound Therapy - Potential for Goals: Good  Goals will be updated until maximal potential achieved or discharge criteria met.  Discharge criteria: when goals achieved, discharge from  hospital, MD decision/surgical intervention, no progress towards goals, refusal/missing three consecutive treatments without  notification or medical reason.  GP     Lanyah Spengler 01/14/2016, 2:16 PM [place]

## 2016-01-14 NOTE — Progress Notes (Signed)
6 Days Post-Op  Subjective: He is doing well, and I will look at wounds shortly when he has the hydro Rx.    Objective: Vital signs in last 24 hours: Temp:  [98.1 F (36.7 C)-99.4 F (37.4 C)] 99.4 F (37.4 C) (01/18 0552) Pulse Rate:  [87-99] 87 (01/18 0552) Resp:  [16-18] 16 (01/18 0552) BP: (124-132)/(74-82) 124/74 mmHg (01/18 0552) SpO2:  [94 %-98 %] 94 % (01/18 0552) Last BM Date: 01/13/16 Afebrile, VSS Labs OK, glucose 190's - 229 yesterday CArb Modified diet   Intake/Output from previous day: 01/17 0701 - 01/18 0700 In: 1075 [P.O.:765; I.V.:10; IV Piggyback:300] Out: -  Intake/Output this shift:    General appearance: alert, cooperative and no distress Skin: see pictures below.  The defect below the testicles measures 12 x 9 x 2 Cm  pictures taken 01/14/16        Lab Results:   Recent Labs  01/13/16 0635  WBC 10.3  HGB 11.6*  HCT 34.7*  PLT 343    BMET  Recent Labs  01/13/16 0635 01/14/16 0500  NA 138 137  K 3.1* 3.8  CL 105 103  CO2 24 29  GLUCOSE 242* 231*  BUN <5* <5*  CREATININE 0.63 0.58*  CALCIUM 8.4* 8.3*   PT/INR No results for input(s): LABPROT, INR in the last 72 hours.   Recent Labs Lab 01/07/16 1227 01/13/16 0635  AST 14* 18  ALT 23 21  ALKPHOS 149* 79  BILITOT 1.8* 0.6  PROT 8.0 5.8*  ALBUMIN 3.2* 2.2*     Lipase  No results found for: LIPASE   Studies/Results: No results found.  Medications: . heparin  5,000 Units Subcutaneous 3 times per day  . imipenem-cilastatin  500 mg Intravenous Q6H  . insulin aspart  0-15 Units Subcutaneous TID WC  . insulin aspart  0-5 Units Subcutaneous QHS  . insulin glargine  30 Units Subcutaneous Q24H  . sodium bicarbonate  650 mg Oral TID    Assessment/Plan Fournier's gangrene S/p irrigation and debridement of the groin 01/07/16 & 01/08/16, Dr. Axel Filler AODM with poor control DVT:  Heparin/SCD Antibiotics  Day 8 antibiotics; completed 4 day of clindamycin and  vancomycin on 01/09/15, day 8 of imipenem-cilastatin today.  Plan:  Working to get info to [facility] and set up for Urology reconstruction service.  Plan currently is to send him on on IV antibiotics and local wound care.  He is getting Hydro Rx, Vaseline gauze to testicles and then wet to dry base wound.    LOS: 7 days    Shawn Harmon 01/14/2016

## 2016-01-15 ENCOUNTER — Encounter (HOSPITAL_COMMUNITY): Payer: Self-pay | Admitting: *Deleted

## 2016-01-15 ENCOUNTER — Emergency Department (HOSPITAL_COMMUNITY)
Admission: EM | Admit: 2016-01-15 | Discharge: 2016-01-15 | Disposition: A | Discharge status: Home / Self Care | Payer: Medicaid Other | Attending: Emergency Medicine | Admitting: Emergency Medicine

## 2016-01-15 DIAGNOSIS — J45909 Unspecified asthma, uncomplicated: Secondary | ICD-10-CM | POA: Insufficient documentation

## 2016-01-15 DIAGNOSIS — Z880 Allergy status to penicillin: Secondary | ICD-10-CM | POA: Insufficient documentation

## 2016-01-15 DIAGNOSIS — E119 Type 2 diabetes mellitus without complications: Secondary | ICD-10-CM | POA: Insufficient documentation

## 2016-01-15 DIAGNOSIS — Z4800 Encounter for change or removal of nonsurgical wound dressing: Secondary | ICD-10-CM | POA: Diagnosis not present

## 2016-01-15 DIAGNOSIS — Z79899 Other long term (current) drug therapy: Secondary | ICD-10-CM | POA: Insufficient documentation

## 2016-01-15 DIAGNOSIS — Z5189 Encounter for other specified aftercare: Secondary | ICD-10-CM

## 2016-01-15 DIAGNOSIS — Z794 Long term (current) use of insulin: Secondary | ICD-10-CM | POA: Insufficient documentation

## 2016-01-15 LAB — GLUCOSE, CAPILLARY
Glucose-Capillary: 149 mg/dL — ABNORMAL HIGH (ref 65–99)
Glucose-Capillary: 181 mg/dL — ABNORMAL HIGH (ref 65–99)

## 2016-01-15 MED ORDER — INSULIN GLARGINE 100 UNIT/ML SOLOSTAR PEN: 30.0000 [IU] | PEN_INJECTOR | Freq: Every day | SUBCUTANEOUS | Status: DC

## 2016-01-15 MED ORDER — OXYCODONE-ACETAMINOPHEN 5-325 MG PO TABS: 1.0000 | ORAL_TABLET | Freq: Once | ORAL | Status: DC

## 2016-01-15 MED ORDER — INSULIN PEN NEEDLE 31G X 5 MM MISC: Status: DC

## 2016-01-15 MED ORDER — SODIUM CHLORIDE 0.9 % IV SOLN: 500.0000 mg | Freq: Four times a day (QID) | INTRAVENOUS | Status: DC

## 2016-01-15 MED ORDER — OXYCODONE HCL 5 MG PO TABS: 5.0000 mg | ORAL_TABLET | Freq: Four times a day (QID) | ORAL | Status: DC | PRN

## 2016-01-15 MED ORDER — SODIUM CHLORIDE 0.9 % IV SOLN: 1.0000 g | INTRAVENOUS | Status: AC

## 2016-01-15 MED ORDER — GLUCOSE BLOOD VI STRP: ORAL_STRIP | Status: DC

## 2016-01-15 MED ORDER — INSULIN GLARGINE 100 UNIT/ML SOLOSTAR PEN: 34.0000 [IU] | PEN_INJECTOR | Freq: Every day | SUBCUTANEOUS | Status: DC

## 2016-01-15 MED ORDER — FREESTYLE SYSTEM KIT: 1.0000 | PACK | Status: DC | PRN

## 2016-01-15 MED ORDER — ACETAMINOPHEN 160 MG/5ML PO SOLN: 650.0000 mg | Freq: Four times a day (QID) | ORAL | Status: DC | PRN

## 2016-01-15 MED ORDER — OXYCODONE-ACETAMINOPHEN 5-325 MG PO TABS
2.0000 | ORAL_TABLET | Freq: Once | ORAL | Status: AC
  Administered 2016-01-15: 2 via ORAL
  Filled 2016-01-15: qty 2

## 2016-01-15 NOTE — Progress Notes (Signed)
Discharge teachings done with pt and mother. Pt and mother verbalize understanding of all teachings. Pt and mother verbalize s/sx of low and high blood sugar and emergency measures to take. Pt and mother state that they, "know how to give insulin via pen, check blood sugars and write them down, and do ketone urine strips." Mother states that she is a Type II diabetic and her daughter and son are both Type I diabetics. Mother states, "so we are familiar with diabetic care". Pt to follow up with PCP within 7 days and already has follow up appt.

## 2016-01-15 NOTE — Discharge Instructions (Signed)
Follow with Primary MD Remus Loffler, PA-C in 7 days   Get CBC, CMP, 2 view Chest X ray checked  by Primary MD next visit.    Activity: As tolerated with Full fall precautions use walker/cane & assistance as needed   Disposition Home    Diet:  Carbohydrate modified , with feeding assistance and aspiration precautions.  For Heart failure patients - Check your Weight same time everyday, if you gain over 2 pounds, or you develop in leg swelling, experience more shortness of breath or chest pain, call your Primary MD immediately. Follow Cardiac Low Salt Diet and 1.5 lit/day fluid restriction.   On your next visit with your primary care physician please Get Medicines reviewed and adjusted.   Please request your Prim.MD to go over all Hospital Tests and Procedure/Radiological results at the follow up, please get all Hospital records sent to your Prim MD by signing hospital release before you go home.   If you experience worsening of your admission symptoms, develop shortness of breath, life threatening emergency, suicidal or homicidal thoughts you must seek medical attention immediately by calling 911 or calling your MD immediately  if symptoms less severe.  You Must read complete instructions/literature along with all the possible adverse reactions/side effects for all the Medicines you take and that have been prescribed to you. Take any new Medicines after you have completely understood and accpet all the possible adverse reactions/side effects.   Do not drive, operating heavy machinery, perform activities at heights, swimming or participation in water activities or provide baby sitting services if your were admitted for syncope or siezures until you have seen by Primary MD or a Neurologist and advised to do so again.  Do not drive when taking Pain medications.    Do not take more than prescribed Pain, Sleep and Anxiety Medications  Special Instructions: If you have smoked or chewed  Tobacco  in the last 2 yrs please stop smoking, stop any regular Alcohol  and or any Recreational drug use.  Wear Seat belts while driving.   Please note  You were cared for by a hospitalist during your hospital stay. If you have any questions about your discharge medications or the care you received while you were in the hospital after you are discharged, you can call the unit and asked to speak with the hospitalist on call if the hospitalist that took care of you is not available. Once you are discharged, your primary care physician will handle any further medical issues. Please note that NO REFILLS for any discharge medications will be authorized once you are discharged, as it is imperative that you return to your primary care physician (or establish a relationship with a primary care physician if you do not have one) for your aftercare needs so that they can reassess your need for medications and monitor your lab values.

## 2016-01-15 NOTE — Progress Notes (Signed)
Pt refuses flu and pneumonia shots.

## 2016-01-15 NOTE — ED Notes (Signed)
Pt states he just left the hospital but his wound needs to be repacked because it is falling out. Home health nurse is supposed to visit tomorrow, family doe snot have any supplies yet.

## 2016-01-15 NOTE — Discharge Instructions (Signed)
Follow up with surgery and primary care as scheduled.  Return to ED as needed.

## 2016-01-15 NOTE — Progress Notes (Signed)
Physical Therapy Wound Treatment Patient Details  Name: Shawn Harmon MRN: [id number] Date of Birth: 1996-03-18  Today's Date: 01/15/2016 Time: 4098-1191 Time Calculation (min): 38 min  Subjective  Subjective: Pt asking about going home. Patient and Family Stated Goals: Everything to heal and look normal.   Date of Onset: 01/07/16 (Date of first I+D) Prior Treatments: 2 I+Ds  Pain Score:  Premedicated with   Wound Assessment  Wound / Incision (Open or Dehisced) 01/12/16 Incision - Open Perineum Other (Comment) I+D incisions from Bil groin posterior to rectum (Active)  Dressing Type ABD;Gauze (Comment);Mesh briefs;Moist to dry;Impregnated gauze (petrolatum) 01/15/2016  9:56 AM  Dressing Changed Changed 01/15/2016  9:56 AM  Dressing Status Clean;Dry;Intact 01/15/2016  9:56 AM  Dressing Change Frequency Twice a day 01/15/2016  9:56 AM  Site / Wound Assessment Granulation tissue;Painful;Pink;Yellow 01/15/2016  9:56 AM  % Wound base Red or Granulating 75% 01/15/2016  9:56 AM  % Wound base Yellow 25% 01/15/2016  9:56 AM  % Wound base Black 0% 01/15/2016  9:56 AM  % Wound base Other (Comment) 0% 01/15/2016  9:56 AM  Peri-wound Assessment Erythema (blanchable) 01/15/2016  9:56 AM  Wound Length (cm) 20.8 cm 01/12/2016  2:00 PM  Wound Width (cm) 9.1 cm 01/12/2016  2:00 PM  Wound Depth (cm) 5.7 cm 01/12/2016  2:00 PM  Margins Unattached edges (unapproximated) 01/15/2016  9:56 AM  Closure None 01/15/2016  9:56 AM  Drainage Amount Moderate 01/15/2016  9:56 AM  Drainage Description Serosanguineous 01/15/2016  9:56 AM  Non-staged Wound Description Not applicable 4/78/2956  2:13 AM  Treatment Debridement (Selective);Hydrotherapy (Pulse lavage);Packing (Saline gauze) 01/15/2016  9:56 AM   Hydrotherapy Pulsed lavage therapy - wound location: Perineum Pulsed Lavage with Suction (psi): 4 psi (4-8) Pulsed Lavage with Suction - Normal Saline Used: 1000 mL Pulsed Lavage Tip: Tip with splash shield Selective  Debridement Selective Debridement - Location: Perineum Selective Debridement - Tools Used: Forceps;Scissors Selective Debridement - Tissue Removed: [place]   Wound Assessment and Plan  Wound Therapy - Assess/Plan/Recommendations Wound Therapy - Clinical Statement: Wound improving. Pt does not want mother to look at or help manage wound. Expect pt will performing dressing changes himself. Wound Therapy - Functional Problem List: Poor DM management, opening draining wound, hx medical non-compliance. Factors Delaying/Impairing Wound Healing: Diabetes Mellitus;Infection - systemic/local;Polypharmacy Hydrotherapy Plan: Debridement;Dressing change;Patient/family education;Pulsatile lavage with suction Wound Therapy - Frequency: 6X / week Wound Therapy - Follow Up Recommendations: Home health RN Wound Plan: See Above.  Wound Therapy Goals- Improve the function of patient's integumentary system by progressing the wound(s) through the phases of wound healing (inflammation - proliferation - remodeling) by: Decrease Necrotic Tissue to: 20 Decrease Necrotic Tissue - Progress: Progressing toward goal Increase Granulation Tissue to: 80 Increase Granulation Tissue - Progress: Progressing toward goal Improve Drainage Characteristics: Min Improve Drainage Characteristics - Progress: Progressing toward goal Patient/Family will be able to : Perform own dressing changes.   Patient/Family Instruction Goal - Progress: Progressing toward goal  Goals will be updated until maximal potential achieved or discharge criteria met.  Discharge criteria: when goals achieved, discharge from hospital, MD decision/surgical intervention, no progress towards goals, refusal/missing three consecutive treatments without notification or medical reason.  GP     Shawn Harmon 01/15/2016, 10:03 AM [place]

## 2016-01-15 NOTE — Discharge Summary (Addendum)
Shawn Harmon, is a 20 y.o. male  DOB 09-Jan-1996  MRN [id number].  Admission date:  01/07/2016  Admitting Physician  Ralene Ok, MD  Discharge Date:  01/15/2016   Primary MD  Shawn Sleeper, PA-C  Recommendations for primary care physician for things to follow:  - Please check CBC, BMP during next visit, patient will need further titration of his diabetes regimen, start on lactulose flexed pain, consider to resume metformin as an outpatient. - Continue with IV antibiotics still 01/26/2016 as per ID recommendation. - Patient to follow with you and see urology reconstructive surgery team, follow-up is being arranged by surgical team, they have faxed his papers already, waiting for response. - Follow up with primary surgical team on a prescheduled appointment 01/22/2016   Admission Diagnosis  abscess Perineal Abscess Fornier's Gangrene   Discharge Diagnosis  abscess Perineal Abscess Fornier's Gangrene    Active Problems:   Fournier's gangrene      Past Medical History  Diagnosis Date  . Asthma   . Allergy     seasonal  . Diabetes mellitus without complication [facility])     Past Surgical History  Procedure Laterality Date  . Incision and drainage perirectal abscess N/A 01/07/2016    Procedure: INCISION AND DRAINAGE OF PERINEUM;  Surgeon: Ralene Ok, MD;  Location: [place];  Service: General;  Laterality: N/A;  . Irrigation and debridement abscess Bilateral 01/08/2016    Procedure: IRRIGATION AND DEBRIDEMENT GROIN;  Surgeon: Ralene Ok, MD;  Location: [place];  Service: General;  Laterality: Bilateral;       History of present illness and  Hospital Course:     Kindly see H&P for history of present illness and admission details, please review complete Labs, Consult reports and Test reports for all details in brief  HPI  from the history and physical done on the day of admission  01/07/2016 Shawn Harmon is a 20 y.o. male with PMH as outlined below including DM to noncompliance of medications due to lack of insurance. He states he has not taken his diabetic medication in over a year because of this. He presented to [facility] ED 01/11 because of progressive enlargement of a rectal/scrotal abscess. He states that abscess began below his rectum about a week ago and has gradually progressed to the point that it is now extended into the scrotum and has developed significant pain causing difficulty to urinate and ambulate. Pain rated 10 out of 10. He has been using antibiotic ointment and has had no relief. On morning of presentation he had a fever of 101F.  In ED he was found to have a very large scrotal abscess extending into the perineum. There was concern for Fournier's gangrene therefore CT of the abdomen and pelvis was obtained. This revealed a large volume of gas in the left scrotum that tracked into the perineum compatible with Fournier's. He was evaluated by general surgery and was taken to the OR for emergent debridement.  In addition, he was found to be  in DKA. Postoperatively he returned to the ICU on the ventilator therefore PCCM was consulted for her call management.   Hospital Course   20 year old male with history of DM 2 with noncompliance due to lack of insurance, presented with DKA due to Fournier's gangrene. He was taken to or for emergent debridement on 1/11 & 1/12. He returned to the ICU on a ventilator. DKA resolved. Was under CCM care and transferred to floor and [place] on 01/12/16. General surgery following. ID consulted.  Sepsis secondary to Fournier's gangrene - Status post OR debridement on 1/11 & 1/12 - Sepsis physiology resolved at time of discharge. - Seen by Gen. Surgery, PT hydrotherapy for fibrinous exudate while inpatient, continue with wound care on discharge . - Abscess culture 01/10/16: Escherichia coli which is pansensitive except to Bactrim and  ampicillin. No AFB seen. Anaerobic culture shows multiple organisms. No yeast or fungal elements. MRSA PCR negative. Blood cultures 2 and urine culture: Negative, infectious disease consult appreciated,they  recommend IV ertapenemtill 01/26/2016. An alternative to ertapenem is ceftriaxone 2 g daily with oral Flagyl 500 MG 3 times a day. PICC line 01/13/2016. - surgery Working to get info to [facility] and set up for Urology reconstruction service, they have already made referral, and faxed papers to [facility],   DKA in type II DM, not at goal - History of noncompliance with medications due to lack of insurance. - Mother at bedside states that they will follow-up with PCP who will arrange outpatient endocrinology consultation. - Treated per DKA protocol in ICU. DKA resolved. Off insulin drip. Transitioned to Lantus and SSI. - We will adjust insulin's. Diabetes coordinator consultation appreciated - Patient states that he used to be on Lantus and NovoLog but PCP discontinued Lantus due to improved control and was only on NovoLog PTA. - A1c: 15.5. - Patient will be discharged on Lantus 30 units subcutaneous at bedtime, given prescription for flex pen, his CBG has been controlled on current regimen   VDRF - Resolved.  Asthma - Stable.  Acute kidney injury - Resolved.  Non-anion gap metabolic acidosis - May be secondary to ongoing mild diarrhea. Initially felt to be secondary to hyperchloremia but that has resolved. Started on oral bicarbonate 650 MG 3 times a day - Bicarbonate has improved from 16 > 24. Follow BMP and may consider reducing bicarbonate dose or discontinuing at discharge depending on progress.  Mild hyperkalemia/hypokalemia - repleted.  Acute encephalopathy - Resolved  Anemia  - Stable.    Discharge Condition:  stable   Follow UP  Follow-up Information    Follow up with [facility].   Why:  They will contact you to schedue nurse visits.    Contact  information:   [address] [place] [address] [phone]       Follow up with Shawn Ivan, MD On 01/22/2016.   Specialty:  General Surgery   Why:  Your appointment is at 11:40 AM, be there 30 minutes early for check in.   Contact information:   [place] [address] [phone]         Discharge Instructions  and  Discharge Medications         Discharge Instructions    Discharge instructions    Complete by:  As directed   Follow with Primary MD Shawn Sleeper, PA-C in 7 days   Get CBC, CMP, checked  by Primary MD next visit.    Activity: As  tolerated with Full fall precautions use walker/cane & assistance as needed   Disposition Home    Diet: Carbohydrate modified, with feeding assistance and aspiration precautions.  For Heart failure patients - Check your Weight same time everyday, if you gain over 2 pounds, or you develop in leg swelling, experience more shortness of breath or chest pain, call your Primary MD immediately. Follow Cardiac Low Salt Diet and 1.5 lit/day fluid restriction.   On your next visit with your primary care physician please Get Medicines reviewed and adjusted.   Please request your Prim.MD to go over all Hospital Tests and Procedure/Radiological results at the follow up, please get all Hospital records sent to your Prim MD by signing hospital release before you go home.   If you experience worsening of your admission symptoms, develop shortness of breath, life threatening emergency, suicidal or homicidal thoughts you must seek medical attention immediately by calling 911 or calling your MD immediately  if symptoms less severe.  You Must read complete instructions/literature along with all the possible adverse reactions/side effects for all the Medicines you take and that have been prescribed to you. Take any new Medicines after you have completely understood and accpet all the possible adverse  reactions/side effects.   Do not drive, operating heavy machinery, perform activities at heights, swimming or participation in water activities or provide baby sitting services if your were admitted for syncope or siezures until you have seen by Primary MD or a Neurologist and advised to do so again.  Do not drive when taking Pain medications.    Do not take more than prescribed Pain, Sleep and Anxiety Medications  Special Instructions: If you have smoked or chewed Tobacco  in the last 2 yrs please stop smoking, stop any regular Alcohol  and or any Recreational drug use.  Wear Seat belts while driving.   Please note  You were cared for by a hospitalist during your hospital stay. If you have any questions about your discharge medications or the care you received while you were in the hospital after you are discharged, you can call the unit and asked to speak with the hospitalist on call if the hospitalist that took care of you is not available. Once you are discharged, your primary care physician will handle any further medical issues. Please note that NO REFILLS for any discharge medications will be authorized once you are discharged, as it is imperative that you return to your primary care physician (or establish a relationship with a primary care physician if you do not have one) for your aftercare needs so that they can reassess your need for medications and monitor your lab values.     Discharge wound care:    Complete by:  As directed   NS WD dressing changes to perineal/scrotal wound BID with Kerlix.  His testicles need to be gently wrapped with Vaseline, non stick gauze, then  in the Kerlix and the rest of the wound packed.  Cover with dry dressing     Increase activity slowly    Complete by:  As directed             Medication List    STOP taking these medications        insulin aspart 100 UNIT/ML injection  Commonly known as:  NOVOLOG FLEXPEN      TAKE these medications          ACCU-CHEK FASTCLIX LANCETS Misc  1 each by Does not apply route as  directed. Check sugar 6 x daily     acetaminophen 160 MG/5ML solution  Commonly known as:  TYLENOL  Take 20.3 mLs (650 mg total) by mouth every 6 (six) hours as needed for mild pain, moderate pain, headache or fever.     acetone (urine) test strip  Check ketones per protocol     ertapenem 1 g in sodium chloride 0.9 % 50 mL  Inject 1 g into the vein daily.     glucagon 1 MG injection  Use for Severe Hypoglycemia . Inject '1mg'$  intramuscularly if unresponsive, unable to swallow, unconscious and/or has seizure     glucose blood test strip  Commonly known as:  ACCU-CHEK SMARTVIEW  Check sugar 6 x daily     glucose blood test strip  Use as instructed     glucose monitoring kit monitoring kit  1 each by Does not apply route as needed for other.     Insulin Glargine 100 UNIT/ML Solostar Pen  Commonly known as:  LANTUS  Inject 34 Units into the skin daily at 10 pm.     Insulin Pen Needle 31G X 5 MM Misc  Daily with lantus     oxyCODONE 5 MG immediate release tablet  Commonly known as:  ROXICODONE  Take 1 tablet (5 mg total) by mouth every 6 (six) hours as needed for severe pain.          Diet and Activity recommendation: See Discharge Instructions above   Consults obtained -   General surgery   CCM-signed off  Infectious disease  Major procedures and Radiology Reports - PLEASE review detailed and final reports for all details, in brief -   IV clindamycin 1/11 > 1/14   IV Primaxin 1/11 >  IV meropenem 1 dose  IV vancomycin 1/11 > 1/14   Ct Abdomen Pelvis W Contrast  01/07/2016  CLINICAL DATA:  Scrotal abscess for a few days. Pain. Initial encounter. EXAM: CT ABDOMEN AND PELVIS WITH CONTRAST TECHNIQUE: Multidetector CT imaging of the abdomen and pelvis was performed using the standard protocol following bolus administration of intravenous contrast. CONTRAST:  100 mL OMNIPAQUE IOHEXOL 300  MG/ML  SOLN COMPARISON:  None. FINDINGS: The lung bases are clear.  No pleural or pericardial effusion. No left kidney is identified. There is compensatory hypertrophy of the right kidney. There is mild fullness of the right renal collecting system. No stone or other obstructing lesion is identified. The spleen, adrenal glands and biliary tree normal. The pancreas is somewhat diminutive but no focal lesion is identified. Gas is seen in the left side of the scrotum tracking posteriorly into the perineum and near the anus. No fluid collection is identified in the left scrotum. There is a fluid collection right scrotum measuring approximately 3.5 cm AP by 2.6 cm transverse by 4.3 cm craniocaudal worrisome for an intrascrotal abscess. Fluid tracks posteriorly toward the anus for a perianal collection measuring 2.6 cm AP x 0.8 cm transverse is identified. No intrapelvic fluid collection is identified. The stomach, small and large bowel and appendix appear normal. No lymphadenopathy is seen. No focal bony abnormality is identified. IMPRESSION: Large volume of gas the left scrotum tracks to the perineum compatible with Fournier's gangrene. Likely intrascrotal abscess in the right scrotum is identified with fluid tracking posteriorly toward the anus for a small perianal fluid collection is seen. Solitary right kidney. Critical Value/emergent results were called by telephone at the time of interpretation on 01/07/2016 at 3:10 pm to Dr. Elnora Morrison ,  who verbally acknowledged these results. Electronically Signed   By: Inge Rise M.D.   On: 01/07/2016 15:10   Dg Chest Port 1 View  01/09/2016  CLINICAL DATA:  Ventilator dependent respiratory failure. EXAM: PORTABLE CHEST 1 VIEW COMPARISON:  01/08/2016 FINDINGS: Support apparatus stable in satisfactorily position. The lungs appear clear.  Cardiac and mediastinal contours normal. No pleural effusion identified. IMPRESSION: 1. The lungs appear clear. Support apparatus  satisfactorily position. Electronically Signed   By: Van Clines M.D.   On: 01/09/2016 07:59   Portable Chest Xray  01/08/2016  CLINICAL DATA:  Ventilator dependent respiratory failure EXAM: PORTABLE CHEST 1 VIEW COMPARISON:  Portable chest x-ray of November 06, 2016. FINDINGS: The lungs are well-expanded and clear. The heart and pulmonary vascularity are normal. There is no pleural effusion or pneumothorax. The endotracheal tube tip projects approximately 5.8 cm above the carina. The esophagogastric tube has its proximal port above the level of the GE junction. The right internal jugular venous catheter tip projects over the midportion of the SVC. IMPRESSION: 1. There is no active cardiopulmonary disease. 2. The esophagogastric tube needs to be advanced approximately 10 cm to assure that the proximal port remains below the GE junction. 3. The endotracheal tube and right internal jugular venous catheter are in reasonable position. Electronically Signed   By: David  Martinique M.D.   On: 01/08/2016 07:27   Dg Chest Port 1 View  01/07/2016  CLINICAL DATA:  Acute respiratory failure. On ventilator. Controlled insulin dependent diabetes. Obesity. EXAM: PORTABLE CHEST 1 VIEW COMPARISON:  None. FINDINGS: Endotracheal tube is seen in place with tip approximately 4 cm above the carina. Right jugular central venous catheter is seen with tip in the distal SVC. Orogastric tube tip is seen in the proximal stomach. Patient is rotated to the right. There is no evidence of pulmonary consolidation or edema. No evidence pleural effusion. Heart size is within normal limits. IMPRESSION: Support lines and tubes in appropriate position. No active lung disease. Electronically Signed   By: Earle Gell M.D.   On: 01/07/2016 20:03    Micro Results     Recent Results (from the past 240 hour(s))  Blood Culture (routine x 2)     Status: None   Collection Time: 01/07/16  2:20 PM  Result Value Ref Range Status   Specimen  Description BLOOD RIGHT ANTECUBITAL  Final   Special Requests IN PEDIATRIC BOTTLE 5CC  Final   Culture NO GROWTH 5 DAYS  Final   Report Status 01/12/2016 FINAL  Final  Culture, blood (Routine X 2) w Reflex to ID Panel     Status: None   Collection Time: 01/07/16  2:20 PM  Result Value Ref Range Status   Specimen Description BLOOD LEFT ANTECUBITAL  Final   Special Requests BOTTLES DRAWN AEROBIC AND ANAEROBIC 5CC  Final   Culture NO GROWTH 5 DAYS  Final   Report Status 01/13/2016 FINAL  Final  Blood Culture (routine x 2)     Status: None   Collection Time: 01/07/16  2:25 PM  Result Value Ref Range Status   Specimen Description BLOOD LEFT ANTECUBITAL  Final   Special Requests BOTTLES DRAWN AEROBIC AND ANAEROBIC 5CC  Final   Culture NO GROWTH 5 DAYS  Final   Report Status 01/12/2016 FINAL  Final  Anaerobic culture     Status: None   Collection Time: 01/07/16  5:46 PM  Result Value Ref Range Status   Specimen Description ABSCESS  Final  Special Requests   Final    PERINEAL PATIENT ON FOLLOWING VANC, CLINDAMYCIN, AND MEROPENEM   Gram Stain   Final    ABUNDANT WBC PRESENT,BOTH PMN AND MONONUCLEAR NO SQUAMOUS EPITHELIAL CELLS SEEN ABUNDANT GRAM POSITIVE COCCI IN PAIRS ABUNDANT GRAM NEGATIVE RODS MODERATE GRAM POSITIVE RODS    Culture   Final    MULTIPLE ORGANISMS PRESENT, NONE PREDOMINANT Performed at [facility]    Report Status 01/12/2016 FINAL  Final  Fungus Culture with Smear     Status: None (Preliminary result)   Collection Time: 01/07/16  5:46 PM  Result Value Ref Range Status   Specimen Description ABSCESS  Final   Special Requests   Final    PERINEAL PATIENT ON FOLLOWING VANCM, CLINDAMYCIN, AND MEROPENEM   Fungal Smear   Final    NO YEAST OR FUNGAL ELEMENTS SEEN Performed at [facility]    Culture   Final    CULTURE IN PROGRESS FOR FOUR WEEKS Performed at [facility]    Report Status PENDING  Incomplete  AFB culture with smear      Status: None (Preliminary result)   Collection Time: 01/07/16  5:46 PM  Result Value Ref Range Status   Specimen Description ABSCESS  Final   Special Requests   Final    PERINEAL PATIENT ON FOLLOWING VANC, CLINDAMYCIN, AND MEROPENEM   Acid Fast Smear   Final    NO ACID FAST BACILLI SEEN Performed at [facility]    Culture   Final    CULTURE WILL BE EXAMINED FOR 6 WEEKS BEFORE ISSUING A FINAL REPORT Performed at [facility]    Report Status PENDING  Incomplete  Culture, routine-abscess     Status: None   Collection Time: 01/07/16  5:46 PM  Result Value Ref Range Status   Specimen Description ABSCESS  Final   Special Requests   Final    PERINEAL PATIENT ON FOLLOWING VANC, CLINDAMYCIN, AND MEROPENEM   Gram Stain   Final    ABUNDANT WBC PRESENT,BOTH PMN AND MONONUCLEAR NO SQUAMOUS EPITHELIAL CELLS SEEN ABUNDANT GRAM POSITIVE COCCI IN PAIRS ABUNDANT GRAM NEGATIVE RODS MODERATE GRAM POSITIVE RODS    Culture   Final    FEW ESCHERICHIA COLI Performed at [facility]    Report Status 01/10/2016 FINAL  Final   Organism ID, Bacteria ESCHERICHIA COLI  Final      Susceptibility   Escherichia coli - MIC*    AMPICILLIN >=32 RESISTANT Resistant     AMPICILLIN/SULBACTAM 4 SENSITIVE Sensitive     CEFEPIME <=1 SENSITIVE Sensitive     CEFTAZIDIME <=1 SENSITIVE Sensitive     CEFTRIAXONE <=1 SENSITIVE Sensitive     CIPROFLOXACIN <=0.25 SENSITIVE Sensitive     GENTAMICIN <=1 SENSITIVE Sensitive     IMIPENEM <=0.25 SENSITIVE Sensitive     PIP/TAZO <=4 SENSITIVE Sensitive     TOBRAMYCIN <=1 SENSITIVE Sensitive     TRIMETH/SULFA Value in next row Resistant      >=320 RESISTANT(NOTE)    * FEW ESCHERICHIA COLI  MRSA PCR Screening     Status: None   Collection Time: 01/07/16  6:36 PM  Result Value Ref Range Status   MRSA by PCR NEGATIVE NEGATIVE Final    Comment:        The GeneXpert MRSA Assay (FDA approved for NASAL specimens only), is one component of  a comprehensive MRSA colonization surveillance program. It is not intended to diagnose MRSA infection nor to guide  or monitor treatment for MRSA infections.   Urine culture     Status: None   Collection Time: 01/08/16 11:15 AM  Result Value Ref Range Status   Specimen Description URINE, CATHETERIZED  Final   Special Requests NONE  Final   Culture NO GROWTH 1 DAY  Final   Report Status 01/09/2016 FINAL  Final       Today   Subjective:   Zaidyn Claire today has no headache,no chest abdominal pain,no new weakness tingling or numbness, feels much better wants to go home today.   Objective:   Blood pressure 123/78, pulse 85, temperature 98.4 F (36.9 C), temperature source Oral, resp. rate 20, height 6' (1.829 m), weight 98.6 kg (217 lb 6 oz), SpO2 98 %.   Intake/Output Summary (Last 24 hours) at 01/15/16 1614 Last data filed at 01/15/16 1500  Gross per 24 hour  Intake    860 ml  Output      0 ml  Net    860 ml    Exam General exam: Pleasant young male lying comfortably in bed . Respiratory system: Clear. No increased work of breathing. Cardiovascular system: S1 & S2 heard, RRR. No JVD, murmurs, gallops, clicks or pedal edema. Gastrointestinal system: Abdomen is nondistended, soft and nontender. Normal bowel sounds heard. Central nervous system: Alert and oriented. No focal neurological deficits. Extremities: Symmetric 5 x 5 power.  Data Review   CBC w Diff:  Lab Results  Component Value Date   WBC 10.3 01/13/2016   HGB 11.6* 01/13/2016   HCT 34.7* 01/13/2016   PLT 343 01/13/2016   LYMPHOPCT 31 07/26/2013   MONOPCT 7 07/26/2013   EOSPCT 0 07/26/2013   BASOPCT 0 07/26/2013    CMP:  Lab Results  Component Value Date   NA 137 01/14/2016   K 3.8 01/14/2016   CL 103 01/14/2016   CO2 29 01/14/2016   BUN <5* 01/14/2016   CREATININE 0.58* 01/14/2016   PROT 5.8* 01/13/2016   ALBUMIN 2.2* 01/13/2016   BILITOT 0.6 01/13/2016   ALKPHOS 79 01/13/2016   AST 18  01/13/2016   ALT 21 01/13/2016  .   Total Time in preparing paper work, data evaluation and todays exam - 35 minutes  Lavon Bothwell M.D on 01/15/2016 at [facility]   Office  [phone]

## 2016-01-15 NOTE — Progress Notes (Signed)
7 Days Post-Op  Subjective: Info faxed to [facility], [phone] yesterday.   Sites look good, I have recommended he use hand held shower to do irrigations at home.   Objective: Vital signs in last 24 hours: Temp:  [98.6 F (37 C)-98.9 F (37.2 C)] 98.6 F (37 C) (01/18 2023) Pulse Rate:  [87-92] 87 (01/18 2023) Resp:  [18] 18 (01/18 2023) BP: (118-130)/(76-80) 130/80 mmHg (01/18 2023) SpO2:  [95 %-98 %] 95 % (01/18 2023) Last BM Date: 01/13/16 540 PO recorded Voided x 5 Afebrile, VSS No labs  Intake/Output from previous day: 01/18 0701 - 01/19 0700 In: 940 [P.O.:540; IV Piggyback:400] Out: -  Intake/Output this shift:    General appearance: alert, cooperative and no distress Skin: No significant change from yesteday, it looks a little better each day  Lab Results:   Recent Labs  01/13/16 0635  WBC 10.3  HGB 11.6*  HCT 34.7*  PLT 343    BMET  Recent Labs  01/13/16 0635 01/14/16 0500  NA 138 137  K 3.1* 3.8  CL 105 103  CO2 24 29  GLUCOSE 242* 231*  BUN <5* <5*  CREATININE 0.63 0.58*  CALCIUM 8.4* 8.3*   PT/INR No results for input(s): LABPROT, INR in the last 72 hours.   Recent Labs Lab 01/13/16 0635  AST 18  ALT 21  ALKPHOS 79  BILITOT 0.6  PROT 5.8*  ALBUMIN 2.2*     Lipase  No results found for: LIPASE   Studies/Results: No results found.  Medications: . heparin  5,000 Units Subcutaneous 3 times per day  . imipenem-cilastatin  500 mg Intravenous Q6H  . insulin aspart  0-15 Units Subcutaneous TID WC  . insulin aspart  0-5 Units Subcutaneous QHS  . insulin glargine  34 Units Subcutaneous Q24H  . sodium bicarbonate  650 mg Oral TID    Assessment/Plan Fournier's gangrene S/p irrigation and debridement of the groin 01/07/16 & 01/08/16, Dr. Axel Filler AODM with poor control DVT: Heparin/SCD Antibiotics Day 8 antibiotics; completed 4 day of clindamycin and vancomycin on 01/09/15, day 8 of imipenem-cilastatin  today. (antibiotics thru 01/26/16 per ID)   Plan:  From our standpoint he can go.  I will put info in for him to see Dr. Derrell Lolling in a week.  I hope we hear from [facility], I will send info to our office so we can help with follow up if he does not get a call this week.      LOS: 8 days    Alicianna Litchford 01/15/2016

## 2016-01-15 NOTE — ED Provider Notes (Signed)
CSN: [id number]   Arrival date & time 01/15/16 2054  History  By signing my name below, I, [name] attest that this documentation has been prepared under the direction and in the presence of [employer] PA-C Electronically Signed: [name], ED Scribe. 01/15/2016. 11:56 PM. Chief Complaint  Patient presents with  . Wound Check    HPI The history is provided by the patient. No language interpreter was used.   Shawn Harmon is a 20 y.o. male with PMHx of DM who presents to the Emergency Department for a wound check. Pt was discharged from the hospital earlier today after being treated for a perineal abscess and fournier's gangrene.  After leaving the hospital his wound packing came undone and he is here to have the wound re-packed and dressed. He is scheduled to be seen by a home health nurse tomorrow but was referred to the ED tonight as the needed supplies are not readily available.   Past Medical History  Diagnosis Date  . Asthma   . Allergy     seasonal  . Diabetes mellitus without complication [facility])     Past Surgical History  Procedure Laterality Date  . Incision and drainage perirectal abscess N/A 01/07/2016    Procedure: INCISION AND DRAINAGE OF PERINEUM;  Surgeon: Ralene Ok, MD;  Location: [place];  Service: General;  Laterality: N/A;  . Irrigation and debridement abscess Bilateral 01/08/2016    Procedure: IRRIGATION AND DEBRIDEMENT GROIN;  Surgeon: Ralene Ok, MD;  Location: [place];  Service: General;  Laterality: Bilateral;    Family History  Problem Relation Age of Onset  . Diabetes Mother   . Hypertension Mother   . Heart disease Mother   . Diabetes Father   . Arthritis Father   . Asthma Father   . Hypertension Father   . Heart disease Father   . Diabetes Sister     Social History  Substance Use Topics  . Smoking status: Never Smoker   . Smokeless tobacco: Never Used  . Alcohol Use: No     Review of Systems  Constitutional: Negative for  fever.  Respiratory: Negative for shortness of breath.   Cardiovascular: Negative for chest pain.  Skin: Positive for wound (scrotum perineum).   Home Medications   Prior to Admission medications   Medication Sig Start Date End Date Taking? Authorizing Provider  ACCU-CHEK FASTCLIX LANCETS MISC 1 each by Does not apply route as directed. Check sugar 6 x daily 01/10/13   [name], MD  acetaminophen (TYLENOL) 160 MG/5ML solution Take 20.3 mLs (650 mg total) by mouth every 6 (six) hours as needed for mild pain, moderate pain, headache or fever. 01/15/16   [name], MD  acetone, urine, test strip Check ketones per protocol 01/10/13   [name], MD  ertapenem 1 g in sodium chloride 0.9 % 50 mL Inject 1 g into the vein daily. 01/15/16 01/26/16  [name], MD  glucagon 1 MG injection Use for Severe Hypoglycemia . Inject '1mg'$  intramuscularly if unresponsive, unable to swallow, unconscious and/or has seizure 01/10/13   [name], MD  glucose blood (ACCU-CHEK SMARTVIEW) test strip Check sugar 6 x daily 01/10/13   [name], MD  glucose blood test strip Use as instructed 01/15/16   [name], MD  glucose monitoring kit (FREESTYLE) monitoring kit 1 each by Does not apply route as needed for other. 01/15/16   [name], MD  Insulin Glargine (LANTUS)  100 UNIT/ML Solostar Pen Inject 34 Units into the skin daily at 10 pm. 01/15/16   [name], MD  Insulin Pen Needle 31G X 5 MM MISC Daily with lantus 01/15/16   [name], MD  oxyCODONE (ROXICODONE) 5 MG immediate release tablet Take 1 tablet (5 mg total) by mouth every 6 (six) hours as needed for severe pain. 01/15/16   [name], MD    Allergies  Amoxicillin  Triage Vitals: BP 107/70 mmHg  Pulse 99  Temp(Src) 98.3 F (36.8 C) (Oral)  Resp 20  SpO2 99%  Physical Exam  Constitutional: He is oriented to person, place, and time. He appears well-developed and  well-nourished. No distress.  HENT:  Head: Normocephalic and atraumatic.  Eyes: Conjunctivae and EOM are normal.  Neck: Neck supple. No tracheal deviation present.  Cardiovascular: Normal rate, regular rhythm and normal heart sounds.   Pulmonary/Chest: Effort normal. No respiratory distress.  Abdominal: Soft. He exhibits no distension. There is no tenderness.  Musculoskeletal: Normal range of motion.  Neurological: He is alert and oriented to person, place, and time.  Skin: Skin is warm and dry.  Perineum with surgical packing half in place.   Psychiatric: He has a normal mood and affect. His behavior is normal.  Nursing note and vitals reviewed.   ED Course  Procedures  DIAGNOSTIC STUDIES: Oxygen Saturation is 99% on RA,  normal by my interpretation.    COORDINATION OF CARE: 10:06 PM Discussed treatment plan which includes percocet and wound care with pt at bedside and pt agreed to the plan.  Labs Review- Labs Reviewed - No data to display  Imaging Review No results found.  MDM   Final diagnoses:  Wound check, abscess   Shawn Harmon presents for 15 cm surgical wound to be re-packed. Given Percocet for pain relief, then packed with 1 inch iodoform packing. Packing that was placed today and that was still packed was kept it place. Patient tolerated well. Wound dressed. Has home health nurse coming to home tomorrow afternoon for wound check and dressing change as needed. Patient to be discharged home in good and stable condition.   I personally performed the services described in this documentation, which was scribed in my presence. The recorded information has been reviewed and is accurate.     [facility] Janie Capp, PA-C 44/61/90 [id number]  Delora Fuel, MD 41/14/64 [id number]

## 2016-01-20 ENCOUNTER — Telehealth: Payer: Self-pay

## 2016-01-20 NOTE — Telephone Encounter (Addendum)
[facility] is calling to say they have not been able to locate the patient.  There was an order for PICC dressing change and labs.  They have been to his home twice and there was no answer.  Patient does not have a visit scheduled with [facility].   Laurell Josephs, RN

## 2016-01-21 NOTE — Telephone Encounter (Signed)
He should have the picc pulled on the 30th after finishing.  Does not necessarily need follow up.  Not sure where he is though.

## 2016-01-29 NOTE — Telephone Encounter (Signed)
Received call from Myrtie Neither, [facility] at [facility].  Patient reached out to [facility] today for wound and PICC supplies.  RN relayed Dr. Ephriam Knuckles verbal order to pull PICC. Andree Coss, RN

## 2016-02-06 LAB — FUNGUS CULTURE W SMEAR: Fungal Smear: NONE SEEN

## 2016-02-19 LAB — AFB CULTURE WITH SMEAR (NOT AT ARMC): Acid Fast Smear: NONE SEEN

## 2016-10-13 ENCOUNTER — Other Ambulatory Visit: Payer: Self-pay

## 2016-10-17 ENCOUNTER — Ambulatory Visit (HOSPITAL_COMMUNITY)
Admission: EM | Admit: 2016-10-17 | Discharge: 2016-10-17 | Disposition: A | Discharge status: Home / Self Care | Payer: Medicaid Other | Attending: Family Medicine | Admitting: Family Medicine

## 2016-10-17 ENCOUNTER — Encounter (HOSPITAL_COMMUNITY): Payer: Self-pay | Admitting: *Deleted

## 2016-10-17 DIAGNOSIS — K0889 Other specified disorders of teeth and supporting structures: Secondary | ICD-10-CM

## 2016-10-17 DIAGNOSIS — K047 Periapical abscess without sinus: Secondary | ICD-10-CM | POA: Diagnosis not present

## 2016-10-17 DIAGNOSIS — K029 Dental caries, unspecified: Secondary | ICD-10-CM

## 2016-10-17 MED ORDER — CLINDAMYCIN HCL 300 MG PO CAPS: 300.0000 mg | ORAL_CAPSULE | Freq: Three times a day (TID) | ORAL | 0 refills | Status: DC

## 2016-10-17 NOTE — ED Provider Notes (Signed)
[facility]    CSN: [id number] Arrival date & time: 10/17/16  1615     History   Chief Complaint Chief Complaint  Patient presents with  . Dental Pain    HPI Shawn Harmon is a 19 y.o. male.   The history is provided by the patient.  Dental Pain  Location:  Upper Upper teeth location:  10/LU lateral incisor Quality:  Constant Severity:  Moderate Onset quality:  Gradual Duration:  1 week Progression:  Waxing and waning Chronicity:  Chronic Context: abscess, crown fracture, dental caries, dental fracture, enamel fracture and poor dentition   Relieved by:  None tried Worsened by:  Nothing Ineffective treatments:  None tried Associated symptoms: gum swelling   Risk factors: lack of dental care     Past Medical History:  Diagnosis Date  . Allergy    seasonal  . Asthma   . Diabetes mellitus without complication (HCC)     Patient Active Problem List   Diagnosis Date Noted  . Fournier's gangrene 01/08/2016  . Type 2 diabetes mellitus (HCC) 01/26/2013  . 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 Goiter 01/26/2013  . Hypertension 01/26/2013  . Acanthosis nigricans, acquired 01/26/2013  . Diabetes mellitus, insulin dependent (IDDM), uncontrolled (HCC) 01/09/2013  . Hyperglycemia 01/09/2013  . Obesity 01/09/2013  . Polyuria 01/09/2013  . Polydipsia 01/09/2013    Past Surgical History:  Procedure Laterality Date  . INCISION AND DRAINAGE PERIRECTAL ABSCESS N/A 01/07/2016   Procedure: INCISION AND DRAINAGE OF PERINEUM;  Surgeon: Armando Ramirez, MD;  [facility];  Service: General;  Laterality: N/A;  . IRRIGATION AND DEBRIDEMENT ABSCESS Bilateral 01/08/2016   Procedure: IRRIGATION AND DEBRIDEMENT GROIN;  Surgeon: Armando Ramirez, MD;  [facility];  Service: General;  Laterality: Bilateral;       Home Medications    Prior to Admission medications   Medication Sig Start Date End Date Taking? Authorizing Provider  ACCU-CHEK FASTCLIX  LANCETS MISC 1 each by Does not apply route as directed. Check sugar 6 x daily 01/10/13   Eric G Sonnenberg, MD  acetaminophen (TYLENOL) 160 MG/5ML solution Take 20.3 mLs (650 mg total) by mouth every 6 (six) hours as needed for mild pain, moderate pain, headache or fever. 01/15/16   Dawood S Elgergawy, MD  acetone, urine, test strip Check ketones per protocol 01/10/13   Eric G Sonnenberg, MD  glucagon 1 MG injection Use for Severe Hypoglycemia . Inject 1mg intramuscularly if unresponsive, unable to swallow, unconscious and/or has seizure 01/10/13   Eric G Sonnenberg, MD  glucose blood (ACCU-CHEK SMARTVIEW) test strip Check sugar 6 x daily 01/10/13   Eric G Sonnenberg, MD  glucose blood test strip Use as instructed 01/15/16   Dawood S Elgergawy, MD  glucose monitoring kit (FREESTYLE) monitoring kit 1 each by Does not apply route as needed for other. 01/15/16   Dawood S Elgergawy, MD  Insulin Glargine (LANTUS) 100 UNIT/ML Solostar Pen Inject 34 Units into the skin daily at 10 pm. 01/15/16   Dawood S Elgergawy, MD  Insulin Pen Needle 31G X 5 MM MISC Daily with lantus 01/15/16   Dawood S Elgergawy, MD  oxyCODONE (ROXICODONE) 5 MG immediate release tablet Take 1 tablet (5 mg total) by mouth every 6 (six) hours as needed for severe pain. 01/15/16   Dawood S Elgergawy, MD    Family History Family History  Problem Relation Age of Onset  . Diabetes Mother   . Hypertension Mother   . Heart   disease Mother   . Diabetes Father   . Arthritis Father   . Asthma Father   . Hypertension Father   . Heart disease Father   . Diabetes Sister     Social History Social History  Substance Use Topics  . Smoking status: Never Smoker  . Smokeless tobacco: Never Used  . Alcohol use No     Allergies   Amoxicillin   Review of Systems Review of Systems  Constitutional: Negative.   HENT: Positive for dental problem.   All other systems reviewed and are negative.    Physical Exam Triage Vital Signs ED Triage  Vitals  Enc Vitals Group     BP 10/17/16 1742 124/76     Pulse Rate 10/17/16 1742 120     Resp --      Temp 10/17/16 1742 98.6 F (37 C)     Temp Source 10/17/16 1742 Oral     SpO2 10/17/16 1742 100 %     Weight --      Height --      Head Circumference --      Peak Flow --      Pain Score 10/17/16 1744 10     Pain Loc --      Pain Edu? --      Excl. in GC? --    No data found.   Updated Vital Signs BP 124/76 (BP Location: Right Arm)   Pulse 120   Temp 98.6 F (37 C) (Oral)   SpO2 100%   Visual Acuity Right Eye Distance:   Left Eye Distance:   Bilateral Distance:    Right Eye Near:   Left Eye Near:    Bilateral Near:     Physical Exam  Constitutional: He appears well-developed. He appears distressed.  HENT:  Mouth/Throat:    Eyes: Pupils are equal, round, and reactive to light.  Neck: Normal range of motion. Neck supple.  Lymphadenopathy:    He has cervical adenopathy.  Skin: Skin is warm and dry.  Nursing note and vitals reviewed.    UC Treatments / Results  Labs (all labs ordered are listed, but only abnormal results are displayed) Labs Reviewed - No data to display  EKG  EKG Interpretation None       Radiology No results found.  Procedures Procedures (including critical care time)  Medications Ordered in UC Medications - No data to display   Initial Impression / Assessment and Plan / UC Course  I have reviewed the triage vital signs and the nursing notes.  Pertinent labs & imaging results that were available during my care of the patient were reviewed by me and considered in my medical decision making (see chart for details).  Clinical Course      Final Clinical Impressions(s) / UC Diagnoses   Final diagnoses:  None    New Prescriptions New Prescriptions   No medications on file     James D Kindl, MD 10/17/16 1815  

## 2016-10-17 NOTE — Discharge Instructions (Signed)
Try [facility]. Take all of antibiotic.

## 2016-10-17 NOTE — ED Triage Notes (Signed)
Pt  Has  Dental  Caries   And  Pain in  l  Upper  Gumline     Pt is  Diabetic   Has  Not  Seen a  Dentist    Yet

## 2016-10-23 ENCOUNTER — Emergency Department (HOSPITAL_COMMUNITY)
Admission: EM | Admit: 2016-10-23 | Discharge: 2016-10-23 | Disposition: A | Discharge status: Home / Self Care | Payer: Medicaid Other | Attending: Emergency Medicine | Admitting: Emergency Medicine

## 2016-10-23 ENCOUNTER — Encounter (HOSPITAL_COMMUNITY): Payer: Self-pay | Admitting: Emergency Medicine

## 2016-10-23 DIAGNOSIS — Z794 Long term (current) use of insulin: Secondary | ICD-10-CM | POA: Insufficient documentation

## 2016-10-23 DIAGNOSIS — E119 Type 2 diabetes mellitus without complications: Secondary | ICD-10-CM | POA: Diagnosis not present

## 2016-10-23 DIAGNOSIS — K029 Dental caries, unspecified: Secondary | ICD-10-CM

## 2016-10-23 DIAGNOSIS — I10 Essential (primary) hypertension: Secondary | ICD-10-CM | POA: Insufficient documentation

## 2016-10-23 DIAGNOSIS — J45909 Unspecified asthma, uncomplicated: Secondary | ICD-10-CM | POA: Diagnosis not present

## 2016-10-23 DIAGNOSIS — K0889 Other specified disorders of teeth and supporting structures: Secondary | ICD-10-CM | POA: Diagnosis present

## 2016-10-23 MED ORDER — CLINDAMYCIN HCL 300 MG PO CAPS: 300.0000 mg | ORAL_CAPSULE | Freq: Three times a day (TID) | ORAL | 0 refills | Status: DC

## 2016-10-23 NOTE — Discharge Instructions (Signed)
You'll need to follow up with a dentist as soon as possible for reevaluation. Use dental resource guide. Encourage oral hygiene, including fluoride rinsing, flossing and brushing your teeth. Return to the ED if you experience facial swelling, fevers, difficulty breathing or swallowing. Take antibiotic as prescribed.

## 2016-10-23 NOTE — ED Triage Notes (Signed)
Pt. Stated, I have 2 teeth that are bad. I went to UC last Sunday and they gave me an antibiotic and Im almost out.

## 2016-10-23 NOTE — ED Notes (Signed)
Pt states he has been unable to find a dentist that takes Medicare. States was [facility] to [facility] but has been unable to get there. States has been taking Ibuprofen 800mg  and Tramadol but continues to have pain.

## 2016-10-23 NOTE — ED Provider Notes (Signed)
[name] Provider Note   By signing my name below, I, Shawn Harmon, attest that this documentation has been prepared under the direction and in the presence of [employer], PA-C. Electronically Signed: Bea Harmon, ED Scribe. 10/23/16. 10:43 AM.    History   Chief Complaint Chief Complaint  Patient presents with  . Dental Problem    The history is provided by the patient. No language interpreter was used.    HPI Comments:  Shawn Harmon is a 20 y.o. male with PMHx of DM who presents to the Emergency Department complaining of upper left and lower right dental pain that began about a week ago. Pt was seen at [facility] six days ago and was prescribed Clindamycin that he reports taking as directed. He reports right sided facial swelling and states his teeth are not hurting at the moment. He has not taken anything for pain. Eating increases his pain. He denies alleviating factors. He denies fever, chills, nausea, vomiting, difficulty swallowing, trismus or difficulty breathing.   Past Medical History:  Diagnosis Date  . Allergy    seasonal  . Asthma   . Diabetes mellitus without complication [name])     Patient Active Problem List   Diagnosis Date Noted  . Fournier's gangrene 01/08/2016  . Type 2 diabetes mellitus ([name]) 01/26/2013  . 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 Goiter 01/26/2013  . Hypertension 01/26/2013  . Acanthosis nigricans, acquired 01/26/2013  . Diabetes mellitus, insulin dependent (IDDM), uncontrolled ([place]) 01/09/2013  . Hyperglycemia 01/09/2013  . Obesity 01/09/2013  . Polyuria 01/09/2013  . Polydipsia 01/09/2013    Past Surgical History:  Procedure Laterality Date  . INCISION AND DRAINAGE PERIRECTAL ABSCESS N/A 01/07/2016   Procedure: INCISION AND DRAINAGE OF PERINEUM;  Surgeon: Ralene Ok, MD;  Location: [place];  Service: General;  Laterality: N/A;  . IRRIGATION AND DEBRIDEMENT ABSCESS Bilateral 01/08/2016   Procedure:  IRRIGATION AND DEBRIDEMENT GROIN;  Surgeon: Ralene Ok, MD;  Location: [place];  Service: General;  Laterality: Bilateral;       Home Medications    Prior to Admission medications   Medication Sig Start Date End Date Taking? Authorizing Provider  ACCU-CHEK FASTCLIX LANCETS MISC 1 each by Does not apply route as directed. Check sugar 6 x daily 01/10/13   [name], MD  acetaminophen (TYLENOL) 160 MG/5ML solution Take 20.3 mLs (650 mg total) by mouth every 6 (six) hours as needed for mild pain, moderate pain, headache or fever. 01/15/16   [name], MD  acetone, urine, test strip Check ketones per protocol 01/10/13   [name], MD  clindamycin (CLEOCIN) 300 MG capsule Take 1 capsule (300 mg total) by mouth 3 (three) times daily. 10/17/16   [name], MD  glucagon 1 MG injection Use for Severe Hypoglycemia . Inject '1mg'$  intramuscularly if unresponsive, unable to swallow, unconscious and/or has seizure 01/10/13   [name], MD  glucose blood (ACCU-CHEK SMARTVIEW) test strip Check sugar 6 x daily 01/10/13   [name], MD  glucose blood test strip Use as instructed 01/15/16   [name], MD  glucose monitoring kit (FREESTYLE) monitoring kit 1 each by Does not apply route as needed for other. 01/15/16   [name], MD  Insulin Glargine (LANTUS) 100 UNIT/ML Solostar Pen Inject 34 Units into the skin daily at 10 pm. 01/15/16   [name], MD  Insulin Pen Needle 31G X 5 MM MISC Daily with lantus  01/15/16   [name], MD  oxyCODONE (ROXICODONE) 5 MG immediate release tablet Take 1 tablet (5 mg total) by mouth every 6 (six) hours as needed for severe pain. 01/15/16   [name], MD    Family History Family History  Problem Relation Age of Onset  . Diabetes Mother   . Hypertension Mother   . Heart disease Mother   . Diabetes Father   . Arthritis Father   . Asthma Father   . Hypertension Father   . Heart disease Father     . Diabetes Sister     Social History Social History  Substance Use Topics  . Smoking status: Never Smoker  . Smokeless tobacco: Never Used  . Alcohol use No     Allergies   Amoxicillin   Review of Systems Review of Systems   Physical Exam Updated Vital Signs BP 126/77 (BP Location: Left Arm)   Pulse 111   Temp 98.1 F (36.7 C) (Oral)   Resp 17   Ht '5\' 11"'$  (1.803 m)   Wt 206 lb (93.4 kg)   SpO2 98%   BMI 28.73 kg/m   Physical Exam  Constitutional: He is oriented to person, place, and time. He appears well-developed and well-nourished. No distress.  HENT:  Head: Normocephalic and atraumatic.  Mouth/Throat: Uvula is midline and mucous membranes are normal. No trismus in the jaw. Abnormal dentition. Dental caries present. No dental abscesses.  Severe dental carries throughout. No gingival swelling or obvious abscess. No swelling under tongue.  Eyes: Conjunctivae are normal. Right eye exhibits no discharge. Left eye exhibits no discharge. No scleral icterus.  Cardiovascular: Normal rate.   Pulmonary/Chest: Effort normal.  Neurological: He is alert and oriented to person, place, and time. Coordination normal.  Skin: Skin is warm and dry. No rash noted. He is not diaphoretic. No erythema. No pallor.  Psychiatric: He has a normal mood and affect. His behavior is normal.  Nursing note and vitals reviewed.    ED Treatments / Results  DIAGNOSTIC STUDIES: Oxygen Saturation is 98% on RA, normal by my interpretation.   COORDINATION OF CARE: 10:11 AM- Will prescribe Clindamycin and give dental referral. Pt verbalizes understanding and agrees to plan.  Medications - No data to display  Labs (all labs ordered are listed, but only abnormal results are displayed) Labs Reviewed - No data to display  EKG  EKG Interpretation None       Radiology No results found.  Procedures Procedures (including critical care time)  Medications Ordered in ED Medications - No  data to display   Initial Impression / Assessment and Plan / ED Course  I have reviewed the triage vital signs and the nursing notes.  Pertinent labs & imaging results that were available during my care of the patient were reviewed by me and considered in my medical decision making (see chart for details).  Clinical Course    Patient with ongoing dentalgia, severe dental caries throughout. Poor dental hygeine.  No abscess requiring immediate incision and drainage.  Exam not concerning for Ludwig's angina or pharyngeal abscess.  Will treat with Clindamycin. Pt instructed to follow-up with dentist, resources given.  Discussed return precautions. Pt safe for discharge.   I personally performed the services described in this documentation, which was scribed in my presence. The recorded information has been reviewed and is accurate.  Final Clinical Impressions(s) / ED Diagnoses   Final diagnoses:  Pain due to dental caries    New  Prescriptions New Prescriptions   No medications on file     Carlos Levering, PA-C 10/23/16 [place], MD 10/23/16 [phone]

## 2017-09-15 ENCOUNTER — Encounter (HOSPITAL_COMMUNITY): Payer: Self-pay | Admitting: Emergency Medicine

## 2017-09-15 ENCOUNTER — Emergency Department (HOSPITAL_COMMUNITY)
Admission: EM | Admit: 2017-09-15 | Discharge: 2017-09-15 | Disposition: A | Discharge status: Home / Self Care | Payer: Self-pay | Attending: Emergency Medicine | Admitting: Emergency Medicine

## 2017-09-15 DIAGNOSIS — K029 Dental caries, unspecified: Secondary | ICD-10-CM | POA: Insufficient documentation

## 2017-09-15 DIAGNOSIS — Z794 Long term (current) use of insulin: Secondary | ICD-10-CM | POA: Insufficient documentation

## 2017-09-15 DIAGNOSIS — Z79899 Other long term (current) drug therapy: Secondary | ICD-10-CM | POA: Insufficient documentation

## 2017-09-15 DIAGNOSIS — E119 Type 2 diabetes mellitus without complications: Secondary | ICD-10-CM | POA: Insufficient documentation

## 2017-09-15 DIAGNOSIS — I10 Essential (primary) hypertension: Secondary | ICD-10-CM | POA: Insufficient documentation

## 2017-09-15 DIAGNOSIS — R000 Tachycardia, unspecified: Secondary | ICD-10-CM | POA: Insufficient documentation

## 2017-09-15 DIAGNOSIS — J45909 Unspecified asthma, uncomplicated: Secondary | ICD-10-CM | POA: Insufficient documentation

## 2017-09-15 MED ORDER — TRAMADOL HCL 50 MG PO TABS: 50.0000 mg | ORAL_TABLET | Freq: Four times a day (QID) | ORAL | 0 refills | Status: DC | PRN

## 2017-09-15 MED ORDER — CLINDAMYCIN HCL 150 MG PO CAPS: 300.0000 mg | ORAL_CAPSULE | Freq: Three times a day (TID) | ORAL | 0 refills | Status: DC

## 2017-09-15 NOTE — ED Triage Notes (Addendum)
Patient reports persistent right lower molars pain/swelling with dental cavity/poor dentition for several months.

## 2017-09-15 NOTE — ED Provider Notes (Signed)
[facility] DEPT Provider Note   CSN: [id number] Arrival date & time: 09/15/17  0703     History   Chief Complaint Chief Complaint  Patient presents with  . Dental Pain    HPI Shawn Harmon is a 21 y.o. male who presents to the emergency department with chief complaint of dental pain. Patient states that he has 3 teeth on the bottom right side that are rotten and frequently get infected. He is diabetic. He states that he has had 3 days of worsening pain in his bottom first through third right lower molars. He describes as constant, aching, 6 out of 10. He denies difficulty swallowing, fevers, shortness of breath, change in voice.  HPI  Past Medical History:  Diagnosis Date  . Allergy    seasonal  . Asthma   . Diabetes mellitus without complication [facility])     Patient Active Problem List   Diagnosis Date Noted  . Fournier's gangrene 01/08/2016  . Type 2 diabetes mellitus ([place]) 01/26/2013  . 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 Goiter 01/26/2013  . Hypertension 01/26/2013  . Acanthosis nigricans, acquired 01/26/2013  . Diabetes mellitus, insulin dependent (IDDM), uncontrolled ([place]) 01/09/2013  . Hyperglycemia 01/09/2013  . Obesity 01/09/2013  . Polyuria 01/09/2013  . Polydipsia 01/09/2013    Past Surgical History:  Procedure Laterality Date  . INCISION AND DRAINAGE PERIRECTAL ABSCESS N/A 01/07/2016   Procedure: INCISION AND DRAINAGE OF PERINEUM;  Surgeon: Ralene Ok, MD;  Location: [place];  Service: General;  Laterality: N/A;  . IRRIGATION AND DEBRIDEMENT ABSCESS Bilateral 01/08/2016   Procedure: IRRIGATION AND DEBRIDEMENT GROIN;  Surgeon: Ralene Ok, MD;  Location: [place];  Service: General;  Laterality: Bilateral;       Home Medications    Prior to Admission medications   Medication Sig Start Date End Date Taking? Authorizing Provider  ACCU-CHEK FASTCLIX LANCETS MISC 1 each by Does not apply route as directed. Check sugar 6 x daily 01/10/13    [name], MD  acetaminophen (TYLENOL) 160 MG/5ML solution Take 20.3 mLs (650 mg total) by mouth every 6 (six) hours as needed for mild pain, moderate pain, headache or fever. 01/15/16   Elgergawy, Silver Huguenin, MD  acetone, urine, test strip Check ketones per protocol 01/10/13   [name], MD  clindamycin (CLEOCIN) 150 MG capsule Take 2 capsules (300 mg total) by mouth 3 (three) times daily. May dispense as '150mg'$  capsules 09/15/17   Margarita Mail, PA-C  glucagon 1 MG injection Use for Severe Hypoglycemia . Inject '1mg'$  intramuscularly if unresponsive, unable to swallow, unconscious and/or has seizure 01/10/13   [name], MD  glucose blood (ACCU-CHEK SMARTVIEW) test strip Check sugar 6 x daily 01/10/13   [name], MD  glucose blood test strip Use as instructed 01/15/16   Elgergawy, Silver Huguenin, MD  glucose monitoring kit (FREESTYLE) monitoring kit 1 each by Does not apply route as needed for other. 01/15/16   Elgergawy, Silver Huguenin, MD  Insulin Glargine (LANTUS) 100 UNIT/ML Solostar Pen Inject 34 Units into the skin daily at 10 pm. 01/15/16   Elgergawy, Silver Huguenin, MD  Insulin Pen Needle 31G X 5 MM MISC Daily with lantus 01/15/16   Elgergawy, Silver Huguenin, MD  oxyCODONE (ROXICODONE) 5 MG immediate release tablet Take 1 tablet (5 mg total) by mouth every 6 (six) hours as needed for severe pain. 01/15/16   Elgergawy, Silver Huguenin, MD  traMADol (ULTRAM) 50 MG tablet Take 1 tablet (50  mg total) by mouth every 6 (six) hours as needed. 09/15/17   Margarita Mail, PA-C    Family History Family History  Problem Relation Age of Onset  . Diabetes Mother   . Hypertension Mother   . Heart disease Mother   . Diabetes Father   . Arthritis Father   . Asthma Father   . Hypertension Father   . Heart disease Father   . Diabetes Sister     Social History Social History  Substance Use Topics  . Smoking status: Never Smoker  . Smokeless tobacco: Never Used  . Alcohol use No     Allergies     Amoxicillin   Review of Systems Review of Systems Ten systems reviewed and are negative for acute change, except as noted in the HPI.   Physical Exam Updated Vital Signs BP (!) 128/93 (BP Location: Left Arm)   Pulse (!) 120   Temp 98.1 F (36.7 C) (Oral)   Resp 16   Ht '5\' 11"'$  (1.803 m)   Wt 93.4 kg (206 lb)   SpO2 96%   BMI 28.73 kg/m   Physical Exam  Constitutional: He appears well-developed and well-nourished. No distress.  HENT:  Head: Normocephalic and atraumatic.  Right lower first second and third molars are decayed and rotten down to the gumline., Is erythematous and tender to palpation without overt abscess. Oropharynx is clear and moist, uvula midline.  Eyes: Conjunctivae are normal. No scleral icterus.  Neck: Normal range of motion. Neck supple.  Cardiovascular: Normal rate, regular rhythm and normal heart sounds.   Pulmonary/Chest: Effort normal and breath sounds normal. No respiratory distress.  Abdominal: Soft. There is no tenderness.  Musculoskeletal: He exhibits no edema.  Neurological: He is alert.  Skin: Skin is warm and dry. He is not diaphoretic.  Psychiatric: His behavior is normal.  Nursing note and vitals reviewed.    ED Treatments / Results  Labs (all labs ordered are listed, but only abnormal results are displayed) Labs Reviewed - No data to display  EKG  EKG Interpretation None       Radiology No results found.  Procedures Procedures (including critical care time)  Medications Ordered in ED Medications - No data to display   Initial Impression / Assessment and Plan / ED Course  I have reviewed the triage vital signs and the nursing notes.  Pertinent labs & imaging results that were available during my care of the patient were reviewed by me and considered in my medical decision making (see chart for details).     Patient with dental infection, he is diabetic, tachycardic. I discussed that I felt getting lab work would  benefit the patient however the patient declines as he has to leave to get his mother from work. He is afebrile and hemodynamically stable otherwise. He has no signs of significant infection and no signs of Ludwig angina. The patient is well appearing on examination appears safe for discharge at this time. He is advised to follow with dentist. Discussed return precautions.  Final Clinical Impressions(s) / ED Diagnoses   Final diagnoses:  Pain due to dental caries    New Prescriptions Discharge Medication List as of 09/15/2017  9:41 AM    START taking these medications   Details  traMADol (ULTRAM) 50 MG tablet Take 1 tablet (50 mg total) by mouth every 6 (six) hours as needed., Starting Thu 09/15/2017, Print         Kenton Kingfisher, [place], PA-C 09/15/17 1624  Jola Schmidt, MD 09/15/17 [phone]

## 2017-09-15 NOTE — ED Notes (Signed)
HR reported to PA.

## 2017-09-15 NOTE — Discharge Instructions (Signed)
You have been seen by your caregiver because of dental pain.  °SEEK MEDICAL ATTENTION IF: °The exam and treatment you received today has been provided on an emergency basis only. This is not a substitute for complete medical or dental care. If your problem worsens or new symptoms (problems) appear, and you are unable to arrange prompt follow-up care with your dentist, call or return to this location. °CALL YOUR DENTIST OR RETURN IMMEDIATELY IF you develop a fever, rash, difficulty breathing or swallowing, neck or facial swelling, or other potentially serious concerns. ° ° °[employer]  °[employer]  °[facility]  °[address]  °[place], [place] [address]  °Phone [phone]  °[facility] [employer] [facility] in [place], [place], exemplifies the Dental School?s vision to improve the health and quality of life of all [place]ians by creating leaders with a passion to care for the underserved and by leading the nation in community-based, service learning oral health education. °We are committed to offering comprehensive general dental services for adults, children and special needs patients in a safe, caring and professional setting. ° °Appointments: Our clinic is open Monday through Friday 8:00 a.m. until 5:00 p.m. The amount of time scheduled for an appointment depends on the patient?s specific needs. We ask that you keep your appointed time for care or provide 24-hour notice of all appointment changes. Parents or legal guardians must accompany minor children. ° °Payment for Services: Medicaid and other insurance plans are welcome. Payment for services is due when services are rendered and may be made by cash or credit card. If you have dental insurance, we will assist you with your claim submission.  ° °Emergencies: Emergency services will be provided Monday through Friday on a  walk-in basis. Please arrive early for emergency services. After hours emergency services will be provided for patients of record as required. ° °Services:  °Comprehensive General Dentistry  °Children?s Dentistry  °Oral Surgery - Extractions  °Root Canals  °Sealants and Tooth Colored Fillings  °Crowns and Bridges  °Dentures and Partial Dentures  °Implant Services  °Periodontal Services and Cleanings  °Cosmetic Tooth Whitening  °Digital Radiography  °3-D/[facility] Beam Imaging ° ° °

## 2018-10-10 DIAGNOSIS — Z6824 Body mass index (BMI) 24.0-24.9, adult: Secondary | ICD-10-CM | POA: Diagnosis not present

## 2018-10-10 DIAGNOSIS — S29011A Strain of muscle and tendon of front wall of thorax, initial encounter: Secondary | ICD-10-CM | POA: Diagnosis not present

## 2018-10-10 DIAGNOSIS — M542 Cervicalgia: Secondary | ICD-10-CM | POA: Diagnosis not present

## 2018-10-12 DIAGNOSIS — H26492 Other secondary cataract, left eye: Secondary | ICD-10-CM | POA: Diagnosis not present

## 2018-10-12 DIAGNOSIS — Z961 Presence of intraocular lens: Secondary | ICD-10-CM | POA: Diagnosis not present

## 2018-10-12 DIAGNOSIS — E109 Type 1 diabetes mellitus without complications: Secondary | ICD-10-CM | POA: Diagnosis not present

## 2018-10-12 DIAGNOSIS — H25041 Posterior subcapsular polar age-related cataract, right eye: Secondary | ICD-10-CM | POA: Diagnosis not present

## 2018-10-12 LAB — HM DIABETES EYE EXAM

## 2018-10-13 ENCOUNTER — Ambulatory Visit: Payer: Self-pay | Admitting: Family

## 2018-10-17 ENCOUNTER — Ambulatory Visit: Payer: Medicaid Other | Admitting: Family Medicine

## 2018-10-17 ENCOUNTER — Encounter: Payer: Self-pay | Admitting: Family Medicine

## 2018-10-17 VITALS — BP 117/81 | HR 94 | Temp 98.6°F | Ht 71.0 in | Wt 174.4 lb

## 2018-10-17 DIAGNOSIS — Z0000 Encounter for general adult medical examination without abnormal findings: Secondary | ICD-10-CM | POA: Diagnosis not present

## 2018-10-17 DIAGNOSIS — IMO0001 Reserved for inherently not codable concepts without codable children: Secondary | ICD-10-CM

## 2018-10-17 DIAGNOSIS — Z7689 Persons encountering health services in other specified circumstances: Secondary | ICD-10-CM

## 2018-10-17 DIAGNOSIS — L819 Disorder of pigmentation, unspecified: Secondary | ICD-10-CM

## 2018-10-17 DIAGNOSIS — Z794 Long term (current) use of insulin: Secondary | ICD-10-CM

## 2018-10-17 DIAGNOSIS — Z6824 Body mass index (BMI) 24.0-24.9, adult: Secondary | ICD-10-CM | POA: Diagnosis not present

## 2018-10-17 DIAGNOSIS — L80 Vitiligo: Secondary | ICD-10-CM | POA: Insufficient documentation

## 2018-10-17 DIAGNOSIS — E1165 Type 2 diabetes mellitus with hyperglycemia: Secondary | ICD-10-CM

## 2018-10-17 LAB — BAYER DCA HB A1C WAIVED: HB A1C: 13.6 % — AB (ref <7.0)

## 2018-10-17 MED ORDER — GLUCOSE BLOOD VI STRP: ORAL_STRIP | 12 refills | Status: DC

## 2018-10-17 MED ORDER — ACCU-CHEK FASTCLIX LANCETS MISC: 1.0000 | 3 refills | Status: DC

## 2018-10-17 MED ORDER — FREESTYLE SYSTEM KIT: 1.0000 | PACK | 0 refills | Status: DC | PRN

## 2018-10-17 MED ORDER — SITAGLIPTIN PHOSPHATE 100 MG PO TABS: 100.0000 mg | ORAL_TABLET | Freq: Every day | ORAL | 0 refills | Status: DC

## 2018-10-17 NOTE — Addendum Note (Signed)
Addended by: Quay Burow on: 10/17/2018 04:28 PM   Modules accepted: Orders

## 2018-10-17 NOTE — Progress Notes (Signed)
Subjective:    Patient ID: Shawn Harmon, male    DOB: Sep 11, 1996, 21 y.o.   MRN: [id number]  Chief Complaint:  Establish Care   HPI: Shawn Harmon is a 22 y.o. male presenting on 10/17/2018 for Establish Care  Pt presents today to establish care. Pt has a history of uncontrolled diabetes mellitus with complications. Pt states he has not taken his diabetic medications in over 2 years. States he has not checked his blood sugars in over 2 years. States he has recently obtained insurance and is able to see a provider and be continue treatment for his diabetes.  Pt denies polyuria, polydipsia, increased appetite, or weakness. Denies headaches, numbness, or tingling. Pt states he has been watching his diet and exercising. Pt states he has lost over 30 pounds. Pt states he has cut out sodas, states he will have one a day at most - usually diet cheerwine. States he tries to avoid aspartame. Pt states he had three teeth pulled on Friday due to dental decay. States he has a follow-up appointment to get the remaining of his teeth fixed. He is currently on clindamycin from the procedure.  Pt states he had an eye exam on 10-12-18. Pt aware we need a copy of these records.  Pt states he has not seen podiatry in several years.  Pt states he has this discoloration of his skin, states this has been ongoing for several months. Denies pruritis.   Relevant past medical, surgical, family, and social history reviewed and updated as indicated.  Allergies and medications reviewed and updated.   Past Medical History:  Diagnosis Date  . Allergy    seasonal  . Asthma   . Cataract 2017   left eye; has consultation scheduled to remove cataract from right eye  . Diabetes mellitus without complication [facility])     Past Surgical History:  Procedure Laterality Date  . CATARACT EXTRACTION    . INCISION AND DRAINAGE PERIRECTAL ABSCESS N/A 01/07/2016   Procedure: INCISION AND DRAINAGE OF PERINEUM;  Surgeon:  Ralene Ok, MD;  Location: [place];  Service: General;  Laterality: N/A;  . IRRIGATION AND DEBRIDEMENT ABSCESS Bilateral 01/08/2016   Procedure: IRRIGATION AND DEBRIDEMENT GROIN;  Surgeon: Ralene Ok, MD;  Location: [place];  Service: General;  Laterality: Bilateral;    Social History   Socioeconomic History  . Marital status: Single    Spouse name: Not on file  . Number of children: Not on file  . Years of education: Not on file  . Highest education level: Not on file  Occupational History  . Not on file  Social Needs  . Financial resource strain: Not on file  . Food insecurity:    Worry: Not on file    Inability: Not on file  . Transportation needs:    Medical: Not on file    Non-medical: Not on file  Tobacco Use  . Smoking status: Never Smoker  . Smokeless tobacco: Never Used  Substance and Sexual Activity  . Alcohol use: No  . Drug use: No  . Sexual activity: Never  Lifestyle  . Physical activity:    Days per week: Not on file    Minutes per session: Not on file  . Stress: Not on file  Relationships  . Social connections:    Talks on phone: Not on file    Gets together: Not on file    Attends religious service: Not on file    Active member  of club or organization: Not on file    Attends meetings of clubs or organizations: Not on file    Relationship status: Not on file  . Intimate partner violence:    Fear of current or ex partner: Not on file    Emotionally abused: Not on file    Physically abused: Not on file    Forced sexual activity: Not on file  Other Topics Concern  . Not on file  Social History Narrative  . Not on file    Outpatient Encounter Medications as of 10/17/2018  Medication Sig  . clindamycin (CLEOCIN) 300 MG capsule Take 300 mg by mouth 2 (two) times daily. 1 tablet bid x 5 days  . cyclobenzaprine (FLEXERIL) 10 MG tablet Take 10 mg by mouth.  Marland Kitchen ibuprofen (ADVIL,MOTRIN) 800 MG tablet   . ACCU-CHEK FASTCLIX LANCETS MISC 1 each by Does  not apply route as directed. Check sugar 6 x daily  . glucose blood test strip Use as instructed  . glucose monitoring kit (FREESTYLE) monitoring kit 1 each by Does not apply route as needed for other.  . sitaGLIPtin (JANUVIA) 100 MG tablet Take 1 tablet (100 mg total) by mouth daily.  . [DISCONTINUED] ACCU-CHEK FASTCLIX LANCETS MISC 1 each by Does not apply route as directed. Check sugar 6 x daily  . [DISCONTINUED] acetaminophen (TYLENOL) 160 MG/5ML solution Take 20.3 mLs (650 mg total) by mouth every 6 (six) hours as needed for mild pain, moderate pain, headache or fever.  . [DISCONTINUED] acetone, urine, test strip Check ketones per protocol  . [DISCONTINUED] clindamycin (CLEOCIN) 150 MG capsule Take 2 capsules (300 mg total) by mouth 3 (three) times daily. May dispense as '150mg'$  capsules  . [DISCONTINUED] glucagon 1 MG injection Use for Severe Hypoglycemia . Inject '1mg'$  intramuscularly if unresponsive, unable to swallow, unconscious and/or has seizure  . [DISCONTINUED] glucose blood (ACCU-CHEK SMARTVIEW) test strip Check sugar 6 x daily  . [DISCONTINUED] glucose blood test strip Use as instructed  . [DISCONTINUED] glucose monitoring kit (FREESTYLE) monitoring kit 1 each by Does not apply route as needed for other.  . [DISCONTINUED] Insulin Glargine (LANTUS) 100 UNIT/ML Solostar Pen Inject 34 Units into the skin daily at 10 pm.  . [DISCONTINUED] Insulin Pen Needle 31G X 5 MM MISC Daily with lantus  . [DISCONTINUED] oxyCODONE (ROXICODONE) 5 MG immediate release tablet Take 1 tablet (5 mg total) by mouth every 6 (six) hours as needed for severe pain.  . [DISCONTINUED] traMADol (ULTRAM) 50 MG tablet Take 1 tablet (50 mg total) by mouth every 6 (six) hours as needed.   No facility-administered encounter medications on file as of 10/17/2018.     Allergies  Allergen Reactions  . Penicillins Anaphylaxis and Hives  . Amoxicillin Itching    Review of Systems  Constitutional: Negative for activity  change, appetite change, chills, fatigue and fever.  HENT: Positive for dental problem (multiple dental caries, recent dental extractions ).   Eyes: Negative for photophobia and visual disturbance.  Respiratory: Negative for cough, chest tightness and shortness of breath.   Cardiovascular: Negative for chest pain, palpitations and leg swelling.  Gastrointestinal: Negative for abdominal pain, constipation, diarrhea, nausea and vomiting.  Endocrine: Negative for cold intolerance, heat intolerance, polydipsia, polyphagia and polyuria.  Genitourinary: Negative for decreased urine volume, difficulty urinating and frequency.  Musculoskeletal: Negative for arthralgias, gait problem, joint swelling and myalgias.  Skin: Positive for color change (ongoing for several months ).  Neurological: Negative for dizziness, weakness, light-headedness, numbness  and headaches.  Psychiatric/Behavioral: Negative for confusion.  All other systems reviewed and are negative.       Objective:    BP 117/81   Pulse 94   Temp 98.6 F (37 C) (Oral)   Ht '5\' 11"'$  (1.803 m)   Wt 174 lb 6 oz (79.1 kg)   BMI 24.32 kg/m    Wt Readings from Last 3 Encounters:  10/17/18 174 lb 6 oz (79.1 kg)  09/15/17 206 lb (93.4 kg)  10/23/16 206 lb (93.4 kg)    Physical Exam  Constitutional: He is oriented to person, place, and time. He appears well-developed and well-nourished. He is cooperative. No distress.  HENT:  Head: Normocephalic and atraumatic.  Right Ear: Hearing, tympanic membrane, external ear and ear canal normal.  Left Ear: Hearing, tympanic membrane, external ear and ear canal normal.  Nose: Nose normal.  Mouth/Throat: Uvula is midline, oropharynx is clear and moist and mucous membranes are normal. Dental caries present. No dental abscesses. No tonsillar exudate.  Eyes: Pupils are equal, round, and reactive to light. Conjunctivae, EOM and lids are normal.  Neck: Normal range of motion. Neck supple. No JVD  present. Carotid bruit is not present. No tracheal deviation present. No thyroid mass and no thyromegaly present.  Cardiovascular: Normal rate, regular rhythm, normal heart sounds and intact distal pulses. Exam reveals no gallop and no friction rub.  No murmur heard. Pulses:      Dorsalis pedis pulses are 2+ on the right side, and 2+ on the left side.       Posterior tibial pulses are 2+ on the right side, and 2+ on the left side.  Pulmonary/Chest: Effort normal and breath sounds normal. No respiratory distress. He has no wheezes. He has no rhonchi. He has no rales.  Abdominal: Soft. Normal appearance and bowel sounds are normal. There is no hepatosplenomegaly. There is no tenderness. There is no CVA tenderness. No hernia.  Musculoskeletal: Normal range of motion.       Right foot: There is normal range of motion.       Left foot: There is normal range of motion.  Feet:  Right Foot:  Skin Integrity: Negative for ulcer, blister, skin breakdown, erythema, warmth, callus or dry skin.  Left Foot:  Skin Integrity: Negative for ulcer, blister, skin breakdown, erythema, warmth, callus or dry skin.  Lymphadenopathy:    He has no cervical adenopathy.  Neurological: He is alert and oriented to person, place, and time. He has normal strength and normal reflexes. No cranial nerve deficit or sensory deficit.  Skin: Skin is warm and dry. Capillary refill takes less than 2 seconds.  Hypopigmentation to forehead, back of neck, upper back and upper chest, bilateral arms, bilateral hands, and bilateral upper legs. When areas illuminated with Darliss Cheney they emit a bright blue-white fluorescence and areas are sharply demarcated.  Striae to abdomen, bilateral flanks, bilateral axilla, and bilateral anterior shoulders.  Thickening and yellow discoloration of toenails.   Psychiatric: He has a normal mood and affect. His speech is normal and behavior is normal. Judgment and thought content normal. Cognition and  memory are normal.  Nursing note and vitals reviewed.   A1C 13.6 in office today.   Pertinent labs & imaging results that were available during my care of the patient were reviewed by me and considered in my medical decision making.  Assessment & Plan:  Kavish was seen today for establish care.  Diagnoses and all orders for this visit:  Encounter to establish care  Physical exam -     CMP14+EGFR -     Lipid panel -     CBC with Differential/Platelet -     TSH -     Microalbumin / creatinine urine ratio  Diabetes mellitus, insulin dependent (IDDM), uncontrolled ([place]) Has been without medications for over 2 years. Will restart on monotherapy and titrate as needed. Was unable to tolerate metformin in the past, tolerated Januvia well.  Continue diet and exercise. Avoid sugary beverages and foods. Monitor carbohydrate intake. Start checking blood sugars at different times per day and keep a record. Bring this to next visit. Follow up in 4 weeks for repeat A1C to determine next steps in treatment.  Diabetic educator referral, podiatry referral -     CMP14+EGFR -     TSH -     Microalbumin / creatinine urine ratio -     ACCU-CHEK FASTCLIX LANCETS MISC; 1 each by Does not apply route as directed. Check sugar 6 x daily -     glucose monitoring kit (FREESTYLE) monitoring kit; 1 each by Does not apply route as needed for other. -     glucose blood test strip; Use as instructed -     sitaGLIPtin (JANUVIA) 100 MG tablet; Take 1 tablet (100 mg total) by mouth daily. 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 Wood's Lamp over areas of depigmentation emitted a bright blue-white fluorescence and areas were well demarcated. Will refer to dermatology. TSH today -     Ambulatory referral to Dermatology    Continue all other maintenance medications.  Follow up plan: Return in about 4 weeks  (around 11/14/2018), or if symptoms worsen or fail to improve.  Educational handout given for diabetes nutrition, diabetes diet and exercise, diabetic foot care  The above assessment and management plan was discussed with the patient. The patient verbalized understanding of and has agreed to the management plan. Patient is aware to call the clinic if symptoms persist or worsen. Patient is aware when to return to the clinic for a follow-up visit. Patient educated on when it is appropriate to go to the emergency department.   Monia Pouch, FNP-C [facility] [phone]

## 2018-10-17 NOTE — Patient Instructions (Signed)
Diabetes Mellitus and Nutrition When you have diabetes (diabetes mellitus), it is very important to have healthy eating habits because your blood sugar (glucose) levels are greatly affected by what you eat and drink. Eating healthy foods in the appropriate amounts, at about the same times every day, can help you:  Control your blood glucose.  Lower your risk of heart disease.  Improve your blood pressure.  Reach or maintain a healthy weight.  Every person with diabetes is different, and each person has different needs for a meal plan. Your health care provider may recommend that you work with a diet and nutrition specialist (dietitian) to make a meal plan that is best for you. Your meal plan may vary depending on factors such as:  The calories you need.  The medicines you take.  Your weight.  Your blood glucose, blood pressure, and cholesterol levels.  Your activity level.  Other health conditions you have, such as heart or kidney disease.  How do carbohydrates affect me? Carbohydrates affect your blood glucose level more than any other type of food. Eating carbohydrates naturally increases the amount of glucose in your blood. Carbohydrate counting is a method for keeping track of how many carbohydrates you eat. Counting carbohydrates is important to keep your blood glucose at a healthy level, especially if you use insulin or take certain oral diabetes medicines. It is important to know how many carbohydrates you can safely have in each meal. This is different for every person. Your dietitian can help you calculate how many carbohydrates you should have at each meal and for snack. Foods that contain carbohydrates include:  Bread, cereal, rice, pasta, and crackers.  Potatoes and corn.  Peas, beans, and lentils.  Milk and yogurt.  Fruit and juice.  Desserts, such as cakes, cookies, ice cream, and candy.  How does alcohol affect me? Alcohol can cause a sudden decrease in blood  glucose (hypoglycemia), especially if you use insulin or take certain oral diabetes medicines. Hypoglycemia can be a life-threatening condition. Symptoms of hypoglycemia (sleepiness, dizziness, and confusion) are similar to symptoms of having too much alcohol. If your health care provider says that alcohol is safe for you, follow these guidelines:  Limit alcohol intake to no more than 1 drink per day for nonpregnant women and 2 drinks per day for men. One drink equals 12 oz of beer, 5 oz of wine, or 1 oz of hard liquor.  Do not drink on an empty stomach.  Keep yourself hydrated with water, diet soda, or unsweetened iced tea.  Keep in mind that regular soda, juice, and other mixers may contain a lot of sugar and must be counted as carbohydrates.  What are tips for following this plan? Reading food labels  Start by checking the serving size on the label. The amount of calories, carbohydrates, fats, and other nutrients listed on the label are based on one serving of the food. Many foods contain more than one serving per package.  Check the total grams (g) of carbohydrates in one serving. You can calculate the number of servings of carbohydrates in one serving by dividing the total carbohydrates by 15. For example, if a food has 30 g of total carbohydrates, it would be equal to 2 servings of carbohydrates.  Check the number of grams (g) of saturated and trans fats in one serving. Choose foods that have low or no amount of these fats.  Check the number of milligrams (mg) of sodium in one serving. Most people   should limit total sodium intake to less than 2,300 mg per day.  Always check the nutrition information of foods labeled as "low-fat" or "nonfat". These foods may be higher in added sugar or refined carbohydrates and should be avoided.  Talk to your dietitian to identify your daily goals for nutrients listed on the label. Shopping  Avoid buying canned, premade, or processed foods. These  foods tend to be high in fat, sodium, and added sugar.  Shop around the outside edge of the grocery store. This includes fresh fruits and vegetables, bulk grains, fresh meats, and fresh dairy. Cooking  Use low-heat cooking methods, such as baking, instead of high-heat cooking methods like deep frying.  Cook using healthy oils, such as olive, canola, or sunflower oil.  Avoid cooking with butter, cream, or high-fat meats. Meal planning  Eat meals and snacks regularly, preferably at the same times every day. Avoid going long periods of time without eating.  Eat foods high in fiber, such as fresh fruits, vegetables, beans, and whole grains. Talk to your dietitian about how many servings of carbohydrates you can eat at each meal.  Eat 4-6 ounces of lean protein each day, such as lean meat, chicken, fish, eggs, or tofu. 1 ounce is equal to 1 ounce of meat, chicken, or fish, 1 egg, or 1/4 cup of tofu.  Eat some foods each day that contain healthy fats, such as avocado, nuts, seeds, and fish. Lifestyle   Check your blood glucose regularly.  Exercise at least 30 minutes 5 or more days each week, or as told by your health care provider.  Take medicines as told by your health care provider.  Do not use any products that contain nicotine or tobacco, such as cigarettes and e-cigarettes. If you need help quitting, ask your health care provider.  Work with a counselor or diabetes educator to identify strategies to manage stress and any emotional and social challenges. What are some questions to ask my health care provider?  Do I need to meet with a diabetes educator?  Do I need to meet with a dietitian?  What number can I call if I have questions?  When are the best times to check my blood glucose? Where to find more information:  American Diabetes Association: diabetes.org/food-and-fitness/food  Academy of Nutrition and Dietetics:  www.eatright.org/resources/health/diseases-and-conditions/diabetes  National Institute of Diabetes and Digestive and Kidney Diseases (NIH): www.niddk.nih.gov/health-information/diabetes/overview/diet-eating-physical-activity Summary  A healthy meal plan will help you control your blood glucose and maintain a healthy lifestyle.  Working with a diet and nutrition specialist (dietitian) can help you make a meal plan that is best for you.  Keep in mind that carbohydrates and alcohol have immediate effects on your blood glucose levels. It is important to count carbohydrates and to use alcohol carefully. This information is not intended to replace advice given to you by your health care provider. Make sure you discuss any questions you have with your health care provider. Document Released: 09/09/2005 Document Revised: 01/17/2017 Document Reviewed: 01/17/2017 Elsevier Interactive Patient Education  2018 [employer] Diabetes and Foot Care Diabetes may cause you to have problems because of poor blood supply (circulation) to your feet and legs. This may cause the skin on your feet to become thinner, break easier, and heal more slowly. Your skin may become dry, and the skin may peel and crack. You may also have nerve damage in your legs and feet causing decreased feeling in them. You may not notice minor injuries to your feet that   could lead to infections or more serious problems. Taking care of your feet is one of the most important things you can do for yourself. Follow these instructions at home:  Wear shoes at all times, even in the house. Do not go barefoot. Bare feet are easily injured.  Check your feet daily for blisters, cuts, and redness. If you cannot see the bottom of your feet, use a mirror or ask someone for help.  Wash your feet with warm water (do not use hot water) and mild soap. Then pat your feet and the areas between your toes until they are completely dry. Do not soak your feet as  this can dry your skin.  Apply a moisturizing lotion or petroleum jelly (that does not contain alcohol and is unscented) to the skin on your feet and to dry, brittle toenails. Do not apply lotion between your toes.  Trim your toenails straight across. Do not dig under them or around the cuticle. File the edges of your nails with an emery board or nail file.  Do not cut corns or calluses or try to remove them with medicine.  Wear clean socks or stockings every day. Make sure they are not too tight. Do not wear knee-high stockings since they may decrease blood flow to your legs.  Wear shoes that fit properly and have enough cushioning. To break in new shoes, wear them for just a few hours a day. This prevents you from injuring your feet. Always look in your shoes before you put them on to be sure there are no objects inside.  Do not cross your legs. This may decrease the blood flow to your feet.  If you find a minor scrape, cut, or break in the skin on your feet, keep it and the skin around it clean and dry. These areas may be cleansed with mild soap and water. Do not cleanse the area with peroxide, alcohol, or iodine.  When you remove an adhesive bandage, be sure not to damage the skin around it.  If you have a wound, look at it several times a day to make sure it is healing.  Do not use heating pads or hot water bottles. They may burn your skin. If you have lost feeling in your feet or legs, you may not know it is happening until it is too late.  Make sure your health care provider performs a complete foot exam at least annually or more often if you have foot problems. Report any cuts, sores, or bruises to your health care provider immediately. Contact a health care provider if:  You have an injury that is not healing.  You have cuts or breaks in the skin.  You have an ingrown nail.  You notice redness on your legs or feet.  You feel burning or tingling in your legs or feet.  You  have pain or cramps in your legs and feet.  Your legs or feet are numb.  Your feet always feel cold. Get help right away if:  There is increasing redness, swelling, or pain in or around a wound.  There is a red line that goes up your leg.  Pus is coming from a wound.  You develop a fever or as directed by your health care provider.  You notice a bad smell coming from an ulcer or wound. This information is not intended to replace advice given to you by your health care provider. Make sure you discuss any questions you have   with your health care provider. Document Released: 12/10/2000 Document Revised: 05/20/2016 Document Reviewed: 05/22/2013 Elsevier Interactive Patient Education  2017 [employer] Diabetes Mellitus and Exercise Exercising regularly is important for your overall health, especially when you have diabetes (diabetes mellitus). Exercising is not only about losing weight. It has many health benefits, such as increasing muscle strength and bone density and reducing body fat and stress. This leads to improved fitness, flexibility, and endurance, all of which result in better overall health. Exercise has additional benefits for people with diabetes, including:  Reducing appetite.  Helping to lower and control blood glucose.  Lowering blood pressure.  Helping to control amounts of fatty substances (lipids) in the blood, such as cholesterol and triglycerides.  Helping the body to respond better to insulin (improving insulin sensitivity).  Reducing how much insulin the body needs.  Decreasing the risk for heart disease by: ? Lowering cholesterol and triglyceride levels. ? Increasing the levels of good cholesterol. ? Lowering blood glucose levels.  What is my activity plan? Your health care provider or certified diabetes educator can help you make a plan for the type and frequency of exercise (activity plan) that works for you. Make sure that you:  Do at least 150  minutes of moderate-intensity or vigorous-intensity exercise each week. This could be brisk walking, biking, or water aerobics. ? Do stretching and strength exercises, such as yoga or weightlifting, at least 2 times a week. ? Spread out your activity over at least 3 days of the week.  Get some form of physical activity every day. ? Do not go more than 2 days in a row without some kind of physical activity. ? Avoid being inactive for more than 90 minutes at a time. Take frequent breaks to walk or stretch.  Choose a type of exercise or activity that you enjoy, and set realistic goals.  Start slowly, and gradually increase the intensity of your exercise over time.  What do I need to know about managing my diabetes?  Check your blood glucose before and after exercising. ? If your blood glucose is higher than 240 mg/dL (13.3 mmol/L) before you exercise, check your urine for ketones. If you have ketones in your urine, do not exercise until your blood glucose returns to normal.  Know the symptoms of low blood glucose (hypoglycemia) and how to treat it. Your risk for hypoglycemia increases during and after exercise. Common symptoms of hypoglycemia can include: ? Hunger. ? Anxiety. ? Sweating and feeling clammy. ? Confusion. ? Dizziness or feeling light-headed. ? Increased heart rate or palpitations. ? Blurry vision. ? Tingling or numbness around the mouth, lips, or tongue. ? Tremors or shakes. ? Irritability.  Keep a rapid-acting carbohydrate snack available before, during, and after exercise to help prevent or treat hypoglycemia.  Avoid injecting insulin into areas of the body that are going to be exercised. For example, avoid injecting insulin into: ? The arms, when playing tennis. ? The legs, when jogging.  Keep records of your exercise habits. Doing this can help you and your health care provider adjust your diabetes management plan as needed. Write down: ? Food that you eat before  and after you exercise. ? Blood glucose levels before and after you exercise. ? The type and amount of exercise you have done. ? When your insulin is expected to peak, if you use insulin. Avoid exercising at times when your insulin is peaking.  When you start a new exercise or activity, work with your health   care provider to make sure the activity is safe for you, and to adjust your insulin, medicines, or food intake as needed.  Drink plenty of water while you exercise to prevent dehydration or heat stroke. Drink enough fluid to keep your urine clear or pale yellow. This information is not intended to replace advice given to you by your health care provider. Make sure you discuss any questions you have with your health care provider. Document Released: 03/04/2004 Document Revised: 07/02/2016 Document Reviewed: 05/24/2016 Elsevier Interactive Patient Education  2018 [employer]  

## 2018-10-17 NOTE — Addendum Note (Signed)
Addended by: Orma Render F on: 10/17/2018 04:27 PM   Modules accepted: Orders

## 2018-10-18 ENCOUNTER — Telehealth: Payer: Self-pay | Admitting: Family Medicine

## 2018-10-18 LAB — CMP14+EGFR
A/G RATIO: 1.6 (ref 1.2–2.2)
ALBUMIN: 4.5 g/dL (ref 3.5–5.5)
ALT: 24 IU/L (ref 0–44)
AST: 9 IU/L (ref 0–40)
Alkaline Phosphatase: 72 IU/L (ref 39–117)
BUN / CREAT RATIO: 14 (ref 9–20)
BUN: 12 mg/dL (ref 6–20)
Bilirubin Total: 0.5 mg/dL (ref 0.0–1.2)
CALCIUM: 10 mg/dL (ref 8.7–10.2)
CO2: 25 mmol/L (ref 20–29)
Chloride: 100 mmol/L (ref 96–106)
Creatinine, Ser: 0.83 mg/dL (ref 0.76–1.27)
GFR, EST AFRICAN AMERICAN: 144 mL/min/{1.73_m2} (ref >59)
GFR, EST NON AFRICAN AMERICAN: 125 mL/min/{1.73_m2} (ref >59)
GLOBULIN, TOTAL: 2.8 g/dL (ref 1.5–4.5)
Glucose: 267 mg/dL — ABNORMAL HIGH (ref 65–99)
POTASSIUM: 4.6 mmol/L (ref 3.5–5.2)
SODIUM: 142 mmol/L (ref 134–144)
Total Protein: 7.3 g/dL (ref 6.0–8.5)

## 2018-10-18 LAB — CBC WITH DIFFERENTIAL/PLATELET
BASOS ABS: 0 10*3/uL (ref 0.0–0.2)
Basos: 0 %
EOS (ABSOLUTE): 0.1 10*3/uL (ref 0.0–0.4)
Eos: 1 %
HEMOGLOBIN: 16 g/dL (ref 13.0–17.7)
Hematocrit: 49.1 % (ref 37.5–51.0)
IMMATURE GRANS (ABS): 0 10*3/uL (ref 0.0–0.1)
IMMATURE GRANULOCYTES: 0 %
LYMPHS: 40 %
Lymphocytes Absolute: 2.4 10*3/uL (ref 0.7–3.1)
MCH: 26 pg — ABNORMAL LOW (ref 26.6–33.0)
MCHC: 32.6 g/dL (ref 31.5–35.7)
MCV: 80 fL (ref 79–97)
MONOCYTES: 8 %
Monocytes Absolute: 0.5 10*3/uL (ref 0.1–0.9)
NEUTROS PCT: 51 %
Neutrophils Absolute: 3 10*3/uL (ref 1.4–7.0)
PLATELETS: 233 10*3/uL (ref 150–450)
RBC: 6.16 x10E6/uL — AB (ref 4.14–5.80)
RDW: 13.1 % (ref 12.3–15.4)
WBC: 6 10*3/uL (ref 3.4–10.8)

## 2018-10-18 LAB — MICROALBUMIN / CREATININE URINE RATIO
CREATININE, UR: 96.8 mg/dL
MICROALB/CREAT RATIO: 283 mg/g{creat} — AB (ref 0.0–30.0)
Microalbumin, Urine: 273.9 ug/mL

## 2018-10-18 LAB — LIPID PANEL
CHOL/HDL RATIO: 6 ratio — AB (ref 0.0–5.0)
Cholesterol, Total: 198 mg/dL (ref 100–199)
HDL: 33 mg/dL — ABNORMAL LOW (ref >39)
LDL CALC: 114 mg/dL — AB (ref 0–99)
Triglycerides: 257 mg/dL — ABNORMAL HIGH (ref 0–149)
VLDL Cholesterol Cal: 51 mg/dL — ABNORMAL HIGH (ref 5–40)

## 2018-10-18 LAB — TSH: TSH: 1.45 u[IU]/mL (ref 0.450–4.500)

## 2018-10-18 NOTE — Telephone Encounter (Signed)
Called patient to determine if insurance gave the name of a medication that is covered.  No answer, unable to leave voicemail.  Will forward to Kari Baars, FNP in regards to changing medication.

## 2018-10-19 ENCOUNTER — Other Ambulatory Visit: Payer: Self-pay | Admitting: *Deleted

## 2018-10-19 ENCOUNTER — Other Ambulatory Visit: Payer: Self-pay | Admitting: Family Medicine

## 2018-10-19 DIAGNOSIS — Z794 Long term (current) use of insulin: Secondary | ICD-10-CM

## 2018-10-19 DIAGNOSIS — R809 Proteinuria, unspecified: Secondary | ICD-10-CM

## 2018-10-19 DIAGNOSIS — E1169 Type 2 diabetes mellitus with other specified complication: Secondary | ICD-10-CM

## 2018-10-19 DIAGNOSIS — E785 Hyperlipidemia, unspecified: Secondary | ICD-10-CM

## 2018-10-19 DIAGNOSIS — E1129 Type 2 diabetes mellitus with other diabetic kidney complication: Secondary | ICD-10-CM

## 2018-10-19 DIAGNOSIS — E1165 Type 2 diabetes mellitus with hyperglycemia: Secondary | ICD-10-CM

## 2018-10-19 DIAGNOSIS — IMO0001 Reserved for inherently not codable concepts without codable children: Secondary | ICD-10-CM

## 2018-10-19 MED ORDER — SEMAGLUTIDE(0.25 OR 0.5MG/DOS) 2 MG/1.5ML ~~LOC~~ SOPN: PEN_INJECTOR | SUBCUTANEOUS | 3 refills | Status: DC

## 2018-10-19 MED ORDER — METFORMIN HCL ER 750 MG PO TB24: 750.0000 mg | ORAL_TABLET | Freq: Every day | ORAL | 3 refills | Status: DC

## 2018-10-19 MED ORDER — ENALAPRIL MALEATE 2.5 MG PO TABS: 2.5000 mg | ORAL_TABLET | Freq: Every day | ORAL | 3 refills | Status: DC

## 2018-10-19 MED ORDER — RED YEAST RICE 600 MG PO CAPS: 2400.0000 mg | ORAL_CAPSULE | Freq: Every day | ORAL | 3 refills | Status: AC

## 2018-10-19 MED ORDER — RED YEAST RICE 600 MG PO CAPS: 2400.0000 mg | ORAL_CAPSULE | Freq: Every day | ORAL | 3 refills | Status: DC

## 2018-10-19 NOTE — Telephone Encounter (Signed)
Januvia cancelled due to coverage. Will trial metformin again and will add ozempic weekly. Will not use SGLT2 due to previous history of fournier's gangrene.  Enalapril low dose added for renoprotection. Red yeast rice for hyperlipidemia. Will recheck labs in three months.   Please let him know to take the metformin with food to decrease GI side effects. If unable to tolerate, please let us know.  Continue diet and exercise. Return to office in one month after starting the medications to recheck A1C.

## 2018-10-19 NOTE — Progress Notes (Unsigned)
Januvia cancelled due to coverage. Will trial metformin again and will add ozempic weekly. Will not use SGLT2 due to previous history of fournier's gangrene.  Enalapril low dose added for renoprotection. Red yeast rice for hyperlipidemia. Will recheck labs in three months.

## 2018-10-19 NOTE — Telephone Encounter (Signed)
Spoke with patient.  He states that per his insurance Januvia is a covered medication, but per [facility] it was not.  He states he wants to find out for sure before starting metformin and Oxempic.  Per patient's request sent enalapril and red yeast rice to [facility] in [place].  Patient states he will call back to let us know whether he wants to take Januvia or Metformin and Ozempic. Notified patient of all lab results.

## 2018-10-30 ENCOUNTER — Other Ambulatory Visit: Payer: Self-pay | Admitting: Family Medicine

## 2018-10-30 DIAGNOSIS — L80 Vitiligo: Secondary | ICD-10-CM | POA: Diagnosis not present

## 2018-10-30 DIAGNOSIS — E1129 Type 2 diabetes mellitus with other diabetic kidney complication: Secondary | ICD-10-CM

## 2018-10-30 DIAGNOSIS — D225 Melanocytic nevi of trunk: Secondary | ICD-10-CM | POA: Diagnosis not present

## 2018-10-30 DIAGNOSIS — Z794 Long term (current) use of insulin: Principal | ICD-10-CM

## 2018-10-30 DIAGNOSIS — E1165 Type 2 diabetes mellitus with hyperglycemia: Secondary | ICD-10-CM

## 2018-10-30 DIAGNOSIS — IMO0001 Reserved for inherently not codable concepts without codable children: Secondary | ICD-10-CM

## 2018-10-30 DIAGNOSIS — R809 Proteinuria, unspecified: Principal | ICD-10-CM

## 2018-10-30 MED ORDER — SEMAGLUTIDE(0.25 OR 0.5MG/DOS) 2 MG/1.5ML ~~LOC~~ SOPN: PEN_INJECTOR | SUBCUTANEOUS | 3 refills | Status: AC

## 2018-10-30 MED ORDER — FREESTYLE LIBRE 14 DAY SENSOR MISC: 1.0000 | 3 refills | Status: DC

## 2018-10-30 MED ORDER — METFORMIN HCL ER 750 MG PO TB24: 750.0000 mg | ORAL_TABLET | Freq: Every day | ORAL | 3 refills | Status: DC

## 2018-10-30 MED ORDER — FREESTYLE LIBRE 14 DAY READER DEVI: 1.0000 | 0 refills | Status: DC

## 2018-10-31 DIAGNOSIS — H26492 Other secondary cataract, left eye: Secondary | ICD-10-CM | POA: Diagnosis not present

## 2018-11-06 DIAGNOSIS — D225 Melanocytic nevi of trunk: Secondary | ICD-10-CM | POA: Diagnosis not present

## 2018-11-07 DIAGNOSIS — B351 Tinea unguium: Secondary | ICD-10-CM | POA: Diagnosis not present

## 2018-11-07 DIAGNOSIS — M79676 Pain in unspecified toe(s): Secondary | ICD-10-CM | POA: Diagnosis not present

## 2018-11-09 DIAGNOSIS — D485 Neoplasm of uncertain behavior of skin: Secondary | ICD-10-CM | POA: Diagnosis not present

## 2018-11-20 ENCOUNTER — Ambulatory Visit: Payer: BLUE CROSS/BLUE SHIELD | Admitting: Physician Assistant

## 2018-11-21 ENCOUNTER — Ambulatory Visit: Payer: BLUE CROSS/BLUE SHIELD | Admitting: Family Medicine

## 2018-11-21 ENCOUNTER — Encounter: Payer: Self-pay | Admitting: Family Medicine

## 2018-11-21 ENCOUNTER — Other Ambulatory Visit: Payer: Self-pay | Admitting: Family Medicine

## 2018-11-21 VITALS — BP 123/87 | HR 126 | Temp 97.7°F | Ht 71.0 in | Wt 169.0 lb

## 2018-11-21 DIAGNOSIS — E1141 Type 2 diabetes mellitus with diabetic mononeuropathy: Secondary | ICD-10-CM | POA: Diagnosis not present

## 2018-11-21 DIAGNOSIS — K219 Gastro-esophageal reflux disease without esophagitis: Secondary | ICD-10-CM | POA: Insufficient documentation

## 2018-11-21 DIAGNOSIS — E785 Hyperlipidemia, unspecified: Secondary | ICD-10-CM | POA: Insufficient documentation

## 2018-11-21 DIAGNOSIS — K580 Irritable bowel syndrome with diarrhea: Secondary | ICD-10-CM

## 2018-11-21 DIAGNOSIS — R809 Proteinuria, unspecified: Secondary | ICD-10-CM

## 2018-11-21 DIAGNOSIS — F320 Major depressive disorder, single episode, mild: Secondary | ICD-10-CM

## 2018-11-21 DIAGNOSIS — E1129 Type 2 diabetes mellitus with other diabetic kidney complication: Secondary | ICD-10-CM

## 2018-11-21 DIAGNOSIS — Z23 Encounter for immunization: Secondary | ICD-10-CM

## 2018-11-21 DIAGNOSIS — E1169 Type 2 diabetes mellitus with other specified complication: Secondary | ICD-10-CM | POA: Insufficient documentation

## 2018-11-21 LAB — BAYER DCA HB A1C WAIVED: HB A1C (BAYER DCA - WAIVED): 11.1 % — ABNORMAL HIGH (ref <7.0)

## 2018-11-21 MED ORDER — CHOLESTYRAMINE LIGHT 4 G PO PACK: 4.0000 g | PACK | Freq: Two times a day (BID) | ORAL | 3 refills | Status: DC

## 2018-11-21 MED ORDER — DULOXETINE HCL 30 MG PO CPEP: 60.0000 mg | ORAL_CAPSULE | Freq: Every day | ORAL | 3 refills | Status: DC

## 2018-11-21 MED ORDER — OMEPRAZOLE 20 MG PO CPDR: 20.0000 mg | DELAYED_RELEASE_CAPSULE | Freq: Every day | ORAL | 3 refills | Status: DC

## 2018-11-21 NOTE — Progress Notes (Signed)
Subjective:    Patient ID: Shawn Harmon, male    DOB: 03-16-1996, 22 y.o.   MRN: [id number]  Chief Complaint:  Diabetes (follow up) and Abdominal Pain, nausea, diarrhea   HPI: Shawn Harmon is a 22 y.o. male presenting on 11/21/2018 for Diabetes (follow up) and Abdominal Pain, nausea, diarrhea  Diabetes Mellitus Type 2 follow up Compliant with meds - No, states he is not taking the metformin, red yeast rice, or enalapril.  Checking CBGs? No Exercising regularly? - No Watching carbohydrate intake? - Yes Neuropathy ? - Yes Hypoglycemic events - No Pertinent ROS:  Polyuria - No Polydipsia - No Vision problems - No  Pt also reports tingling to bilateral flanks. States this has been ongoing for several months and is constant. He denies a rash or recent injury.  Pt also reports abdominal cramping with diarrhea after every meal. States this has been ongoing for at least 8 months. Denies family history of IBD or colorectal cancer. Denies blood in stool, unexplained weight loss, fever, or chills. Pt states he has at least 5 diarrhea stools per day. Pt states he also has water brash on a daily basis. He denies sore throat or cough.  Pt states he has been feeling less energetic and slightly depressed. States he has been sleeping more than usual over the last several months. Denies SI / HI.   Office Visit from 11/21/2018 in [facility]  PHQ-9 Total Score  8      Relevant past medical, surgical, family, and social history reviewed and updated as indicated.  Allergies and medications reviewed and updated.   Past Medical History:  Diagnosis Date  . Allergy    seasonal  . Asthma   . Cataract 2017   left eye; has consultation scheduled to remove cataract from right eye  . Diabetes mellitus without complication [facility])     Past Surgical History:  Procedure Laterality Date  . CATARACT EXTRACTION    . INCISION AND DRAINAGE PERIRECTAL ABSCESS N/A 01/07/2016   Procedure: INCISION AND DRAINAGE OF PERINEUM;  Surgeon: Ralene Ok, MD;  Location: [facility];  Service: General;  Laterality: N/A;  . IRRIGATION AND DEBRIDEMENT ABSCESS Bilateral 01/08/2016   Procedure: IRRIGATION AND DEBRIDEMENT GROIN;  Surgeon: Ralene Ok, MD;  Location: [facility];  Service: General;  Laterality: Bilateral;    Social History   Socioeconomic History  . Marital status: Single    Spouse name: Not on file  . Number of children: Not on file  . Years of education: Not on file  . Highest education level: Not on file  Occupational History  . Not on file  Social Needs  . Financial resource strain: Not on file  . Food insecurity:    Worry: Not on file    Inability: Not on file  . Transportation needs:    Medical: Not on file    Non-medical: Not on file  Tobacco Use  . Smoking status: Never Smoker  . Smokeless tobacco: Never Used  Substance and Sexual Activity  . Alcohol use: No  . Drug use: No  . Sexual activity: Never  Lifestyle  . Physical activity:    Days per week: Not on file    Minutes per session: Not on file  . Stress: Not on file  Relationships  . Social connections:    Talks on phone: Not on file    Gets together: Not on file    Attends religious service: Not on  file    Active member of club or organization: Not on file    Attends meetings of clubs or organizations: Not on file    Relationship status: Not on file  . Intimate partner violence:    Fear of current or ex partner: Not on file    Emotionally abused: Not on file    Physically abused: Not on file    Forced sexual activity: Not on file  Other Topics Concern  . Not on file  Social History Narrative  . Not on file    Outpatient Encounter Medications as of 11/21/2018  Medication Sig  . ACCU-CHEK FASTCLIX LANCETS MISC 1 each by Does not apply route as directed. Check sugar 6 x daily  . Continuous Blood Gluc Receiver (FREESTYLE LIBRE 14 DAY READER) DEVI 1 each by Does not apply route  continuous.  . Continuous Blood Gluc Sensor (FREESTYLE LIBRE 14 DAY SENSOR) MISC 1 each by Does not apply route every 14 (fourteen) days.  Marland Kitchen glucose blood test strip Use as instructed  . glucose monitoring kit (FREESTYLE) monitoring kit 1 each by Does not apply route as needed for other.  . Semaglutide,0.25 or 0.'5MG'$ /DOS, (OZEMPIC, 0.25 OR 0.5 MG/DOSE,) 2 MG/1.5ML SOPN Inject 0.25 mg into the skin once a week for 28 days, THEN 0.5 mg once a week for 28 days.  Marland Kitchen terbinafine (LAMISIL) 250 MG tablet TK 1 T PO QD  . cholestyramine light (PREVALITE) 4 g packet Take 1 packet (4 g total) by mouth 2 (two) times daily.  . cyclobenzaprine (FLEXERIL) 10 MG tablet Take 10 mg by mouth.  . DULoxetine (CYMBALTA) 30 MG capsule Take 2 capsules (60 mg total) by mouth daily. Take 1 tablet once a day for 7 days and then increase to 2 tablets daily.  . enalapril (VASOTEC) 2.5 MG tablet Take 1 tablet (2.5 mg total) by mouth daily. (Patient not taking: Reported on 11/21/2018)  . ibuprofen (ADVIL,MOTRIN) 800 MG tablet   . metFORMIN (GLUCOPHAGE-XR) 750 MG 24 hr tablet Take 1 tablet (750 mg total) by mouth daily with breakfast. (Patient not taking: Reported on 11/21/2018)  . omeprazole (PRILOSEC) 20 MG capsule Take 1 capsule (20 mg total) by mouth daily.  . Red Yeast Rice 600 MG CAPS Take 4 capsules (2,400 mg total) by mouth daily. (Patient not taking: Reported on 11/21/2018)  . [DISCONTINUED] clindamycin (CLEOCIN) 300 MG capsule Take 300 mg by mouth 2 (two) times daily. 1 tablet bid x 5 days   No facility-administered encounter medications on file as of 11/21/2018.     Allergies  Allergen Reactions  . Penicillins Anaphylaxis and Hives  . Amoxicillin Itching    Review of Systems  Constitutional: Positive for fatigue. Negative for appetite change, chills, fever and unexpected weight change.  HENT: Negative for sore throat.   Eyes: Negative for photophobia and visual disturbance.  Respiratory: Negative for cough,  shortness of breath and wheezing.   Cardiovascular: Negative for chest pain, palpitations and leg swelling.  Gastrointestinal: Positive for abdominal pain, diarrhea and nausea. Negative for abdominal distention, anal bleeding, blood in stool, constipation, rectal pain and vomiting.  Endocrine: Negative for polydipsia, polyphagia and polyuria.  Genitourinary: Negative for decreased urine volume and difficulty urinating.  Neurological: Positive for numbness (bilateral flanks). Negative for dizziness, weakness, light-headedness and headaches.  Psychiatric/Behavioral: Positive for dysphoric mood and sleep disturbance. Negative for agitation, behavioral problems, confusion, decreased concentration, hallucinations, self-injury and suicidal ideas. The patient is not nervous/anxious and is not hyperactive.  All other systems reviewed and are negative.       Objective:    BP 123/87   Pulse (!) 126   Temp 97.7 F (36.5 C) (Oral)   Ht '5\' 11"'$  (1.803 m)   Wt 169 lb (76.7 kg)   BMI 23.57 kg/m    Wt Readings from Last 3 Encounters:  11/21/18 169 lb (76.7 kg)  10/17/18 174 lb 6 oz (79.1 kg)  09/15/17 206 lb (93.4 kg)    Physical Exam  Constitutional: He appears well-developed and well-nourished. He is cooperative. No distress.  HENT:  Head: Normocephalic and atraumatic.  Mouth/Throat: Oropharynx is clear and moist and mucous membranes are normal.  Eyes: Pupils are equal, round, and reactive to light. Conjunctivae and lids are normal.  Neck: Trachea normal and phonation normal.  Cardiovascular: Normal rate, regular rhythm, normal heart sounds and intact distal pulses. Exam reveals no gallop and no friction rub.  No murmur heard. Pulmonary/Chest: Effort normal and breath sounds normal. No respiratory distress.  Abdominal: Soft. Normal appearance and bowel sounds are normal. He exhibits no shifting dullness, no distension, no pulsatile liver, no fluid wave, no abdominal bruit, no ascites, no  pulsatile midline mass and no mass. There is no hepatosplenomegaly. There is no tenderness. There is no rigidity, no rebound, no guarding, no CVA tenderness, no tenderness at McBurney's point and negative Murphy's sign. No hernia.  Feet:  Right Foot:  Skin Integrity: Positive for callus (great toe).  Left Foot:  Skin Integrity: Positive for callus (great toe).  Neurological: He is alert. No sensory deficit.  Skin: Skin is warm and dry. Capillary refill takes less than 2 seconds.  Psychiatric: His speech is normal and behavior is normal. Judgment and thought content normal. Cognition and memory are normal. He exhibits a depressed mood.  Nursing note and vitals reviewed.   Results for orders placed or performed in visit on 10/25/18  HM DIABETES EYE EXAM  Result Value Ref Range   HM Diabetic Eye Exam No Retinopathy No Retinopathy     A1C 11.1 in office today.   Pertinent labs & imaging results that were available during my care of the patient were reviewed by me and considered in my medical decision making.  Assessment & Plan:  Jaice was seen today for diabetes and abdominal pain, nausea, diarrhea.  Diagnoses and all orders for this visit:  Type 2 diabetes mellitus with microalbuminuria, without long-term current use of insulin (HCC) A1C 11.1 today. Pt currently not taking Metformin. Pt will start taking Metformin as prescribed. Diet and exercise encouraged. Limit intake of sugary foods and beverages. Return in 3 months for reevaluation.  -     Bayer DCA Hb A1c Waived  Irritable bowel syndrome with diarrhea No red flag symptoms. Will trial cholestyramine. Return if symptoms worsen or fail to improve. Diet for IBS discussed.  -     cholestyramine light (PREVALITE) 4 g packet; Take 1 packet (4 g total) by mouth 2 (two) times daily.  Diabetic mononeuropathy associated with type 2 diabetes mellitus ([place]) Pt also complains of depressed mood and fatigue. Will trial Cymbalta for dual  benefits.  -     DULoxetine (CYMBALTA) 30 MG capsule; Take 2 capsules (60 mg total) by mouth daily. Take 1 tablet once a day for 7 days and then increase to 2 tablets daily.  Gastroesophageal reflux disease without esophagitis Avoid spicy, greasy foods. Do not eat for at least 45 minutes prior to bed.  -  omeprazole (PRILOSEC) 20 MG capsule; Take 1 capsule (20 mg total) by mouth daily.   Depression, major, single episode, mild (HCC)  Will trial Cymbalta for dual benefits.    Continue all other maintenance medications.  Follow up plan: Return in about 3 months (around 02/21/2019), or if symptoms worsen or fail to improve.  Educational handout given for IBS, neuropathy, GERD  The above assessment and management plan was discussed with the patient. The patient verbalized understanding of and has agreed to the management plan. Patient is aware to call the clinic if symptoms persist or worsen. Patient is aware when to return to the clinic for a follow-up visit. Patient educated on when it is appropriate to go to the emergency department.   Monia Pouch, FNP-C [facility] [phone]

## 2018-11-21 NOTE — Patient Instructions (Signed)
Focal Neuropathy  Focal neuropathy is a nerve injury that affects one area of the body, such as an arm, a leg, or the face. The injury may involve one nerve or a small group of nerves. Examples of focal neuropathy include brachial plexus injury and Parsonage-Turner syndrome.  What are the causes?  This condition may be caused by:   A sudden cut or stretch. This can happen because of an accident or during surgery.   A small injury that happens again and again.   A compression injury. This kind of injury happens when a blood vessel, a tumor, or something else presses on a nerve for a long time.   Entrapment. This can happen to a nerve that has to pass through a narrow area.   Lack of blood to the nerves. This can be caused by certain medical conditions, like diabetes or vasculitis.   Extreme cold.   Severe burns.   Radiation.    What are the signs or symptoms?  Symptoms of this condition depend on where the damaged nerve is and what kind of nerve it is. For example, damage to nerves that carry signals away from the brain can cause symptoms related to movement, but damage to nerves that carry signals to the brain can cause symptoms related to feeling and pain.  Symptoms affect only one area of the body. Common symptoms include:   Numbness.   Tingling.   A burning pain.   A prickling feeling.   Very sensitive skin.   Weakness.   Paralysis.   Muscle twitching.   Muscles getting smaller (muscle wasting).   Poor coordination.    How is this diagnosed?  This condition may be diagnosed with:   A neurologic exam. During the exam, your health care provider will check your reflexes, how you move, and what you can feel.   Tests. Tests may be done to help find the area that has been damaged. Tests may include:  ? Imaging tests, such as a CT scan or MRI.  ? Electromyogram (EMG). This test checks the nerves that control your muscles.  ? Nerve conduction velocity (NCV) tests. These tests check how quickly  messages pass through your nerves.    How is this treated?  Treatment for this condition depends on what damaged the nerve and how much of the nerve is damaged. Treatment may involve:   Surgery to ease pressure on a nerve. This may be done if you have a compression injury or entrapment.   Medicines, such as:  ? Medicines for pain, such painkillers, certain anti-seizure medicines, or antidepressants.  ? Steroid medicines. These may be given in pill form or with a shot (injection).   Physical therapy (PT) to help movement.   Splints or other devices to help movement.    Follow these instructions at home:     Take over-the-counter and prescription medicines only as told by your health care provider.   If you were given a splint or other device to help with movement, use it as told by your health care provider.   If any part of your body is numb, check it every day for signs of injury or infection. Watch for:  ? Redness, swelling, or pain.  ? Fluid or blood.  ? Warmth.  ? Pus or a bad smell.   Do not do things that put pressure on your damaged nerve. You may have to avoid certain activities or avoid sitting or lying in a certain way.     with focal neuropathy can be stressful. Talk with a mental health caregiver or join a support group if you are struggling.  Keep all follow-up visits as told by your health care provider. This is important. These include PT visits. Contact a health care provider if:  You think you have an injury or infection.  You have new symptoms.  You are struggling emotionally from dealing with your condition. Get help right away if:  You have severe pain that comes on  suddenly.  You develop any paralysis.  You lose feeling in any part of your body. This information is not intended to replace advice given to you by your health care provider. Make sure you discuss any questions you have with your health care provider. Document Released: 11/24/2015 Document Revised: 05/20/2016 Document Reviewed: 07/11/2015 Elsevier Interactive Patient Education  2018 [employer] Diet for Irritable Bowel Syndrome When you have irritable bowel syndrome (IBS), the foods you eat and your eating habits are very important. IBS may cause various symptoms, such as abdominal pain, constipation, or diarrhea. Choosing the right foods can help ease discomfort caused by these symptoms. Work with your health care provider and dietitian to find the best eating plan to help control your symptoms. What general guidelines do I need to follow?  Keep a food diary. This will help you identify foods that cause symptoms. Write down: ? What you eat and when. ? What symptoms you have. ? When symptoms occur in relation to your meals.  Avoid foods that cause symptoms. Talk with your dietitian about other ways to get the same nutrients that are in these foods.  Eat more foods that contain fiber. Take a fiber supplement if directed by your dietitian.  Eat your meals slowly, in a relaxed setting.  Aim to eat 5-6 small meals per day. Do not skip meals.  Drink enough fluids to keep your urine clear or pale yellow.  Ask your health care provider if you should take an over-the-counter probiotic during flare-ups to help restore healthy gut bacteria.  If you have cramping or diarrhea, try making your meals low in fat and high in carbohydrates. Examples of carbohydrates are pasta, rice, whole grain breads and cereals, fruits, and vegetables.  If dairy products cause your symptoms to flare up, try eating less of them. You might be able to handle yogurt better than other dairy products because it  contains bacteria that help with digestion. What foods are not recommended? The following are some foods and drinks that may worsen your symptoms:  Fatty foods, such as Jamaica[place] fries.  Milk products, such as cheese or ice cream.  Chocolate.  Alcohol.  Products with caffeine, such as coffee.  Carbonated drinks, such as soda.  The items listed above may not be a complete list of foods and beverages to avoid. Contact your dietitian for more information. What foods are good sources of fiber? Your health care provider or dietitian may recommend that you eat more foods that contain fiber. Fiber can help reduce constipation and other IBS symptoms. Add foods with fiber to your diet a little at a time so that your body can get used to them. Too much fiber at once might cause gas and swelling of your abdomen. The following are some foods that are good sources of fiber:  Apples.  Peaches.  Pears.  Berries.  Figs.  Broccoli (raw).  Cabbage.  Carrots.  Raw peas.  Kidney beans.  Lima beans.  Whole grain bread.  Whole  grain cereal.  Where to find more information: [employer] for Functional Gastrointestinal Disorders: www.iffgd.[employer] of Diabetes and Digestive and Kidney Diseases: http://norris-lawson.com/.aspx This information is not intended to replace advice given to you by your health care provider. Make sure you discuss any questions you have with your health care provider. Document Released: 03/04/2004 Document Revised: 05/20/2016 Document Reviewed: 03/15/2014 Elsevier Interactive Patient Education  2018 Elsevier Inc. Gastroesophageal Reflux Disease, Adult Normally, food travels down the esophagus and stays in the stomach to be digested. If a person has gastroesophageal reflux disease (GERD), food and stomach acid move back up into the esophagus. When this happens, the esophagus  becomes sore and swollen (inflamed). Over time, GERD can make small holes (ulcers) in the lining of the esophagus. Follow these instructions at home: Diet  Follow a diet as told by your doctor. You may need to avoid foods and drinks such as: ? Coffee and tea (with or without caffeine). ? Drinks that contain alcohol. ? Energy drinks and sports drinks. ? Carbonated drinks or sodas. ? Chocolate and cocoa. ? Peppermint and mint flavorings. ? Garlic and onions. ? Horseradish. ? Spicy and acidic foods, such as peppers, chili powder, curry powder, vinegar, hot sauces, and BBQ sauce. ? Citrus fruit juices and citrus fruits, such as oranges, lemons, and limes. ? Tomato-based foods, such as red sauce, chili, salsa, and pizza with red sauce. ? Fried and fatty foods, such as donuts, french fries, potato chips, and high-fat dressings. ? High-fat meats, such as hot dogs, rib eye steak, sausage, ham, and bacon. ? High-fat dairy items, such as whole milk, butter, and cream cheese.  Eat small meals often. Avoid eating large meals.  Avoid drinking large amounts of liquid with your meals.  Avoid eating meals during the 2-3 hours before bedtime.  Avoid lying down right after you eat.  Do not exercise right after you eat. General instructions  Pay attention to any changes in your symptoms.  Take over-the-counter and prescription medicines only as told by your doctor. Do not take aspirin, ibuprofen, or other NSAIDs unless your doctor says it is okay.  Do not use any tobacco products, including cigarettes, chewing tobacco, and e-cigarettes. If you need help quitting, ask your doctor.  Wear loose clothes. Do not wear anything tight around your waist.  Raise (elevate) the head of your bed about 6 inches (15 cm).  Try to lower your stress. If you need help doing this, ask your doctor.  If you are overweight, lose an amount of weight that is healthy for you. Ask your doctor about a safe weight loss  goal.  Keep all follow-up visits as told by your doctor. This is important. Contact a doctor if:  You have new symptoms.  You lose weight and you do not know why it is happening.  You have trouble swallowing, or it hurts to swallow.  You have wheezing or a cough that keeps happening.  Your symptoms do not get better with treatment.  You have a hoarse voice. Get help right away if:  You have pain in your arms, neck, jaw, teeth, or back.  You feel sweaty, dizzy, or light-headed.  You have chest pain or shortness of breath.  You throw up (vomit) and your throw up looks like blood or coffee grounds.  You pass out (faint).  Your poop (stool) is bloody or black.  You cannot swallow, drink, or eat. This information is not intended to replace advice given to you  by your health care provider. Make sure you discuss any questions you have with your health care provider. Document Released: 05/31/2008 Document Revised: 05/20/2016 Document Reviewed: 04/09/2015 Elsevier Interactive Patient Education  [employer].

## 2018-11-28 ENCOUNTER — Encounter: Payer: Self-pay | Admitting: Family

## 2018-11-28 ENCOUNTER — Telehealth: Payer: Self-pay | Admitting: Family Medicine

## 2018-11-28 ENCOUNTER — Ambulatory Visit: Payer: BLUE CROSS/BLUE SHIELD | Admitting: Family

## 2018-11-28 VITALS — Temp 96.9°F | Ht 71.0 in | Wt 169.0 lb

## 2018-11-28 DIAGNOSIS — E1165 Type 2 diabetes mellitus with hyperglycemia: Secondary | ICD-10-CM

## 2018-11-28 DIAGNOSIS — R000 Tachycardia, unspecified: Secondary | ICD-10-CM

## 2018-11-28 DIAGNOSIS — I951 Orthostatic hypotension: Secondary | ICD-10-CM

## 2018-11-28 DIAGNOSIS — R42 Dizziness and giddiness: Secondary | ICD-10-CM

## 2018-11-28 DIAGNOSIS — H65191 Other acute nonsuppurative otitis media, right ear: Secondary | ICD-10-CM

## 2018-11-28 LAB — GLUCOSE HEMOCUE WAIVED: Glu Hemocue Waived: 159 mg/dL — ABNORMAL HIGH (ref 65–99)

## 2018-11-28 MED ORDER — MECLIZINE HCL 12.5 MG PO TABS: 12.5000 mg | ORAL_TABLET | Freq: Three times a day (TID) | ORAL | 0 refills | Status: DC | PRN

## 2018-11-28 MED ORDER — CLINDAMYCIN HCL 300 MG PO CAPS: 300.0000 mg | ORAL_CAPSULE | Freq: Three times a day (TID) | ORAL | 0 refills | Status: DC

## 2018-11-28 NOTE — Telephone Encounter (Signed)
[facility]

## 2018-11-28 NOTE — Progress Notes (Signed)
Subjective:    Patient ID: Shawn Harmon, male    DOB: 02-08-1996, 22 y.o.   MRN: [id number]  Chief Complaint  Patient presents with  . Dizziness    notices is when he gets up too quickly. Also has blurred vision with these episodes. Symptoms started a few months ago with worsening recently. States that he knows it was before he started Cymbalta. Reports blood sugar was 166 at noon today.    Dizziness  This is a new problem. The current episode started more than 1 month ago. The problem occurs intermittently. The problem has been waxing and waning (every time he stands). Associated symptoms include chills, fatigue and nausea. Pertinent negatives include no chest pain, congestion, coughing, fever, headaches, urinary symptoms or vomiting. Exacerbated by: standing. He has tried lying down and rest for the symptoms. The treatment provided mild relief.      Review of Systems  Constitutional: Positive for chills and fatigue. Negative for fever.  HENT: Negative for congestion.   Respiratory: Negative for cough.   Cardiovascular: Negative for chest pain.  Gastrointestinal: Positive for nausea. Negative for vomiting.  Neurological: Positive for dizziness. Negative for headaches.  All other systems reviewed and are negative.      Objective:   Physical Exam  Constitutional: He is oriented to person, place, and time. He appears well-developed and well-nourished. No distress.  HENT:  Head: Normocephalic.  Right Ear: External ear normal. Tympanic membrane is erythematous. A middle ear effusion is present.  Left Ear: External ear normal.  Mouth/Throat: Oropharynx is clear and moist.  Eyes: Pupils are equal, round, and reactive to light. Right eye exhibits no discharge. Left eye exhibits no discharge.  Neck: Normal range of motion. Neck supple. No thyromegaly present.  Cardiovascular: Normal rate, regular rhythm, normal heart sounds and intact distal pulses.  No murmur  heard. Pulmonary/Chest: Effort normal and breath sounds normal. No respiratory distress. He has no wheezes.  Abdominal: Soft. Bowel sounds are normal. He exhibits no distension. There is no tenderness.  Musculoskeletal: Normal range of motion. He exhibits no edema or tenderness.  Neurological: He is alert and oriented to person, place, and time. He has normal reflexes. No cranial nerve deficit.  Skin: Skin is warm and dry. No rash noted. No erythema.  Psychiatric: He has a normal mood and affect. His behavior is normal. Judgment and thought content normal.  Vitals reviewed.   Temp (!) 96.9 F (36.1 C) (Oral)   Ht 5\' 11"  (1.803 m)   Wt 169 lb (76.7 kg)   BMI 23.57 kg/m      Assessment & Plan:  Shawn Harmon comes in today with chief complaint of Dizziness (notices is when he gets up too quickly. Also has blurred vision with these episodes. Symptoms started a few months ago with worsening recently. States that he knows it was before he started Cymbalta. Reports blood sugar was 166 at noon today. )   Diagnosis and orders addressed:  1. Dizziness With dizziness and tachycardia we will do Cardiologists referral - Glucose Hemocue Waived - EKG 12-Lead - Ambulatory referral to Cardiology - meclizine (ANTIVERT) 12.5 MG tablet; Take 1 tablet (12.5 mg total) by mouth 3 (three) times daily as needed for dizziness.  Dispense: 30 tablet; Refill: 0  2. Uncontrolled type 2 diabetes mellitus with hyperglycemia (HCC) We will do referral to Endo with uncontrolled DM - Ambulatory referral to Endocrinology  3. Tachycardia -Could be dehydration, but will do referral to Cardiologists with  the dizzinesss - Ambulatory referral to Cardiology  4. Orthostatic hypotension  5. Other non-recurrent acute nonsuppurative otitis media of right ear - Take meds as prescribed - Use a cool mist humidifier  -Use saline nose sprays frequently -Force fluids -For any cough or congestion  Use plain Mucinex-  regular strength or max strength is fine -For fever or aces or pains- take tylenol or ibuprofen. -RTO if symptoms worsen or do not improve  - clindamycin (CLEOCIN) 300 MG capsule; Take 1 capsule (300 mg total) by mouth 3 (three) times daily.  Dispense: 21 capsule; Refill: 0    Jannifer Rodneyhristy Sherby Moncayo, FNP

## 2018-11-28 NOTE — Patient Instructions (Signed)
Dizziness °Dizziness is a common problem. It is a feeling of unsteadiness or light-headedness. You may feel like you are about to faint. Dizziness can lead to injury if you stumble or fall. Anyone can become dizzy, but dizziness is more common in older adults. This condition can be caused by a number of things, including medicines, dehydration, or illness. °Follow these instructions at home: °Eating and drinking °· Drink enough fluid to keep your urine clear or pale yellow. This helps to keep you from becoming dehydrated. Try to drink more clear fluids, such as water. °· Do not drink alcohol. °· Limit your caffeine intake if told to do so by your health care provider. Check ingredients and nutrition facts to see if a food or beverage contains caffeine. °· Limit your salt (sodium) intake if told to do so by your health care provider. Check ingredients and nutrition facts to see if a food or beverage contains sodium. °Activity °· Avoid making quick movements. °? Rise slowly from chairs and steady yourself until you feel okay. °? In the morning, first sit up on the side of the bed. When you feel okay, stand slowly while you hold onto something until you know that your balance is fine. °· If you need to stand in one place for a long time, move your legs often. Tighten and relax the muscles in your legs while you are standing. °· Do not drive or use heavy machinery if you feel dizzy. °· Avoid bending down if you feel dizzy. Place items in your home so that they are easy for you to reach without leaning over. °Lifestyle °· Do not use any products that contain nicotine or tobacco, such as cigarettes and e-cigarettes. If you need help quitting, ask your health care provider. °· Try to reduce your stress level by using methods such as yoga or meditation. Talk with your health care provider if you need help to manage your stress. °General instructions °· Watch your dizziness for any changes. °· Take over-the-counter and  prescription medicines only as told by your health care provider. Talk with your health care provider if you think that your dizziness is caused by a medicine that you are taking. °· Tell a friend or a family member that you are feeling dizzy. If he or she notices any changes in your behavior, have this person call your health care provider. °· Keep all follow-up visits as told by your health care provider. This is important. °Contact a health care provider if: °· Your dizziness does not go away. °· Your dizziness or light-headedness gets worse. °· You feel nauseous. °· You have reduced hearing. °· You have new symptoms. °· You are unsteady on your feet or you feel like the room is spinning. °Get help right away if: °· You vomit or have diarrhea and are unable to eat or drink anything. °· You have problems talking, walking, swallowing, or using your arms, hands, or legs. °· You feel generally weak. °· You are not thinking clearly or you have trouble forming sentences. It may take a friend or family member to notice this. °· You have chest pain, abdominal pain, shortness of breath, or sweating. °· Your vision changes. °· You have any bleeding. °· You have a severe headache. °· You have neck pain or a stiff neck. °· You have a fever. °These symptoms may represent a serious problem that is an emergency. Do not wait to see if the symptoms will go away. Get medical help   right away. Call your local emergency services (911 in the [place] Do not drive yourself to the hospital. °Summary °· Dizziness is a feeling of unsteadiness or light-headedness. This condition can be caused by a number of things, including medicines, dehydration, or illness. °· Anyone can become dizzy, but dizziness is more common in older adults. °· Drink enough fluid to keep your urine clear or pale yellow. Do not drink alcohol. °· Avoid making quick movements if you feel dizzy. Monitor your dizziness for any changes. °This information is not intended to  replace advice given to you by your health care provider. Make sure you discuss any questions you have with your health care provider. °Document Released: 06/08/2001 Document Revised: 01/15/2017 Document Reviewed: 01/15/2017 °Elsevier Interactive Patient Education © 2018 [employer] ° °

## 2018-11-28 NOTE — Telephone Encounter (Signed)
Spoke to pt and he states when he gets up from a seated position he gets really dizzy and blurred vision that lasts about 30 seconds and then gets better. Pt states his blood sugars avg 150, BP readings 94/72 now. Pt given appt today at 6:15 and will have father drive him since he is dizzy.

## 2018-12-08 ENCOUNTER — Other Ambulatory Visit: Payer: Self-pay | Admitting: Family Medicine

## 2018-12-08 DIAGNOSIS — E1129 Type 2 diabetes mellitus with other diabetic kidney complication: Secondary | ICD-10-CM

## 2018-12-08 DIAGNOSIS — R809 Proteinuria, unspecified: Principal | ICD-10-CM

## 2018-12-22 ENCOUNTER — Ambulatory Visit: Payer: Self-pay | Admitting: Internal Medicine

## 2018-12-22 DIAGNOSIS — Z0289 Encounter for other administrative examinations: Secondary | ICD-10-CM

## 2018-12-22 NOTE — Progress Notes (Deleted)
Patient ID: Delorise RoyalsCaleb M Cousins, male   DOB: [birth year], 22 y.o.   MRN: [id number]   HPI: Delorise RoyalsCaleb M Pho is a 22 y.o.-year-old male, referred by his PCP, Rakes, Doralee AlbinoLinda M, FNP, for management of DM2, dx in 2014, non-insulin-dependent, uncontrolled, with long-term complications (PN, microalbuminuria).  Reviewed latest HbA1c level: Lab Results  Component Value Date   HGBA1C 11.1 (H) 11/21/2018   HGBA1C 13.6 (H) 10/17/2018   HGBA1C 15.5 (H) 01/07/2016   HGBA1C 7.2 04/10/2013   HGBA1C 11.0 01/26/2013   HGBA1C 11.7 (H) 01/08/2013   Pt is on a regimen of: - Metformin ER 750 mg in am  - Ozempic 0.5 mg weekly  Pt checks his sugars *** a day and they are: - am: n/c - 2h after b'fast: n/c - before lunch: n/c - 2h after lunch: n/c - before dinner: n/c - 2h after dinner: n/c - bedtime: n/c - nighttime: n/c Lowest sugar was ***; he has hypoglycemia awareness at 70.  Highest sugar was ***.  Glucometer:***  Pt's meals are: - Breakfast: - Lunch: - Dinner: - Snacks:  - no CKD, last BUN/creatinine:  Lab Results  Component Value Date   BUN 12 10/17/2018   BUN <5 (L) 01/14/2016   CREATININE 0.83 10/17/2018   CREATININE 0.58 (L) 01/14/2016  Not taking Enalapril as Rx'ed.  - + Mixed HL; last set of lipids: Lab Results  Component Value Date   CHOL 198 10/17/2018   HDL 33 (L) 10/17/2018   LDLCALC 114 (H) 10/17/2018   TRIG 257 (H) 10/17/2018   CHOLHDL 6.0 (H) 10/17/2018  Not taking Red Yeast Rice, Colesthyramine as Rx'ed.  - last eye exam was in ***. No DR.   - + numbness and tingling in his feet. On Cymbalta.  She has a h/o Fournier gangrene.   Pt has FH of DM in ***.  ROS: Constitutional: no weight gain, no weight loss, no fatigue, no subjective hyperthermia, no subjective hypothermia, no nocturia Eyes: no blurry vision, no xerophthalmia ENT: no sore throat, no nodules palpated in neck, no dysphagia, no odynophagia, no hoarseness, no tinnitus, no hypoacusis Cardiovascular: no CP,  no SOB, no palpitations, no leg swelling Respiratory: no cough, no SOB, no wheezing Gastrointestinal: no N, no V, no D, no C, no acid reflux Musculoskeletal: no muscle, no joint aches Skin: no rash, no hair loss Neurological: no tremors, no numbness or tingling/no dizziness/no HAs Psychiatric: no depression, no anxiety  PE: There were no vitals taken for this visit. Wt Readings from Last 3 Encounters:  11/28/18 169 lb (76.7 kg)  11/21/18 169 lb (76.7 kg)  10/17/18 174 lb 6 oz (79.1 kg)   Constitutional: normal weight, in NAD Eyes: PERRLA, EOMI, no exophthalmos ENT: moist mucous membranes, no thyromegaly, no cervical lymphadenopathy Cardiovascular: RRR, No MRG Respiratory: CTA B Gastrointestinal: abdomen soft, NT, ND, BS+ Musculoskeletal: no deformities, strength intact in all 4 Skin: moist, warm, no rashes Neurological: no tremor with outstretched hands, DTR normal in all 4  ASSESSMENT: 1. DM2, non-insulin-dependent, uncontrolled, with long-term complications - PN - microalbuminuria  PLAN:  1. Patient with long-standing, uncontrolled diabetes, on oral antidiabetic regimen, which became insufficient. - I suggested to:  There are no Patient Instructions on file for this visit. - Strongly advised him to start checking sugars at different times of the day - check 1x a day, rotating checks - discussed about CBG targets for treatment: 80-130 mg/dL before meals and <409<180 mg/dL after meals; target [id number] <7%. - given sugar  log and advised how to fill it and to bring it at next appt  - given foot care handout and explained the principles  - given instructions for hypoglycemia management "15-15 rule"  - advised for yearly eye exams  - Return to clinic in 3 mo with sugar log   Carlus Pavlovristina Cleland Simkins, MD PhD [facility]

## 2018-12-26 DIAGNOSIS — R05 Cough: Secondary | ICD-10-CM | POA: Diagnosis not present

## 2018-12-26 DIAGNOSIS — J019 Acute sinusitis, unspecified: Secondary | ICD-10-CM | POA: Diagnosis not present

## 2018-12-26 DIAGNOSIS — H9203 Otalgia, bilateral: Secondary | ICD-10-CM | POA: Diagnosis not present

## 2019-01-16 ENCOUNTER — Encounter: Payer: Self-pay | Admitting: Cardiology

## 2019-01-16 NOTE — Progress Notes (Deleted)
Cardiology Office Note  Date: 01/16/2019   ID: Behr, Cislo July 15, 1996, MRN [id number]  PCP: Baruch Gouty, FNP  Consulting Cardiologist: Rozann Lesches, MD   No chief complaint on file.   History of Present Illness: Shawn Harmon is a 23 y.o. male referred for cardiology consultation by Ms. Hawks NP for the evaluation of dizziness.  Past Medical History:  Diagnosis Date  . Asthma   . Cataract 2017  . Diabetes mellitus ([place])   . Seasonal allergies    seasonal    Past Surgical History:  Procedure Laterality Date  . CATARACT EXTRACTION    . INCISION AND DRAINAGE PERIRECTAL ABSCESS N/A 01/07/2016   Procedure: INCISION AND DRAINAGE OF PERINEUM;  Surgeon: Ralene Ok, MD;  Location: [facility];  Service: General;  Laterality: N/A;  . IRRIGATION AND DEBRIDEMENT ABSCESS Bilateral 01/08/2016   Procedure: IRRIGATION AND DEBRIDEMENT GROIN;  Surgeon: Ralene Ok, MD;  Location: [facility];  Service: General;  Laterality: Bilateral;    Current Outpatient Medications  Medication Sig Dispense Refill  . ACCU-CHEK FASTCLIX LANCETS MISC 1 each by Does not apply route as directed. Check sugar 6 x daily 204 each 3  . cholestyramine light (PREVALITE) 4 g packet Take 1 packet (4 g total) by mouth 2 (two) times daily. 60 packet 3  . clindamycin (CLEOCIN) 300 MG capsule Take 1 capsule (300 mg total) by mouth 3 (three) times daily. 21 capsule 0  . Continuous Blood Gluc Receiver (FREESTYLE LIBRE 14 DAY READER) DEVI 1 each by Does not apply route continuous. 1 Device 0  . Continuous Blood Gluc Sensor (FREESTYLE LIBRE 14 DAY SENSOR) MISC 1 each by Does not apply route every 14 (fourteen) days. 2 each 3  . DULoxetine (CYMBALTA) 30 MG capsule Take 2 capsules (60 mg total) by mouth daily. Take 1 tablet once a day for 7 days and then increase to 2 tablets daily. 60 capsule 3  . enalapril (VASOTEC) 2.5 MG tablet Take 1 tablet (2.5 mg total) by mouth daily. (Patient not taking: Reported on  11/21/2018) 30 tablet 3  . glucose blood test strip Use as instructed 100 each 12  . glucose monitoring kit (FREESTYLE) monitoring kit 1 each by Does not apply route as needed for other. 1 each 0  . ibuprofen (ADVIL,MOTRIN) 800 MG tablet     . meclizine (ANTIVERT) 12.5 MG tablet Take 1 tablet (12.5 mg total) by mouth 3 (three) times daily as needed for dizziness. 30 tablet 0  . metFORMIN (GLUCOPHAGE-XR) 750 MG 24 hr tablet Take 1 tablet (750 mg total) by mouth daily with breakfast. (Patient not taking: Reported on 11/21/2018) 30 tablet 3  . omeprazole (PRILOSEC) 20 MG capsule Take 1 capsule (20 mg total) by mouth daily. 30 capsule 3  . Red Yeast Rice 600 MG CAPS Take 4 capsules (2,400 mg total) by mouth daily. (Patient not taking: Reported on 11/21/2018) 120 capsule 3  . terbinafine (LAMISIL) 250 MG tablet TK 1 T PO QD  0   No current facility-administered medications for this visit.    Allergies:  Penicillins and Amoxicillin   Social History: The patient  reports that he has never smoked. He has never used smokeless tobacco. He reports that he does not drink alcohol or use drugs.   Family History: The patient's family history includes Arthritis in his father; Asthma in his father; Diabetes in his father, mother, paternal grandfather, and sister; Heart disease in his father, mother, and paternal grandfather;  Hypertension in his father and mother; Kidney disease in his father.   ROS:  Please see the history of present illness. Otherwise, complete review of systems is positive for {NONE DEFAULTED:18576::"none"}.  All other systems are reviewed and negative.   Physical Exam: VS:  There were no vitals taken for this visit., BMI There is no height or weight on file to calculate BMI.  Wt Readings from Last 3 Encounters:  11/28/18 169 lb (76.7 kg)  11/21/18 169 lb (76.7 kg)  10/17/18 174 lb 6 oz (79.1 kg)    General: Patient appears comfortable at rest. HEENT: Conjunctiva and lids normal,  oropharynx clear with moist mucosa. Neck: Supple, no elevated JVP or carotid bruits, no thyromegaly. Lungs: Clear to auscultation, nonlabored breathing at rest. Cardiac: Regular rate and rhythm, no S3 or significant systolic murmur, no pericardial rub. Abdomen: Soft, nontender, no hepatomegaly, bowel sounds present, no guarding or rebound. Extremities: No pitting edema, distal pulses 2+. Skin: Warm and dry. Musculoskeletal: No kyphosis. Neuropsychiatric: Alert and oriented x3, affect grossly appropriate.  ECG: I personally reviewed the tracing from 11/28/2018 which showed sinus tachycardia with rightward axis.  Recent Labwork: 10/17/2018: ALT 24; AST 9; BUN 12; Creatinine, Ser 0.83; Hemoglobin 16.0; Platelets 233; Potassium 4.6; Sodium 142; TSH 1.450     Component Value Date/Time   CHOL 198 10/17/2018 1420   TRIG 257 (H) 10/17/2018 1420   HDL 33 (L) 10/17/2018 1420   CHOLHDL 6.0 (H) 10/17/2018 1420   LDLCALC 114 (H) 10/17/2018 1420    Other Studies Reviewed Today:  Chest x-ray 01/09/2016: FINDINGS: Support apparatus stable in satisfactorily position.  The lungs appear clear.  Cardiac and mediastinal contours normal.  No pleural effusion identified.  IMPRESSION: 1. The lungs appear clear. Support apparatus satisfactorily position.  Assessment and Plan:   Current medicines were reviewed with the patient today.  No orders of the defined types were placed in this encounter.   Disposition:  Signed, Satira Sark, MD, [facility] 01/16/2019 7:03 PM    [place] [facility] [address] [place], [place] [address] Phone: [phone]; Fax: [phone]

## 2019-01-17 ENCOUNTER — Ambulatory Visit: Payer: Self-pay | Admitting: Cardiology

## 2019-01-18 ENCOUNTER — Encounter: Payer: Self-pay | Admitting: Cardiology

## 2019-01-29 ENCOUNTER — Ambulatory Visit: Payer: BLUE CROSS/BLUE SHIELD | Admitting: "Endocrinology

## 2019-02-08 DIAGNOSIS — R42 Dizziness and giddiness: Secondary | ICD-10-CM | POA: Diagnosis not present

## 2019-02-08 DIAGNOSIS — R197 Diarrhea, unspecified: Secondary | ICD-10-CM | POA: Diagnosis not present

## 2019-02-08 DIAGNOSIS — R112 Nausea with vomiting, unspecified: Secondary | ICD-10-CM | POA: Diagnosis not present

## 2019-02-08 DIAGNOSIS — E1165 Type 2 diabetes mellitus with hyperglycemia: Secondary | ICD-10-CM | POA: Diagnosis not present

## 2019-02-14 ENCOUNTER — Encounter: Payer: Self-pay | Admitting: Internal Medicine

## 2019-02-22 ENCOUNTER — Ambulatory Visit: Payer: BLUE CROSS/BLUE SHIELD | Admitting: Family Medicine

## 2019-02-27 ENCOUNTER — Other Ambulatory Visit: Payer: Self-pay | Admitting: Gastroenterology

## 2019-02-27 ENCOUNTER — Other Ambulatory Visit (HOSPITAL_COMMUNITY): Payer: Self-pay | Admitting: Gastroenterology

## 2019-02-27 DIAGNOSIS — R197 Diarrhea, unspecified: Secondary | ICD-10-CM | POA: Diagnosis not present

## 2019-02-27 DIAGNOSIS — R112 Nausea with vomiting, unspecified: Secondary | ICD-10-CM | POA: Diagnosis not present

## 2019-03-07 ENCOUNTER — Ambulatory Visit (HOSPITAL_COMMUNITY)
Admission: RE | Admit: 2019-03-07 | Discharge: 2019-03-07 | Disposition: A | Discharge status: Home / Self Care | Payer: BLUE CROSS/BLUE SHIELD | Source: Ambulatory Visit | Attending: Gastroenterology | Admitting: Gastroenterology

## 2019-03-07 DIAGNOSIS — R112 Nausea with vomiting, unspecified: Secondary | ICD-10-CM | POA: Diagnosis not present

## 2019-03-07 DIAGNOSIS — K30 Functional dyspepsia: Secondary | ICD-10-CM | POA: Diagnosis not present

## 2019-03-07 MED ORDER — TECHNETIUM TC 99M SULFUR COLLOID
2.1000 | Freq: Once | INTRAVENOUS | Status: AC | PRN
  Administered 2019-03-07: 2.1 via ORAL

## 2019-03-14 ENCOUNTER — Encounter: Payer: Self-pay | Admitting: Endocrinology

## 2019-03-14 ENCOUNTER — Other Ambulatory Visit: Payer: Self-pay

## 2019-03-14 ENCOUNTER — Ambulatory Visit (INDEPENDENT_AMBULATORY_CARE_PROVIDER_SITE_OTHER): Payer: BLUE CROSS/BLUE SHIELD | Admitting: Endocrinology

## 2019-03-14 VITALS — BP 110/80 | HR 119 | Ht 71.0 in | Wt 173.4 lb

## 2019-03-14 DIAGNOSIS — R809 Proteinuria, unspecified: Secondary | ICD-10-CM | POA: Diagnosis not present

## 2019-03-14 DIAGNOSIS — E1129 Type 2 diabetes mellitus with other diabetic kidney complication: Secondary | ICD-10-CM | POA: Diagnosis not present

## 2019-03-14 LAB — POCT GLYCOSYLATED HEMOGLOBIN (HGB A1C): Hemoglobin A1C: 7.6 % — AB (ref 4.0–5.6)

## 2019-03-14 MED ORDER — REPAGLINIDE 0.5 MG PO TABS: 0.5000 mg | ORAL_TABLET | Freq: Three times a day (TID) | ORAL | 11 refills | Status: DC

## 2019-03-14 NOTE — Patient Instructions (Addendum)
good diet and exercise significantly improve the control of your diabetes.  please let me know if you wish to be referred to a dietician.  high blood sugar is very risky to your health.  you should see an eye doctor and dentist every year.  It is very important to get all recommended vaccinations.  Controlling your blood pressure and cholesterol drastically reduces the damage diabetes does to your body.  Those who smoke should quit.  Please discuss these with your doctor.  check your blood sugar once a day.  vary the time of day when you check, between before the 3 meals, and at bedtime.  also check if you have symptoms of your blood sugar being too high or too low.  please keep a record of the readings and bring it to your next appointment here (or you can bring the meter itself).  You can write it on any piece of paper.  please call us sooner if your blood sugar goes below 70, or if you have a lot of readings over 200.  Your fast heart rate could be related to neuropathy, but you should have this checked out by Dr Kateri Plummer or someone he refers you to.  Due to the gastroparesis, we should not increase the Ozempic, at least for now.  I have sent a prescription to your pharmacy, to add "repaglinide."   With time, you will need to resume insulin.  The best way to delay this is to keep you body weight and blood sugar down. Please come back for a follow-up appointment in 2 months.

## 2019-03-14 NOTE — Progress Notes (Signed)
Subjective:    Patient ID: Shawn Harmon, male    DOB: 1996-05-16, 23 y.o.   MRN: [id number]  HPI pt is referred by Gilford Silvius, NP, for diabetes.  Pt states DM was dx'ed in 2014, when he presented with nonketotic hyperosmolar hyperglycemic state; he weighed 260 lbs at then time of dx; he has mild neuropathy of the lower extremities, and associated gastroparesis; he took insulin 2014-2016; pt says his diet and exercise are fair; he has never had pancreatitis, pancreatic surgery, severe hypoglycemia.   Past Medical History:  Diagnosis Date  . Asthma   . Cataract 2017  . Diabetes mellitus (HCC)   . Seasonal allergies    seasonal    Past Surgical History:  Procedure Laterality Date  . CATARACT EXTRACTION    . INCISION AND DRAINAGE PERIRECTAL ABSCESS N/A 01/07/2016   Procedure: INCISION AND DRAINAGE OF PERINEUM;  Surgeon: Axel Filler, MD;  Location: [facility];  Service: General;  Laterality: N/A;  . IRRIGATION AND DEBRIDEMENT ABSCESS Bilateral 01/08/2016   Procedure: IRRIGATION AND DEBRIDEMENT GROIN;  Surgeon: Axel Filler, MD;  Location: [facility] OR;  Service: General;  Laterality: Bilateral;    Social History   Socioeconomic History  . Marital status: Single    Spouse name: Not on file  . Number of children: Not on file  . Years of education: Not on file  . Highest education level: Not on file  Occupational History  . Not on file  Social Needs  . Financial resource strain: Not on file  . Food insecurity:    Worry: Not on file    Inability: Not on file  . Transportation needs:    Medical: Not on file    Non-medical: Not on file  Tobacco Use  . Smoking status: Never Smoker  . Smokeless tobacco: Never Used  Substance and Sexual Activity  . Alcohol use: No  . Drug use: No  . Sexual activity: Never  Lifestyle  . Physical activity:    Days per week: Not on file    Minutes per session: Not on file  . Stress: Not on file  Relationships  . Social connections:    Talks on  phone: Not on file    Gets together: Not on file    Attends religious service: Not on file    Active member of club or organization: Not on file    Attends meetings of clubs or organizations: Not on file    Relationship status: Not on file  . Intimate partner violence:    Fear of current or ex partner: Not on file    Emotionally abused: Not on file    Physically abused: Not on file    Forced sexual activity: Not on file  Other Topics Concern  . Not on file  Social History Narrative  . Not on file    Current Outpatient Medications on File Prior to Visit  Medication Sig Dispense Refill  . ACCU-CHEK FASTCLIX LANCETS MISC 1 each by Does not apply route as directed. Check sugar 6 x daily 204 each 3  . cholestyramine light (PREVALITE) 4 g packet Take 1 packet (4 g total) by mouth 2 (two) times daily. 60 packet 3  . DULoxetine (CYMBALTA) 30 MG capsule Take 2 capsules (60 mg total) by mouth daily. Take 1 tablet once a day for 7 days and then increase to 2 tablets daily. 60 capsule 3  . glucose blood test strip Use as instructed 100 each 12  .  ibuprofen (ADVIL,MOTRIN) 800 MG tablet     . meclizine (ANTIVERT) 12.5 MG tablet Take 1 tablet (12.5 mg total) by mouth 3 (three) times daily as needed for dizziness. 30 tablet 0  . omeprazole (PRILOSEC) 20 MG capsule Take 1 capsule (20 mg total) by mouth daily. 30 capsule 3  . Semaglutide, 1 MG/DOSE, (OZEMPIC, 1 MG/DOSE,) 2 MG/1.5ML SOPN Inject 0.5 mg into the skin.    Marland Kitchen terbinafine (LAMISIL) 250 MG tablet TK 1 T PO QD  0  . enalapril (VASOTEC) 2.5 MG tablet Take 1 tablet (2.5 mg total) by mouth daily. (Patient not taking: Reported on 11/21/2018) 30 tablet 3   No current facility-administered medications on file prior to visit.     Allergies  Allergen Reactions  . Penicillins Anaphylaxis and Hives  . Amoxicillin Itching    Family History  Problem Relation Age of Onset  . Diabetes Mother   . Hypertension Mother   . Heart disease Mother   .  Diabetes Father   . Arthritis Father   . Asthma Father   . Hypertension Father   . Heart disease Father   . Kidney disease Father   . Diabetes Sister   . Heart disease Paternal Grandfather   . Diabetes Paternal Grandfather     BP 110/80 (BP Location: Right Arm, Patient Position: Sitting, Cuff Size: Normal)   Pulse (!) 119   Ht  (1.803 m)   Wt 173 lb 6.4 oz (78.7 kg)   SpO2 97%   BMI 24.18 kg/m     Review of Systems denies blurry vision, headache, chest pain, sob, urinary frequency, muscle cramps, excessive diaphoresis, difficulty with concentration, depression, cold intolerance, and easy bruising.  He has regained a few lbs.  He has chronic intermitt nausea and rhinorrhea.    Objective:   Physical Exam VS: see vs page GEN: no distress HEAD: head: no deformity eyes: no periorbital swelling, no proptosis external nose and ears are normal mouth: no lesion seen NECK: supple, thyroid is not enlarged CHEST WALL: no deformity LUNGS: clear to auscultation CV: reg rate and rhythm, no murmur ABD: abdomen is soft, nontender.  no hepatosplenomegaly.  not distended.  no hernia MUSCULOSKELETAL: muscle bulk and strength are grossly normal.  no obvious joint swelling.  gait is normal and steady EXTEMITIES: no deformity.  no ulcer on the feet.  feet are of normal color and temp.  no leg edema.  There is bilateral onychomycosis of the toenails PULSES: dorsalis pedis intact bilat.  no carotid bruit NEURO:  cn 2-12 grossly intact.   readily moves all 4's.  sensation is intact to touch on the feet, but decreased from normal.  SKIN:  Normal texture and temperature.  No rash or suspicious lesion is visible.   NODES:  None palpable at the neck PSYCH: alert, well-oriented.  Does not appear anxious nor depressed.  Lab Results  Component Value Date   HGBA1C 7.6 (A) 03/14/2019   Lab Results  Component Value Date   TSH 1.450 10/17/2018   I have reviewed outside records, and summarized:  Pt was noted to have elevated a1c, and referred here.  In 2017, he was hospitalized with Fournier's Gangrene and DKA.      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 Weight loss, new to me: could be due to severe hyperglycemia or gastroparesis.    Patient  Instructions  good diet and exercise significantly improve the control of your diabetes.  please let me know if you wish to be referred to a dietician.  high blood sugar is very risky to your health.  you should see an eye doctor and dentist every year.  It is very important to get all recommended vaccinations.  Controlling your blood pressure and cholesterol drastically reduces the damage diabetes does to your body.  Those who smoke should quit.  Please discuss these with your doctor.  check your blood sugar once a day.  vary the time of day when you check, between before the 3 meals, and at bedtime.  also check if you have symptoms of your blood sugar being too high or too low.  please keep a record of the readings and bring it to your next appointment here (or you can bring the meter itself).  You can write it on any piece of paper.  please call us sooner if your blood sugar goes below 70, or if you have a lot of readings over 200.  Your fast heart rate could be related to neuropathy, but you should have this checked out by Dr Kateri Plummer or someone he refers you to.  Due to the gastroparesis, we should not increase the Ozempic, at least for now.  I have sent a prescription to your pharmacy, to add "repaglinide."   With time, you will need to resume insulin.  The best way to delay this is to keep you body weight and blood sugar down. Please come back for a follow-up appointment in 2 months.

## 2019-03-15 DIAGNOSIS — M549 Dorsalgia, unspecified: Secondary | ICD-10-CM | POA: Diagnosis not present

## 2019-03-15 DIAGNOSIS — E1165 Type 2 diabetes mellitus with hyperglycemia: Secondary | ICD-10-CM | POA: Diagnosis not present

## 2019-04-02 DIAGNOSIS — E119 Type 2 diabetes mellitus without complications: Secondary | ICD-10-CM | POA: Diagnosis not present

## 2019-04-02 DIAGNOSIS — M546 Pain in thoracic spine: Secondary | ICD-10-CM | POA: Diagnosis not present

## 2019-04-02 DIAGNOSIS — G8929 Other chronic pain: Secondary | ICD-10-CM | POA: Diagnosis not present

## 2019-04-02 DIAGNOSIS — M419 Scoliosis, unspecified: Secondary | ICD-10-CM | POA: Diagnosis not present

## 2019-04-09 DIAGNOSIS — M546 Pain in thoracic spine: Secondary | ICD-10-CM | POA: Diagnosis not present

## 2019-04-16 DIAGNOSIS — M546 Pain in thoracic spine: Secondary | ICD-10-CM | POA: Diagnosis not present

## 2019-04-23 DIAGNOSIS — M546 Pain in thoracic spine: Secondary | ICD-10-CM | POA: Diagnosis not present

## 2019-04-26 DIAGNOSIS — R197 Diarrhea, unspecified: Secondary | ICD-10-CM | POA: Diagnosis not present

## 2019-04-26 DIAGNOSIS — R112 Nausea with vomiting, unspecified: Secondary | ICD-10-CM | POA: Diagnosis not present

## 2019-04-30 DIAGNOSIS — M546 Pain in thoracic spine: Secondary | ICD-10-CM | POA: Diagnosis not present

## 2019-04-30 DIAGNOSIS — G8929 Other chronic pain: Secondary | ICD-10-CM | POA: Diagnosis not present

## 2019-05-11 ENCOUNTER — Encounter: Payer: Self-pay | Admitting: Endocrinology

## 2019-05-14 ENCOUNTER — Encounter: Payer: BLUE CROSS/BLUE SHIELD | Admitting: Endocrinology

## 2019-05-14 ENCOUNTER — Other Ambulatory Visit: Payer: Self-pay

## 2019-07-30 ENCOUNTER — Ambulatory Visit: Payer: BLUE CROSS/BLUE SHIELD | Admitting: "Endocrinology

## 2019-08-18 DIAGNOSIS — K0889 Other specified disorders of teeth and supporting structures: Secondary | ICD-10-CM | POA: Diagnosis not present

## 2019-11-28 DIAGNOSIS — S61217A Laceration without foreign body of left little finger without damage to nail, initial encounter: Secondary | ICD-10-CM | POA: Diagnosis not present

## 2019-11-28 DIAGNOSIS — Y999 Unspecified external cause status: Secondary | ICD-10-CM | POA: Diagnosis not present

## 2019-11-28 DIAGNOSIS — W228XXA Striking against or struck by other objects, initial encounter: Secondary | ICD-10-CM | POA: Diagnosis not present

## 2020-01-24 DIAGNOSIS — E1165 Type 2 diabetes mellitus with hyperglycemia: Secondary | ICD-10-CM | POA: Diagnosis not present

## 2020-01-24 DIAGNOSIS — N529 Male erectile dysfunction, unspecified: Secondary | ICD-10-CM | POA: Diagnosis not present

## 2020-01-24 DIAGNOSIS — R197 Diarrhea, unspecified: Secondary | ICD-10-CM | POA: Diagnosis not present

## 2020-01-28 DIAGNOSIS — M9902 Segmental and somatic dysfunction of thoracic region: Secondary | ICD-10-CM | POA: Diagnosis not present

## 2020-01-28 DIAGNOSIS — M9903 Segmental and somatic dysfunction of lumbar region: Secondary | ICD-10-CM | POA: Diagnosis not present

## 2020-01-28 DIAGNOSIS — M545 Low back pain: Secondary | ICD-10-CM | POA: Diagnosis not present

## 2020-01-28 DIAGNOSIS — M546 Pain in thoracic spine: Secondary | ICD-10-CM | POA: Diagnosis not present

## 2020-01-31 DIAGNOSIS — M546 Pain in thoracic spine: Secondary | ICD-10-CM | POA: Diagnosis not present

## 2020-01-31 DIAGNOSIS — M9902 Segmental and somatic dysfunction of thoracic region: Secondary | ICD-10-CM | POA: Diagnosis not present

## 2020-01-31 DIAGNOSIS — M545 Low back pain: Secondary | ICD-10-CM | POA: Diagnosis not present

## 2020-01-31 DIAGNOSIS — M9903 Segmental and somatic dysfunction of lumbar region: Secondary | ICD-10-CM | POA: Diagnosis not present

## 2020-02-05 DIAGNOSIS — R195 Other fecal abnormalities: Secondary | ICD-10-CM | POA: Diagnosis not present

## 2020-02-07 DIAGNOSIS — M9903 Segmental and somatic dysfunction of lumbar region: Secondary | ICD-10-CM | POA: Diagnosis not present

## 2020-02-07 DIAGNOSIS — M9902 Segmental and somatic dysfunction of thoracic region: Secondary | ICD-10-CM | POA: Diagnosis not present

## 2020-02-07 DIAGNOSIS — M545 Low back pain: Secondary | ICD-10-CM | POA: Diagnosis not present

## 2020-02-07 DIAGNOSIS — M546 Pain in thoracic spine: Secondary | ICD-10-CM | POA: Diagnosis not present

## 2020-02-11 DIAGNOSIS — M9903 Segmental and somatic dysfunction of lumbar region: Secondary | ICD-10-CM | POA: Diagnosis not present

## 2020-02-11 DIAGNOSIS — M9902 Segmental and somatic dysfunction of thoracic region: Secondary | ICD-10-CM | POA: Diagnosis not present

## 2020-02-11 DIAGNOSIS — M546 Pain in thoracic spine: Secondary | ICD-10-CM | POA: Diagnosis not present

## 2020-02-11 DIAGNOSIS — M545 Low back pain: Secondary | ICD-10-CM | POA: Diagnosis not present

## 2020-02-21 DIAGNOSIS — M545 Low back pain: Secondary | ICD-10-CM | POA: Diagnosis not present

## 2020-02-21 DIAGNOSIS — M546 Pain in thoracic spine: Secondary | ICD-10-CM | POA: Diagnosis not present

## 2020-02-21 DIAGNOSIS — M9902 Segmental and somatic dysfunction of thoracic region: Secondary | ICD-10-CM | POA: Diagnosis not present

## 2020-02-21 DIAGNOSIS — M9903 Segmental and somatic dysfunction of lumbar region: Secondary | ICD-10-CM | POA: Diagnosis not present

## 2020-02-25 DIAGNOSIS — M9903 Segmental and somatic dysfunction of lumbar region: Secondary | ICD-10-CM | POA: Diagnosis not present

## 2020-02-25 DIAGNOSIS — M9902 Segmental and somatic dysfunction of thoracic region: Secondary | ICD-10-CM | POA: Diagnosis not present

## 2020-02-25 DIAGNOSIS — M546 Pain in thoracic spine: Secondary | ICD-10-CM | POA: Diagnosis not present

## 2020-02-25 DIAGNOSIS — M545 Low back pain: Secondary | ICD-10-CM | POA: Diagnosis not present

## 2020-02-28 DIAGNOSIS — M9902 Segmental and somatic dysfunction of thoracic region: Secondary | ICD-10-CM | POA: Diagnosis not present

## 2020-02-28 DIAGNOSIS — M9903 Segmental and somatic dysfunction of lumbar region: Secondary | ICD-10-CM | POA: Diagnosis not present

## 2020-02-28 DIAGNOSIS — M546 Pain in thoracic spine: Secondary | ICD-10-CM | POA: Diagnosis not present

## 2020-02-28 DIAGNOSIS — M545 Low back pain: Secondary | ICD-10-CM | POA: Diagnosis not present

## 2020-02-29 DIAGNOSIS — N528 Other male erectile dysfunction: Secondary | ICD-10-CM | POA: Diagnosis not present

## 2020-02-29 DIAGNOSIS — N5313 Anejaculatory orgasm: Secondary | ICD-10-CM | POA: Diagnosis not present

## 2020-03-19 DIAGNOSIS — N528 Other male erectile dysfunction: Secondary | ICD-10-CM | POA: Diagnosis not present

## 2020-03-19 DIAGNOSIS — N5313 Anejaculatory orgasm: Secondary | ICD-10-CM | POA: Diagnosis not present

## 2020-03-28 ENCOUNTER — Ambulatory Visit (HOSPITAL_COMMUNITY)
Admission: EM | Admit: 2020-03-28 | Discharge: 2020-03-28 | Disposition: A | Discharge status: Home / Self Care | Payer: BC Managed Care – PPO | Attending: Family Medicine | Admitting: Family Medicine

## 2020-03-28 ENCOUNTER — Other Ambulatory Visit: Payer: Self-pay

## 2020-03-28 ENCOUNTER — Encounter (HOSPITAL_COMMUNITY): Payer: Self-pay

## 2020-03-28 DIAGNOSIS — T148XXA Other injury of unspecified body region, initial encounter: Secondary | ICD-10-CM | POA: Diagnosis not present

## 2020-03-28 MED ORDER — IBUPROFEN 800 MG PO TABS: 800.0000 mg | ORAL_TABLET | Freq: Three times a day (TID) | ORAL | 0 refills | Status: DC

## 2020-03-28 MED ORDER — TETANUS-DIPHTH-ACELL PERTUSSIS 5-2.5-18.5 LF-MCG/0.5 IM SUSP
INTRAMUSCULAR | Status: AC
  Filled 2020-03-28: qty 0.5

## 2020-03-28 MED ORDER — TETANUS-DIPHTH-ACELL PERTUSSIS 5-2.5-18.5 LF-MCG/0.5 IM SUSP
0.5000 mL | Freq: Once | INTRAMUSCULAR | Status: AC
  Administered 2020-03-28: 18:00:00 0.5 mL via INTRAMUSCULAR

## 2020-03-28 MED ORDER — DOXYCYCLINE HYCLATE 100 MG PO CAPS: 100.0000 mg | ORAL_CAPSULE | Freq: Two times a day (BID) | ORAL | 0 refills | Status: AC

## 2020-03-28 NOTE — ED Provider Notes (Addendum)
[facility]    CSN: [id number] Arrival date & time: 03/28/20  1653      History   Chief Complaint Chief Complaint  Patient presents with  . Foot Injury    HPI Shawn Harmon is a 24 y.o. male history of DM type II, presenting today for evaluation of puncture wound.  Patient notes that yesterday he stepped on a nail while renovating his home.  Nail went into his left foot near the ball of his foot by his fourth toe.  He has had a lot of pain with weightbearing.  Denies any drainage.  Reports last A1c 15.  Unsure of last tetanus.  Denies concern for fracture, feels puncture wound relatively superficial.  HPI  Past Medical History:  Diagnosis Date  . Asthma   . Cataract 2017  . Diabetes mellitus ([place])   . Seasonal allergies    seasonal    Patient Active Problem List   Diagnosis Date Noted  . Microalbuminuria due to type 2 diabetes mellitus ([place]) 11/21/2018  . Hyperlipidemia associated with type 2 diabetes mellitus ([place]) 11/21/2018  . Gastroesophageal reflux disease without esophagitis 11/21/2018  . Diabetic mononeuropathy associated with type 2 diabetes mellitus ([place]) 11/21/2018  . Irritable bowel syndrome with diarrhea 11/21/2018  . Depression, major, single episode, mild ([place]) 11/21/2018  . Vitiligo 10/17/2018  . BMI 24.0-24.9, adult 10/17/2018  . Fournier's gangrene 01/08/2016  . Type 2 diabetes mellitus ([place]) 01/26/2013  . Acanthosis nigricans, acquired 01/26/2013  . Uncontrolled diabetes mellitus ([place]) 01/09/2013  . Hyperglycemia 01/09/2013  . Obesity 01/09/2013    Past Surgical History:  Procedure Laterality Date  . CATARACT EXTRACTION    . INCISION AND DRAINAGE PERIRECTAL ABSCESS N/A 01/07/2016   Procedure: INCISION AND DRAINAGE OF PERINEUM;  Surgeon: Ralene Ok, MD;  Location: [facility];  Service: General;  Laterality: N/A;  . IRRIGATION AND DEBRIDEMENT ABSCESS Bilateral 01/08/2016   Procedure: IRRIGATION AND DEBRIDEMENT GROIN;  Surgeon: Ralene Ok, MD;  Location: [facility];  Service: General;  Laterality: Bilateral;       Home Medications    Prior to Admission medications   Medication Sig Start Date End Date Taking? Authorizing Provider  Blood Glucose Monitoring Suppl (ACCU-CHEK AVIVA PLUS) w/Device KIT AS DIRECTED METER AND CONTROLS 30 DAYS 01/20/16  Yes [provider]  clomiPHENE (CLOMID) 50 MG tablet Take by mouth. 03/20/20 06/18/20 Yes [provider]  glucose blood (ACCU-CHEK AVIVA PLUS) test strip CHECK GLUCOSE 3 TIMES A DAY FOR UNCONTROLED GLUCOSE E10.65 01/20/16  Yes [provider]  sildenafil (REVATIO) 20 MG tablet Take 1-3 tablets 1 hour prior to intercourse on an empty stomach. 02/29/20  Yes [provider]  [place] 1 each by Does not apply route as directed. Check sugar 6 x daily 10/17/18   Baruch Gouty, FNP  cholestyramine light (PREVALITE) 4 g packet Take 1 packet (4 g total) by mouth 2 (two) times daily. 11/21/18 03/21/19  Baruch Gouty, FNP  doxycycline (VIBRAMYCIN) 100 MG capsule Take 1 capsule (100 mg total) by mouth 2 (two) times daily for 7 days. 03/28/20 04/04/20  Monterrius Cardosa C, PA-C  DULoxetine (CYMBALTA) 30 MG capsule Take 2 capsules (60 mg total) by mouth daily. Take 1 tablet once a day for 7 days and then increase to 2 tablets daily. 11/21/18 03/21/19  Baruch Gouty, FNP  enalapril (VASOTEC) 2.5 MG tablet Take 1 tablet (2.5 mg total) by mouth daily. Patient not taking: Reported on 11/21/2018  10/19/18 02/16/19  Baruch Gouty, FNP  glucose blood test strip Use as instructed 10/17/18   Baruch Gouty, FNP  ibuprofen (ADVIL) 800 MG tablet Take 1 tablet (800 mg total) by mouth 3 (three) times daily. 03/28/20   Araiyah Cumpton C, PA-C  insulin lispro (HUMALOG) 100 UNIT/ML injection Inject into the skin.    [provider]  meclizine (ANTIVERT) 12.5 MG tablet Take 1 tablet (12.5 mg total) by mouth 3 (three) times daily as needed for dizziness. 11/28/18    Sharion Balloon, FNP  omeprazole (PRILOSEC) 20 MG capsule Take 1 capsule (20 mg total) by mouth daily. 11/21/18   Baruch Gouty, FNP  repaglinide (PRANDIN) 0.5 MG tablet Take 1 tablet (0.5 mg total) by mouth 3 (three) times daily before meals. 03/14/19   Renato Shin, MD  Semaglutide, 1 MG/DOSE, (OZEMPIC, 1 MG/DOSE,) 2 MG/1.5ML SOPN Inject 0.5 mg into the skin.    [provider]  Semaglutide,0.25 or 0.'5MG'$ /DOS, (OZEMPIC, 0.25 OR 0.5 MG/DOSE,) 2 MG/1.5ML SOPN Ozempic 0.25 mg or 0.5 mg (2 mg/1.5 mL) subcutaneous pen injector    [provider]  terbinafine (LAMISIL) 250 MG tablet TK 1 T PO QD 11/14/18   [provider]    Family History Family History  Problem Relation Age of Onset  . Diabetes Mother   . Hypertension Mother   . Heart disease Mother   . Diabetes Father   . Arthritis Father   . Asthma Father   . Hypertension Father   . Heart disease Father   . Kidney disease Father   . Diabetes Sister   . Heart disease Paternal Grandfather   . Diabetes Paternal Grandfather     Social History Social History   Tobacco Use  . Smoking status: Never Smoker  . Smokeless tobacco: Never Used  Substance Use Topics  . Alcohol use: Yes  . Drug use: No     Allergies   Penicillins, Amoxicillin, No known allergies, and Penicillin g   Review of Systems Review of Systems  Constitutional: Negative for fatigue and fever.  Eyes: Negative for redness, itching and visual disturbance.  Respiratory: Negative for shortness of breath.   Cardiovascular: Negative for chest pain and leg swelling.  Gastrointestinal: Negative for nausea and vomiting.  Musculoskeletal: Positive for arthralgias and gait problem. Negative for myalgias.  Skin: Positive for wound. Negative for color change and rash.  Neurological: Negative for dizziness, syncope, weakness, light-headedness and headaches.     Physical Exam Triage Vital Signs ED Triage Vitals  Enc Vitals Group     BP  03/28/20 1711 111/73     Pulse Rate 03/28/20 1711 (!) 117     Resp 03/28/20 1711 18     Temp 03/28/20 1711 98.1 F (36.7 C)     Temp Source 03/28/20 1711 Oral     SpO2 03/28/20 1711 96 %     Weight 03/28/20 1716 209 lb 6.4 oz (95 kg)     Height --      Head Circumference --      Peak Flow --      Pain Score 03/28/20 1716 0     Pain Loc --      Pain Edu? --      Excl. in [place]? --    No data found.  Updated Vital Signs BP 111/73 (BP Location: Left Arm)   Pulse (!) 117   Temp 98.1 F (36.7 C) (Oral)   Resp 18   Wt 209  lb 6.4 oz (95 kg)   SpO2 96%   BMI 29.21 kg/m   Visual Acuity Right Eye Distance:   Left Eye Distance:   Bilateral Distance:    Right Eye Near:   Left Eye Near:    Bilateral Near:     Physical Exam Vitals and nursing note reviewed.  Constitutional:      Appearance: He is well-developed.     Comments: No acute distress  HENT:     Head: Normocephalic and atraumatic.     Nose: Nose normal.  Eyes:     Conjunctiva/sclera: Conjunctivae normal.  Cardiovascular:     Rate and Rhythm: Tachycardia present.  Pulmonary:     Effort: Pulmonary effort is normal. No respiratory distress.  Abdominal:     General: There is no distension.  Musculoskeletal:        General: Normal range of motion.     Cervical back: Neck supple.     Comments: Dorsalis pedis 2+  Skin:    General: Skin is warm and dry.     Comments: Puncture wound noted to plantar surface of left foot near the ball of foot at base of fourth toe, no surrounding erythema, no significant surrounding tenderness, tender to palpation at wound  Neurological:     Mental Status: He is alert and oriented to person, place, and time.      UC Treatments / Results  Labs (all labs ordered are listed, but only abnormal results are displayed) Labs Reviewed - No data to display  EKG   Radiology No results found.  Procedures Procedures (including critical care time)  Medications Ordered in  UC Medications  Tdap (BOOSTRIX) injection 0.5 mL (has no administration in time range)    Initial Impression / Assessment and Plan / UC Course  I have reviewed the triage vital signs and the nursing notes.  Pertinent labs & imaging results that were available during my care of the patient were reviewed by me and considered in my medical decision making (see chart for details).     Updating tetanus today, given history of diabetes with significantly elevated A1c will go ahead and prophylactically placed on antibiotics.  Has allergies to penicillins of anaphylaxis, deferring cephalosporins.  Placing on doxycycline x1 week.  Tylenol and ibuprofen for pain.  Ice and elevate.  Patient denies concern for fracture, deferring imaging.  Discussed strict return precautions. Patient verbalized understanding and is agreeable with plan.  Final Clinical Impressions(s) / UC Diagnoses   Final diagnoses:  Puncture wound     Discharge Instructions     We updated your tetanus today Please keep wound clean and dry, wash with warm soapy water twice daily, dry well after foot being wet Begin doxycycline twice daily for the next week. Ice and elevate foot Tylenol and ibuprofen for pain and swelling Please follow-up with wound not healing/pain improving with time   ED Prescriptions    Medication Sig Dispense Auth. Provider   ibuprofen (ADVIL) 800 MG tablet Take 1 tablet (800 mg total) by mouth 3 (three) times daily. 21 tablet Delonda Coley C, PA-C   doxycycline (VIBRAMYCIN) 100 MG capsule Take 1 capsule (100 mg total) by mouth 2 (two) times daily for 7 days. 14 capsule Dalal Livengood, [place] C, PA-C     PDMP not reviewed this encounter.   Joneen Caraway, [place] C, PA-C 03/28/20 1734    Janith Lima, PA-C 03/28/20 1735

## 2020-03-28 NOTE — ED Triage Notes (Addendum)
Pt is here with left foot pain after stepping on a nail yesterday. Pt has not taken any meds to relieve discomfort.

## 2020-03-28 NOTE — Discharge Instructions (Signed)
We updated your tetanus today Please keep wound clean and dry, wash with warm soapy water twice daily, dry well after foot being wet Begin doxycycline twice daily for the next week. Ice and elevate foot Tylenol and ibuprofen for pain and swelling Please follow-up with wound not healing/pain improving with time

## 2020-04-02 ENCOUNTER — Other Ambulatory Visit: Payer: Self-pay

## 2020-04-02 ENCOUNTER — Ambulatory Visit (INDEPENDENT_AMBULATORY_CARE_PROVIDER_SITE_OTHER): Payer: BC Managed Care – PPO | Admitting: Endocrinology

## 2020-04-02 ENCOUNTER — Encounter: Payer: Self-pay | Admitting: Endocrinology

## 2020-04-02 VITALS — BP 110/80 | HR 97 | Ht 71.0 in | Wt 208.0 lb

## 2020-04-02 DIAGNOSIS — R809 Proteinuria, unspecified: Secondary | ICD-10-CM | POA: Diagnosis not present

## 2020-04-02 DIAGNOSIS — E1129 Type 2 diabetes mellitus with other diabetic kidney complication: Secondary | ICD-10-CM | POA: Diagnosis not present

## 2020-04-02 LAB — POCT GLYCOSYLATED HEMOGLOBIN (HGB A1C): Hemoglobin A1C: 14 % — AB (ref 4.0–5.6)

## 2020-04-02 MED ORDER — NOVOLOG FLEXPEN 100 UNIT/ML ~~LOC~~ SOPN: 3.0000 [IU] | PEN_INJECTOR | Freq: Three times a day (TID) | SUBCUTANEOUS | 11 refills | Status: DC

## 2020-04-02 MED ORDER — BASAGLAR KWIKPEN 100 UNIT/ML ~~LOC~~ SOPN: 10.0000 [IU] | PEN_INJECTOR | Freq: Every day | SUBCUTANEOUS | 11 refills | Status: DC

## 2020-04-02 MED ORDER — ACCU-CHEK AVIVA PLUS VI STRP: 1.0000 | ORAL_STRIP | Freq: Four times a day (QID) | 3 refills | Status: DC

## 2020-04-02 NOTE — Progress Notes (Signed)
 Subjective:    Patient ID: Shawn Harmon, male    DOB: 02/16/1996, 24 y.o.   MRN: [id number]  HPI Pt returns for f/u of diabetes mellitus: DM type: Atypical (intermittently ketosis-prone) Dx'ed: 2014 Complications: PN and GP Therapy: no medication now DKA: once, in 2017 Severe hypoglycemia: never Pancreatitis: never Pancreatic imaging: somewhat diminutive in 2017 CT SDOH: he has insurance just intermittently Other: he took insulin 2014-2016; he weighed 260 lbs at then time of dx Interval history: Pt says cbg's are approx 400. He has not recently taken medication for DM.  pt states he feels well in general.   Past Medical History:  Diagnosis Date  . Asthma   . Cataract 2017  . Diabetes mellitus (HCC)   . Seasonal allergies    seasonal    Past Surgical History:  Procedure Laterality Date  . CATARACT EXTRACTION    . INCISION AND DRAINAGE PERIRECTAL ABSCESS N/A 01/07/2016   Procedure: INCISION AND DRAINAGE OF PERINEUM;  Surgeon: Armando Ramirez, MD;  Location: [facility];  Service: General;  Laterality: N/A;  . IRRIGATION AND DEBRIDEMENT ABSCESS Bilateral 01/08/2016   Procedure: IRRIGATION AND DEBRIDEMENT GROIN;  Surgeon: Armando Ramirez, MD;  Location: [facility];  Service: General;  Laterality: Bilateral;    Social History   Socioeconomic History  . Marital status: Single    Spouse name: Not on file  . Number of children: Not on file  . Years of education: Not on file  . Highest education level: Not on file  Occupational History  . Not on file  Tobacco Use  . Smoking status: Never Smoker  . Smokeless tobacco: Never Used  Substance and Sexual Activity  . Alcohol use: Yes  . Drug use: No  . Sexual activity: Not Currently  Other Topics Concern  . Not on file  Social History Narrative  . Not on file   Social Determinants of Health   Financial Resource Strain:   . Difficulty of Paying Living Expenses:   Food Insecurity:   . Worried About Running Out of Food in the Last  Year:   . Ran Out of Food in the Last Year:   Transportation Needs:   . Lack of Transportation (Medical):   . Lack of Transportation (Non-Medical):   Physical Activity:   . Days of Exercise per Week:   . Minutes of Exercise per Session:   Stress:   . Feeling of Stress :   Social Connections:   . Frequency of Communication with Friends and Family:   . Frequency of Social Gatherings with Friends and Family:   . Attends Religious Services:   . Active Member of Clubs or Organizations:   . Attends Club or Organization Meetings:   . Marital Status:   Intimate Partner Violence:   . Fear of Current or Ex-Partner:   . Emotionally Abused:   . Physically Abused:   . Sexually Abused:     Current Outpatient Medications on File Prior to Visit  Medication Sig Dispense Refill  . ACCU-CHEK FASTCLIX LANCETS MISC 1 each by Does not apply route as directed. Check sugar 6 x daily 204 each 3  . Blood Glucose Monitoring Suppl (ACCU-CHEK AVIVA PLUS) w/Device KIT AS DIRECTED METER AND CONTROLS 30 DAYS    . clomiPHENE (CLOMID) 50 MG tablet Take by mouth.    . ibuprofen (ADVIL) 800 MG tablet Take 1 tablet (800 mg total) by mouth 3 (three) times daily. 21 tablet 0  . meclizine (ANTIVERT) 12.5 MG tablet   Take 1 tablet (12.5 mg total) by mouth 3 (three) times daily as needed for dizziness. 30 tablet 0  . omeprazole (PRILOSEC) 20 MG capsule Take 1 capsule (20 mg total) by mouth daily. 30 capsule 3  . sildenafil (REVATIO) 20 MG tablet Take 1-3 tablets 1 hour prior to intercourse on an empty stomach.    . terbinafine (LAMISIL) 250 MG tablet TK 1 T PO QD  0  . cholestyramine light (PREVALITE) 4 g packet Take 1 packet (4 g total) by mouth 2 (two) times daily. 60 packet 3  . DULoxetine (CYMBALTA) 30 MG capsule Take 2 capsules (60 mg total) by mouth daily. Take 1 tablet once a day for 7 days and then increase to 2 tablets daily. 60 capsule 3  . enalapril (VASOTEC) 2.5 MG tablet Take 1 tablet (2.5 mg total) by mouth  daily. (Patient not taking: Reported on 11/21/2018) 30 tablet 3   No current facility-administered medications on file prior to visit.    Allergies  Allergen Reactions  . Penicillins Anaphylaxis and Hives  . Amoxicillin Itching  . No Known Allergies   . Penicillin G Itching    Family History  Problem Relation Age of Onset  . Diabetes Mother   . Hypertension Mother   . Heart disease Mother   . Diabetes Father   . Arthritis Father   . Asthma Father   . Hypertension Father   . Heart disease Father   . Kidney disease Father   . Diabetes Sister   . Heart disease Paternal Grandfather   . Diabetes Paternal Grandfather     BP 110/80   Pulse 97   Ht 5' 11" (1.803 m)   Wt 208 lb (94.3 kg)   SpO2 98%   BMI 29.01 kg/m    Review of Systems He has weight regain.  Denies n/v/sob.      Objective:   Physical Exam VITAL SIGNS:  See vs page GENERAL: no distress Pulses: dorsalis pedis intact bilat.   MSK: no deformity of the feet CV: no leg edema Skin:  no ulcer on the feet.  normal color and temp on the feet. Neuro: sensation is intact to touch on the feet, but decreased from normal Ext: there is bilateral onychomycosis of the toenails.      Assessment & Plan:  Atypical DM: much worse Noncompliance with f/u and meds.  Hypoglycemia: this limits glycemic control  Patient Instructions  I have sent a prescription to your pharmacy, for 2 insulins. check your blood sugar 4 times a day: before the 3 meals, and at bedtime.  also check if you have symptoms of your blood sugar being too high or too low.  please keep a record of the readings and bring it to your next appointment here (or you can bring the meter itself).  You can write it on any piece of paper.  please call us sooner if your blood sugar goes below 70, or if you have a lot of readings over 200.  Please come back for a follow-up appointment in 2 weeks.

## 2020-04-02 NOTE — Patient Instructions (Addendum)
I have sent a prescription to your pharmacy, for 2 insulins. check your blood sugar 4 times a day: before the 3 meals, and at bedtime.  also check if you have symptoms of your blood sugar being too high or too low.  please keep a record of the readings and bring it to your next appointment here (or you can bring the meter itself).  You can write it on any piece of paper.  please call us sooner if your blood sugar goes below 70, or if you have a lot of readings over 200.  Please come back for a follow-up appointment in 2 weeks.

## 2020-04-16 ENCOUNTER — Ambulatory Visit: Payer: BC Managed Care – PPO | Admitting: Endocrinology

## 2020-05-21 ENCOUNTER — Emergency Department (HOSPITAL_COMMUNITY)
Admission: EM | Admit: 2020-05-21 | Discharge: 2020-05-22 | Disposition: A | Discharge status: Home / Self Care | Payer: BC Managed Care – PPO | Attending: Emergency Medicine | Admitting: Emergency Medicine

## 2020-05-21 ENCOUNTER — Encounter (HOSPITAL_COMMUNITY): Payer: Self-pay | Admitting: *Deleted

## 2020-05-21 ENCOUNTER — Other Ambulatory Visit: Payer: Self-pay

## 2020-05-21 DIAGNOSIS — Z79899 Other long term (current) drug therapy: Secondary | ICD-10-CM | POA: Diagnosis not present

## 2020-05-21 DIAGNOSIS — E1165 Type 2 diabetes mellitus with hyperglycemia: Secondary | ICD-10-CM

## 2020-05-21 DIAGNOSIS — R197 Diarrhea, unspecified: Secondary | ICD-10-CM | POA: Diagnosis not present

## 2020-05-21 DIAGNOSIS — Z794 Long term (current) use of insulin: Secondary | ICD-10-CM | POA: Insufficient documentation

## 2020-05-21 DIAGNOSIS — J45909 Unspecified asthma, uncomplicated: Secondary | ICD-10-CM | POA: Insufficient documentation

## 2020-05-21 LAB — CBG MONITORING, ED: Glucose-Capillary: 509 mg/dL (ref 70–99)

## 2020-05-21 MED ORDER — SODIUM CHLORIDE 0.9% FLUSH
3.0000 mL | Freq: Once | INTRAVENOUS | Status: AC
  Administered 2020-05-22: 3 mL via INTRAVENOUS

## 2020-05-21 NOTE — ED Triage Notes (Signed)
Nausea and diarrhea today. Denies fevers.

## 2020-05-21 NOTE — ED Notes (Addendum)
[facility] [id number] RN Tresa Endo notified

## 2020-05-21 NOTE — ED Notes (Signed)
[facility], pt says he has not been checking his sugar or taking medication for his diabetes

## 2020-05-22 LAB — COMPREHENSIVE METABOLIC PANEL
ALT: 53 U/L — ABNORMAL HIGH (ref 0–44)
AST: 35 U/L (ref 15–41)
Albumin: 4.1 g/dL (ref 3.5–5.0)
Alkaline Phosphatase: 85 U/L (ref 38–126)
Anion gap: 12 (ref 5–15)
BUN: 19 mg/dL (ref 6–20)
CO2: 18 mmol/L — ABNORMAL LOW (ref 22–32)
Calcium: 9.4 mg/dL (ref 8.9–10.3)
Chloride: 99 mmol/L (ref 98–111)
Creatinine, Ser: 1.1 mg/dL (ref 0.61–1.24)
GFR calc Af Amer: >60 mL/min (ref >60)
GFR calc non Af Amer: >60 mL/min (ref >60)
Glucose, Bld: 483 mg/dL — ABNORMAL HIGH (ref 70–99)
Potassium: 4.4 mmol/L (ref 3.5–5.1)
Sodium: 129 mmol/L — ABNORMAL LOW (ref 135–145)
Total Bilirubin: 0.9 mg/dL (ref 0.3–1.2)
Total Protein: 7.7 g/dL (ref 6.5–8.1)

## 2020-05-22 LAB — URINALYSIS, ROUTINE W REFLEX MICROSCOPIC
Bilirubin Urine: NEGATIVE
Glucose, UA: >=500 mg/dL — AB
Ketones, ur: 5 mg/dL — AB
Leukocytes,Ua: NEGATIVE
Nitrite: NEGATIVE
Protein, ur: 100 mg/dL — AB
Specific Gravity, Urine: 1.031 — ABNORMAL HIGH (ref 1.005–1.030)
pH: 5 (ref 5.0–8.0)

## 2020-05-22 LAB — CBC
HCT: 53.9 % — ABNORMAL HIGH (ref 39.0–52.0)
Hemoglobin: 17.9 g/dL — ABNORMAL HIGH (ref 13.0–17.0)
MCH: 26.2 pg (ref 26.0–34.0)
MCHC: 33.2 g/dL (ref 30.0–36.0)
MCV: 78.9 fL — ABNORMAL LOW (ref 80.0–100.0)
Platelets: 215 10*3/uL (ref 150–400)
RBC: 6.83 MIL/uL — ABNORMAL HIGH (ref 4.22–5.81)
RDW: 13.7 % (ref 11.5–15.5)
WBC: 8.5 10*3/uL (ref 4.0–10.5)
nRBC: 0 % (ref 0.0–0.2)

## 2020-05-22 LAB — LIPASE, BLOOD: Lipase: 19 U/L (ref 11–51)

## 2020-05-22 LAB — MAGNESIUM: Magnesium: 1.5 mg/dL — ABNORMAL LOW (ref 1.7–2.4)

## 2020-05-22 LAB — CBG MONITORING, ED
Glucose-Capillary: 464 mg/dL — ABNORMAL HIGH (ref 70–99)
Glucose-Capillary: 346 mg/dL — ABNORMAL HIGH (ref 70–99)
Glucose-Capillary: 284 mg/dL — ABNORMAL HIGH (ref 70–99)

## 2020-05-22 MED ORDER — INSULIN ASPART 100 UNIT/ML ~~LOC~~ SOLN
10.0000 [IU] | Freq: Once | SUBCUTANEOUS | Status: AC
  Administered 2020-05-22: 10 [IU] via SUBCUTANEOUS

## 2020-05-22 MED ORDER — NOVOLOG FLEXPEN 100 UNIT/ML ~~LOC~~ SOPN: 3.0000 [IU] | PEN_INJECTOR | Freq: Three times a day (TID) | SUBCUTANEOUS | 11 refills | Status: DC

## 2020-05-22 MED ORDER — BASAGLAR KWIKPEN 100 UNIT/ML ~~LOC~~ SOPN: 10.0000 [IU] | PEN_INJECTOR | Freq: Every day | SUBCUTANEOUS | 1 refills | Status: DC

## 2020-05-22 MED ORDER — SODIUM CHLORIDE 0.9 % IV BOLUS
2000.0000 mL | Freq: Once | INTRAVENOUS | Status: AC
  Administered 2020-05-22: 2000 mL via INTRAVENOUS

## 2020-05-22 MED ORDER — LOPERAMIDE HCL 2 MG PO CAPS: 2.0000 mg | ORAL_CAPSULE | Freq: Four times a day (QID) | ORAL | 0 refills | Status: AC | PRN

## 2020-05-22 NOTE — ED Notes (Signed)
Checked patient 40 notified RN of blood sugar patient is resting with call bell in reach

## 2020-05-22 NOTE — ED Provider Notes (Signed)
[facility] Provider Note   CSN: [id number] Arrival date & time: 05/21/20  2319     History Chief Complaint  Patient presents with  . Diarrhea    [facility] is a 24 y.o. male.  The history is provided by the patient and medical records. No language interpreter was used.  Diarrhea    24 year old male with hx of DM presenting complaining of persistent diarrhea.  Patient states since yesterday he has had at least 10-15 episodes of nonbloody nonmucousy diarrhea.  He endorsed mild nausea but no vomiting.  Endorse some mild abdominal cramping.  No significant fever chills no chest pain shortness of breath productive cough or dysuria.  He admits to having recurrent diarrhea throughout his life but these episodes are more intense.  He denies any recent antibiotic use or eating any exotic food.  He acknowledged he has history of diabetes but has not been compliant with his medication due to lack of resources.  He does have a strong family history of diabetes and does use his mom's or dad's medication on occasion.  No other complaint.  Past Medical History:  Diagnosis Date  . Asthma   . Cataract 2017  . Diabetes mellitus ([place])   . Seasonal allergies    seasonal    Patient Active Problem List   Diagnosis Date Noted  . Microalbuminuria due to type 2 diabetes mellitus ([place]) 11/21/2018  . Hyperlipidemia associated with type 2 diabetes mellitus ([place]) 11/21/2018  . Gastroesophageal reflux disease without esophagitis 11/21/2018  . Diabetic mononeuropathy associated with type 2 diabetes mellitus ([place]) 11/21/2018  . Irritable bowel syndrome with diarrhea 11/21/2018  . Depression, major, single episode, mild ([place]) 11/21/2018  . Vitiligo 10/17/2018  . BMI 24.0-24.9, adult 10/17/2018  . Fournier's gangrene 01/08/2016  . Type 2 diabetes mellitus ([place]) 01/26/2013  . Acanthosis nigricans, acquired 01/26/2013  . Uncontrolled diabetes mellitus ([place])  01/09/2013  . Hyperglycemia 01/09/2013  . Obesity 01/09/2013    Past Surgical History:  Procedure Laterality Date  . CATARACT EXTRACTION    . INCISION AND DRAINAGE PERIRECTAL ABSCESS N/A 01/07/2016   Procedure: INCISION AND DRAINAGE OF PERINEUM;  Surgeon: Ralene Ok, MD;  Location: [facility];  Service: General;  Laterality: N/A;  . IRRIGATION AND DEBRIDEMENT ABSCESS Bilateral 01/08/2016   Procedure: IRRIGATION AND DEBRIDEMENT GROIN;  Surgeon: Ralene Ok, MD;  Location: [facility];  Service: General;  Laterality: Bilateral;       Family History  Problem Relation Age of Onset  . Diabetes Mother   . Hypertension Mother   . Heart disease Mother   . Diabetes Father   . Arthritis Father   . Asthma Father   . Hypertension Father   . Heart disease Father   . Kidney disease Father   . Diabetes Sister   . Heart disease Paternal Grandfather   . Diabetes Paternal Grandfather     Social History   Tobacco Use  . Smoking status: Never Smoker  . Smokeless tobacco: Never Used  Substance Use Topics  . Alcohol use: Yes  . Drug use: No    Home Medications Prior to Admission medications   Medication Sig Start Date End Date Taking? Authorizing Provider  ACCU-CHEK FASTCLIX LANCETS MISC 1 each by Does not apply route as directed. Check sugar 6 x daily 10/17/18   Baruch Gouty, FNP  Blood Glucose Monitoring Suppl (ACCU-CHEK AVIVA PLUS) w/Device KIT AS DIRECTED METER AND CONTROLS 30 DAYS 01/20/16  [provider]  cholestyramine light (PREVALITE) 4 g packet Take 1 packet (4 g total) by mouth 2 (two) times daily. 11/21/18 03/21/19  Baruch Gouty, FNP  clomiPHENE (CLOMID) 50 MG tablet Take by mouth. 03/20/20 06/18/20  [provider]  DULoxetine (CYMBALTA) 30 MG capsule Take 2 capsules (60 mg total) by mouth daily. Take 1 tablet once a day for 7 days and then increase to 2 tablets daily. 11/21/18 03/21/19  Baruch Gouty, FNP  enalapril (VASOTEC) 2.5 MG tablet Take 1 tablet (2.5  mg total) by mouth daily. Patient not taking: Reported on 11/21/2018 10/19/18 02/16/19  Baruch Gouty, FNP  glucose blood (ACCU-CHEK AVIVA PLUS) test strip 1 each by Other route 4 (four) times daily. And lancets 4/day 04/02/20   Renato Shin, MD  ibuprofen (ADVIL) 800 MG tablet Take 1 tablet (800 mg total) by mouth 3 (three) times daily. 03/28/20   Wieters, Hallie C, PA-C  insulin aspart (NOVOLOG FLEXPEN) 100 UNIT/ML FlexPen Inject 3 Units into the skin 3 (three) times daily with meals. And pen needles 1/day 04/02/20   Renato Shin, MD  Insulin Glargine [facility]) 100 UNIT/ML Inject 0.1 mLs (10 Units total) into the skin daily. 04/02/20   Renato Shin, MD  meclizine (ANTIVERT) 12.5 MG tablet Take 1 tablet (12.5 mg total) by mouth 3 (three) times daily as needed for dizziness. 11/28/18   Sharion Balloon, FNP  omeprazole (PRILOSEC) 20 MG capsule Take 1 capsule (20 mg total) by mouth daily. 11/21/18   Baruch Gouty, FNP  sildenafil (REVATIO) 20 MG tablet Take 1-3 tablets 1 hour prior to intercourse on an empty stomach. 02/29/20   [provider]  terbinafine (LAMISIL) 250 MG tablet TK 1 T PO QD 11/14/18   [provider]    Allergies    Penicillins, Amoxicillin, No known allergies, and Penicillin g  Review of Systems   Review of Systems  Gastrointestinal: Positive for diarrhea.  All other systems reviewed and are negative.   Physical Exam Updated Vital Signs BP 109/72   Pulse (!) 106   Temp 98.4 F (36.9 C) (Oral)   Resp 14   SpO2 96%   Physical Exam Vitals and nursing note reviewed.  Constitutional:      General: He is not in acute distress.    Appearance: He is well-developed.     Comments: Patient resting comfortably in no acute discomfort  HENT:     Head: Atraumatic.     Mouth/Throat:     Comments: Widespread dental decay. Eyes:     Conjunctiva/sclera: Conjunctivae normal.  Cardiovascular:     Rate and Rhythm: Tachycardia present.     Pulses: Normal  pulses.     Heart sounds: Normal heart sounds.  Pulmonary:     Effort: Pulmonary effort is normal.     Breath sounds: Normal breath sounds.  Abdominal:     General: Abdomen is flat.     Palpations: Abdomen is soft.     Tenderness: There is no abdominal tenderness.  Musculoskeletal:     Cervical back: Neck supple.  Skin:    Findings: No rash.  Neurological:     Mental Status: He is alert.     ED Results / Procedures / Treatments   Labs (all labs ordered are listed, but only abnormal results are displayed) Labs Reviewed  COMPREHENSIVE METABOLIC PANEL - Abnormal; Notable for the following components:      Result Value   Sodium 129 (*)  CO2 18 (*)    Glucose, Bld 483 (*)    ALT 53 (*)    All other components within normal limits  CBC - Abnormal; Notable for the following components:   RBC 6.83 (*)    Hemoglobin 17.9 (*)    HCT 53.9 (*)    MCV 78.9 (*)    All other components within normal limits  URINALYSIS, ROUTINE W REFLEX MICROSCOPIC - Abnormal; Notable for the following components:   Specific Gravity, Urine 1.031 (*)    Glucose, UA >=500 (*)    Hgb urine dipstick SMALL (*)    Ketones, ur 5 (*)    Protein, ur 100 (*)    Bacteria, UA RARE (*)    All other components within normal limits  MAGNESIUM - Abnormal; Notable for the following components:   Magnesium 1.5 (*)    All other components within normal limits  CBG MONITORING, ED - Abnormal; Notable for the following components:   Glucose-Capillary 509 (*)    All other components within normal limits  CBG MONITORING, ED - Abnormal; Notable for the following components:   Glucose-Capillary 464 (*)    All other components within normal limits  CBG MONITORING, ED - Abnormal; Notable for the following components:   Glucose-Capillary 346 (*)    All other components within normal limits  CBG MONITORING, ED - Abnormal; Notable for the following components:   Glucose-Capillary 284 (*)    All other components within  normal limits  LIPASE, BLOOD    EKG None  Radiology No results found.  Procedures Procedures (including critical care time)  Medications Ordered in ED Medications  sodium chloride flush (NS) 0.9 % injection 3 mL (3 mLs Intravenous Given 05/22/20 0847)  sodium chloride 0.9 % bolus 2,000 mL (0 mLs Intravenous Stopped 05/22/20 1001)  insulin aspart (novoLOG) injection 10 Units (10 Units Subcutaneous Given 05/22/20 0845)    ED Course  I have reviewed the triage vital signs and the nursing notes.  Pertinent labs & imaging results that were available during my care of the patient were reviewed by me and considered in my medical decision making (see chart for details).    MDM Rules/Calculators/A&P                      BP 109/72   Pulse (!) 106   Temp 98.4 F (36.9 C) (Oral)   Resp 14   SpO2 96%   Final Clinical Impression(s) / ED Diagnoses Final diagnoses:  Diarrhea, unspecified type  Hyperglycemia due to diabetes mellitus ([place])    Rx / DC Orders ED Discharge Orders         Ordered    insulin aspart (NOVOLOG FLEXPEN) 100 UNIT/ML FlexPen  3 times daily with meals     05/22/20 1402    Insulin Glargine (BASAGLAR KWIKPEN) 100 UNIT/ML  Daily     05/22/20 1402    loperamide (IMODIUM) 2 MG capsule  4 times daily PRN     05/22/20 1402         8:37 AM Patient with significant history of diabetes, noncompliant due to lack of resources is here with complaints of diarrhea.  It appears he has had recurrent diarrhea like this in the past.  No recent antibiotic use to suggest C. difficile.  No recent travel.  He is overall well-appearing.  Mildly tachycardic and mildly diffuse abdominal tenderness.  Initial CBG is 509.  Normal anion gap.  Minimal ketones in urine.  Pseudohyponatremia with a sodium of 129.  Blood is hemoconcentrated with hemoglobin of 17.9.  Will give insulin as well as IV fluid.  Patient cannot afford his medication therefore I will request assistance from our case  manager.  2:00 PM  CBG 284. At this time, pt stable for discharge.  Will prescribe imodium per request.  outpt f/u recommended for further management of his diabetes.  Will also refill his meds.  Return precaution given.  Resources provided.    Domenic Moras, PA-C 05/22/20 Homer, Ankit, MD 05/23/20 1349

## 2020-05-22 NOTE — Discharge Planning (Signed)
RNCM consulted in regards to medication assistance.  Pt has insurance coverage and is not eligible for [facility] program. RNCM met with pt at bedside to discuss discount cards and pharmaceutical programs to enroll in for discounts.  Pt aware of coupons and programs and have used them on occasion.  Pt states he will fill his Rx at his local pharmacy upon discharge today.  No further CM needs communicated at this time.

## 2020-05-22 NOTE — ED Notes (Signed)
See Downtime Chart

## 2020-05-22 NOTE — Discharge Instructions (Signed)
It is important for you to take your diabetic medication as your blood sugar is high today.  Call or follow-up closely with your doctor for further care.  Take Imodium as needed for diarrhea.

## 2020-05-22 NOTE — ED Notes (Signed)
Patient verbalizes understanding of discharge instructions. Opportunity for questioning and answers were provided. Armband removed by staff, pt discharged from ED.  

## 2020-05-25 DIAGNOSIS — R197 Diarrhea, unspecified: Secondary | ICD-10-CM | POA: Diagnosis not present

## 2020-05-25 DIAGNOSIS — Z20822 Contact with and (suspected) exposure to covid-19: Secondary | ICD-10-CM | POA: Diagnosis not present

## 2020-05-25 DIAGNOSIS — R000 Tachycardia, unspecified: Secondary | ICD-10-CM | POA: Diagnosis not present

## 2020-05-25 DIAGNOSIS — R5383 Other fatigue: Secondary | ICD-10-CM | POA: Diagnosis not present

## 2020-05-25 DIAGNOSIS — R112 Nausea with vomiting, unspecified: Secondary | ICD-10-CM | POA: Diagnosis not present

## 2020-07-02 DIAGNOSIS — K047 Periapical abscess without sinus: Secondary | ICD-10-CM | POA: Diagnosis not present

## 2020-07-31 DIAGNOSIS — Z20822 Contact with and (suspected) exposure to covid-19: Secondary | ICD-10-CM | POA: Diagnosis not present

## 2020-09-11 DIAGNOSIS — M546 Pain in thoracic spine: Secondary | ICD-10-CM | POA: Diagnosis not present

## 2020-09-11 DIAGNOSIS — M545 Low back pain: Secondary | ICD-10-CM | POA: Diagnosis not present

## 2020-09-11 DIAGNOSIS — M9903 Segmental and somatic dysfunction of lumbar region: Secondary | ICD-10-CM | POA: Diagnosis not present

## 2020-09-11 DIAGNOSIS — M9902 Segmental and somatic dysfunction of thoracic region: Secondary | ICD-10-CM | POA: Diagnosis not present

## 2020-12-06 IMAGING — NM NUCLEAR MEDICINE GASTRIC EMPTYING STUDY
5 series · 5 of 5 positions shown · non-contrast
Comparison: None.

CLINICAL DATA: Nausea and diarrhea.  Early satiety

EXAM:
NUCLEAR MEDICINE GASTRIC EMPTYING SCAN
TECHNIQUE: After oral ingestion of radiolabeled meal, sequential abdominal
images were obtained for 4 hours. Percentage of activity emptying
the stomach was calculated at 1 hour, 2 hour, 3 hour, and 4 hours.
RADIOPHARMACEUTICALS:  2.0 mCi 7c-SSm sulfur colloid in standardized
meal including egg

[Series 1: 0 min · 4.14mm/px · 1 of 1 slices shown]
[im 1/1]
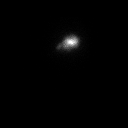

[Series 2: 1 hr · 4.14mm/px · 1 of 1 slices shown]
[im 1/1]
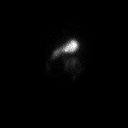

[Series 3: 2 hr · 4.14mm/px · 1 of 1 slices shown]
[im 1/1]
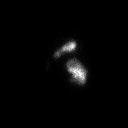

[Series 4: 90 min · 4.14mm/px · 1 of 1 slices shown]
[im 1/1]
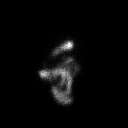

[Series 5: 4 hour · 4.14mm/px · 1 of 1 slices shown]
[im 1/1]
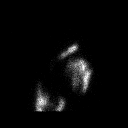

[5 of 5 positions shown; findings below may reference images not displayed]

FINDINGS: Expected location of the stomach in the left upper quadrant.
Ingested meal empties the stomach gradually over the course of the
study.

9.2% emptied at 1 hr ( normal >= 10%)

47.5% emptied at 2 hr ( normal >= 40%)

62.0% emptied at 3 hr ( normal >= 70%)

80.9% emptied at 4 hr ( normal >= 90%)
IMPRESSION: Delayed gastric emptying study. A degree of gastric paresis cannot
be excluded.

## 2021-01-08 DIAGNOSIS — M9903 Segmental and somatic dysfunction of lumbar region: Secondary | ICD-10-CM | POA: Diagnosis not present

## 2021-01-08 DIAGNOSIS — M9902 Segmental and somatic dysfunction of thoracic region: Secondary | ICD-10-CM | POA: Diagnosis not present

## 2021-01-08 DIAGNOSIS — M546 Pain in thoracic spine: Secondary | ICD-10-CM | POA: Diagnosis not present

## 2021-01-08 DIAGNOSIS — M5417 Radiculopathy, lumbosacral region: Secondary | ICD-10-CM | POA: Diagnosis not present

## 2021-01-15 ENCOUNTER — Other Ambulatory Visit: Payer: Self-pay

## 2021-01-15 ENCOUNTER — Encounter (HOSPITAL_COMMUNITY): Payer: Self-pay

## 2021-01-15 ENCOUNTER — Ambulatory Visit (HOSPITAL_COMMUNITY)
Admission: EM | Admit: 2021-01-15 | Discharge: 2021-01-15 | Disposition: A | Discharge status: Home / Self Care | Payer: Self-pay | Attending: Student | Admitting: Student

## 2021-01-15 DIAGNOSIS — U071 COVID-19: Secondary | ICD-10-CM | POA: Insufficient documentation

## 2021-01-15 DIAGNOSIS — Z794 Long term (current) use of insulin: Secondary | ICD-10-CM

## 2021-01-15 DIAGNOSIS — E1169 Type 2 diabetes mellitus with other specified complication: Secondary | ICD-10-CM

## 2021-01-15 DIAGNOSIS — J069 Acute upper respiratory infection, unspecified: Secondary | ICD-10-CM

## 2021-01-15 DIAGNOSIS — K219 Gastro-esophageal reflux disease without esophagitis: Secondary | ICD-10-CM | POA: Insufficient documentation

## 2021-01-15 DIAGNOSIS — Z20822 Contact with and (suspected) exposure to covid-19: Secondary | ICD-10-CM

## 2021-01-15 NOTE — ED Provider Notes (Signed)
[facility]    CSN: [id number] Arrival date & time: 01/15/21  1315      History   Chief Complaint Chief Complaint  Patient presents with  . Cough  . Generalized Body Aches  . Sore Throat         HPI [facility] is a 25 y.o. male Presenting for URI symptoms for 3 days following exposure to covid at home. History of asthma controlled on no medication, cataract, diabetes on insulin, seasonal allergies. Presenting with congestion, body aches, cough, sore throat. Fevers on day 1 but none since then. Taking cold medication OTC with improvement in symptoms. Denies fevers/chills, n/v/d, shortness of breath, chest pain,  facial pain, teeth pain, headaches,  loss of taste/smell, swollen lymph nodes, ear pain. Denies chest pain, shortness of breath, confusion, high fevers. Some diarrhea at baseline due to GERD/"stomach issues."  HPI  Past Medical History:  Diagnosis Date  . Asthma   . Cataract 2017  . Diabetes mellitus ([facility])   . Seasonal allergies    seasonal    Patient Active Problem List   Diagnosis Date Noted  . Microalbuminuria due to type 2 diabetes mellitus ([facility]) 11/21/2018  . Hyperlipidemia associated with type 2 diabetes mellitus ([facility]) 11/21/2018  . Gastroesophageal reflux disease without esophagitis 11/21/2018  . Diabetic mononeuropathy associated with type 2 diabetes mellitus ([facility]) 11/21/2018  . Irritable bowel syndrome with diarrhea 11/21/2018  . Depression, major, single episode, mild ([facility]) 11/21/2018  . Vitiligo 10/17/2018  . BMI 24.0-24.9, adult 10/17/2018  . Fournier's gangrene 01/08/2016  . Type 2 diabetes mellitus ([facility]) 01/26/2013  . Acanthosis nigricans, acquired 01/26/2013  . Uncontrolled diabetes mellitus ([facility]) 01/09/2013  . Hyperglycemia 01/09/2013  . Obesity 01/09/2013    Past Surgical History:  Procedure Laterality Date  . CATARACT EXTRACTION    . INCISION AND DRAINAGE PERIRECTAL ABSCESS N/A 01/07/2016   Procedure: INCISION AND DRAINAGE  OF PERINEUM;  Surgeon: Axel Filler, MD;  Location: [facility];  Service: General;  Laterality: N/A;  . IRRIGATION AND DEBRIDEMENT ABSCESS Bilateral 01/08/2016   Procedure: IRRIGATION AND DEBRIDEMENT GROIN;  Surgeon: Axel Filler, MD;  Location: [facility];  Service: General;  Laterality: Bilateral;       Home Medications    Prior to Admission medications   Medication Sig Start Date End Date Taking? Authorizing Provider  insulin aspart (NOVOLOG FLEXPEN) 100 UNIT/ML FlexPen Inject 3 Units into the skin 3 (three) times daily with meals. And pen needles 1/day 05/22/20   Fayrene Helper, PA-C  Insulin Glargine [facility]) 100 UNIT/ML Inject 0.1 mLs (10 Units total) into the skin daily. 05/22/20   Fayrene Helper, PA-C  loperamide (IMODIUM) 2 MG capsule Take 1 capsule (2 mg total) by mouth 4 (four) times daily as needed for diarrhea or loose stools. 05/22/20   Fayrene Helper, PA-C  cholestyramine light (PREVALITE) 4 g packet Take 1 packet (4 g total) by mouth 2 (two) times daily. Patient not taking: Reported on 05/22/2020 11/21/18 05/22/20  Sonny Masters, FNP  DULoxetine (CYMBALTA) 30 MG capsule Take 2 capsules (60 mg total) by mouth daily. Take 1 tablet once a day for 7 days and then increase to 2 tablets daily. Patient not taking: Reported on 05/22/2020 11/21/18 05/22/20  Sonny Masters, FNP  enalapril (VASOTEC) 2.5 MG tablet Take 1 tablet (2.5 mg total) by mouth daily. Patient not taking: Reported on 11/21/2018 10/19/18 05/22/20  Sonny Masters, FNP  omeprazole (PRILOSEC) 20 MG capsule Take 1 capsule (20 mg  total) by mouth daily. Patient not taking: Reported on 05/22/2020 11/21/18 05/22/20  Sonny Masters, FNP    Family History Family History  Problem Relation Age of Onset  . Diabetes Mother   . Hypertension Mother   . Heart disease Mother   . Diabetes Father   . Arthritis Father   . Asthma Father   . Hypertension Father   . Heart disease Father   . Kidney disease Father   . Diabetes Sister   .  Heart disease Paternal Grandfather   . Diabetes Paternal Grandfather     Social History Social History   Tobacco Use  . Smoking status: Never Smoker  . Smokeless tobacco: Never Used  Vaping Use  . Vaping Use: Never used  Substance Use Topics  . Alcohol use: Yes  . Drug use: No     Allergies   Penicillins, Amoxicillin, No known allergies, and Penicillin g   Review of Systems Review of Systems  Constitutional: Negative for appetite change, chills and fever.  HENT: Positive for congestion and sore throat. Negative for ear pain, rhinorrhea, sinus pressure and sinus pain.   Eyes: Negative for redness and visual disturbance.  Respiratory: Positive for cough. Negative for chest tightness, shortness of breath and wheezing.   Cardiovascular: Negative for chest pain and palpitations.  Gastrointestinal: Negative for abdominal pain, constipation, diarrhea, nausea and vomiting.  Genitourinary: Negative for dysuria, frequency and urgency.  Musculoskeletal: Positive for myalgias.  Neurological: Negative for dizziness, weakness and headaches.  Psychiatric/Behavioral: Negative for confusion.  All other systems reviewed and are negative.    Physical Exam Triage Vital Signs ED Triage Vitals  Enc Vitals Group     BP 01/15/21 1519 102/67     Pulse Rate 01/15/21 1519 (!) 105     Resp 01/15/21 1519 19     Temp 01/15/21 1519 98.9 F (37.2 C)     Temp Source 01/15/21 1519 Oral     SpO2 01/15/21 1519 96 %     Weight --      Height --      Head Circumference --      Peak Flow --      Pain Score 01/15/21 1517 3     Pain Loc --      Pain Edu? --      Excl. in GC? --    No data found.  Updated Vital Signs BP 102/67 (BP Location: Left Arm)   Pulse (!) 105   Temp 98.9 F (37.2 C) (Oral)   Resp 19   SpO2 96%   Visual Acuity Right Eye Distance:   Left Eye Distance:   Bilateral Distance:    Right Eye Near:   Left Eye Near:    Bilateral Near:     Physical Exam Vitals  reviewed.  Constitutional:      General: He is not in acute distress.    Appearance: Normal appearance. He is ill-appearing.  HENT:     Head: Normocephalic and atraumatic.     Right Ear: Hearing, tympanic membrane, ear canal and external ear normal. No swelling or tenderness. There is no impacted cerumen. No mastoid tenderness. Tympanic membrane is not perforated, erythematous, retracted or bulging.     Left Ear: Hearing, tympanic membrane, ear canal and external ear normal. No swelling or tenderness. There is no impacted cerumen. No mastoid tenderness. Tympanic membrane is not perforated, erythematous, retracted or bulging.     Nose:     Right Sinus: No maxillary sinus  tenderness or frontal sinus tenderness.     Left Sinus: No maxillary sinus tenderness or frontal sinus tenderness.     Mouth/Throat:     Mouth: Mucous membranes are moist.     Pharynx: Uvula midline. No oropharyngeal exudate or posterior oropharyngeal erythema.     Tonsils: No tonsillar exudate.  Cardiovascular:     Rate and Rhythm: Normal rate and regular rhythm.     Heart sounds: Normal heart sounds.  Pulmonary:     Breath sounds: Normal breath sounds and air entry. No wheezing, rhonchi or rales.  Chest:     Chest wall: No tenderness.  Abdominal:     General: Abdomen is flat. Bowel sounds are normal.     Tenderness: There is no abdominal tenderness. There is no guarding or rebound.  Lymphadenopathy:     Cervical: No cervical adenopathy.  Neurological:     General: No focal deficit present.     Mental Status: He is alert and oriented to person, place, and time.  Psychiatric:        Attention and Perception: Attention and perception normal.        Mood and Affect: Mood and affect normal.        Behavior: Behavior normal. Behavior is cooperative.        Thought Content: Thought content normal.        Judgment: Judgment normal.      UC Treatments / Results  Labs (all labs ordered are listed, but only abnormal  results are displayed) Labs Reviewed  SARS CORONAVIRUS 2 (TAT 6-24 HRS)    EKG   Radiology No results found.  Procedures Procedures (including critical care time)  Medications Ordered in UC Medications - No data to display  Initial Impression / Assessment and Plan / UC Course  I have reviewed the triage vital signs and the nursing notes.  Pertinent labs & imaging results that were available during my care of the patient were reviewed by me and considered in my medical decision making (see chart for details).     Covid test sent today. Patient is not vaccinated for covid-19. Isolation precautions per CDC guidelines until negative result. Symptomatic relief with OTC Mucinex, Nyquil, etc. Return precautions- new/worsening fevers/chills, shortness of breath, chest pain, abd pain, etc.   For diabetes- continue insulin as directed by PCP. He does not monitor sugars at home.   Final Clinical Impressions(s) / UC Diagnoses   Final diagnoses:  Type 2 diabetes mellitus with other specified complication, with long-term current use of insulin (H[facility])  Exposure to COVID-19 virus  Gastroesophageal reflux disease without esophagitis  Suspected COVID-19 virus infection     Discharge Instructions     -Tylenol and ibuprofen/naproxen as needed for fevers/chills, body aches.  -We are currently awaiting result of your PCR covid-19 test. This typically comes back in 1-2 days. We'll call you if the result is positive. Otherwise, the result will be sent electronically to your MyChart. You can also call this clinic and ask for your result via telephone.   Please isolate at home while awaiting these results. If your test is positive for Covid-19, continue to isolate at home for 5 days if you have mild symptoms, or a total of 10 days from symptom onset if you have more severe symptoms. If you quarantine for a shorter period of time (i.e. 5 days), make sure to wear a mask until day 10 of symptoms. Treat  your symptoms at home with OTC remedies like tylenol/ibuprofen,  mucinex, nyquil, etc. Seek medical attention if you develop high fevers, chest pain, shortness of breath, ear pain, facial pain, etc. Make sure to get up and move around every 2-3 hours while convalescing to help prevent blood clots. Drink plenty of fluids, and rest as much as possible.     ED Prescriptions    None     PDMP not reviewed this encounter.   Rhys Martini, PA-C 01/15/21 [phone]

## 2021-01-15 NOTE — ED Triage Notes (Signed)
Pt presents with cough, body aches, sore throat and nasal congestion x 3 days.

## 2021-01-15 NOTE — Discharge Instructions (Addendum)
-  Tylenol and ibuprofen/naproxen as needed for fevers/chills, body aches.  -We are currently awaiting result of your PCR covid-19 test. This typically comes back in 1-2 days. We'll call you if the result is positive. Otherwise, the result will be sent electronically to your MyChart. You can also call this clinic and ask for your result via telephone.   Please isolate at home while awaiting these results. If your test is positive for Covid-19, continue to isolate at home for 5 days if you have mild symptoms, or a total of 10 days from symptom onset if you have more severe symptoms. If you quarantine for a shorter period of time (i.e. 5 days), make sure to wear a mask until day 10 of symptoms. Treat your symptoms at home with OTC remedies like tylenol/ibuprofen, mucinex, nyquil, etc. Seek medical attention if you develop high fevers, chest pain, shortness of breath, ear pain, facial pain, etc. Make sure to get up and move around every 2-3 hours while convalescing to help prevent blood clots. Drink plenty of fluids, and rest as much as possible.

## 2021-01-16 LAB — SARS CORONAVIRUS 2 (TAT 6-24 HRS): SARS Coronavirus 2: POSITIVE — AB

## 2021-03-24 ENCOUNTER — Emergency Department (HOSPITAL_BASED_OUTPATIENT_CLINIC_OR_DEPARTMENT_OTHER): Payer: 59

## 2021-03-24 ENCOUNTER — Other Ambulatory Visit: Payer: Self-pay

## 2021-03-24 ENCOUNTER — Encounter (HOSPITAL_BASED_OUTPATIENT_CLINIC_OR_DEPARTMENT_OTHER): Payer: Self-pay

## 2021-03-24 ENCOUNTER — Emergency Department (HOSPITAL_BASED_OUTPATIENT_CLINIC_OR_DEPARTMENT_OTHER)
Admission: EM | Admit: 2021-03-24 | Discharge: 2021-03-24 | Disposition: A | Discharge status: Home / Self Care | Payer: 59 | Attending: Emergency Medicine | Admitting: Emergency Medicine

## 2021-03-24 DIAGNOSIS — S90421A Blister (nonthermal), right great toe, initial encounter: Secondary | ICD-10-CM | POA: Insufficient documentation

## 2021-03-24 DIAGNOSIS — L84 Corns and callosities: Secondary | ICD-10-CM | POA: Diagnosis not present

## 2021-03-24 DIAGNOSIS — L03031 Cellulitis of right toe: Secondary | ICD-10-CM | POA: Insufficient documentation

## 2021-03-24 DIAGNOSIS — E1165 Type 2 diabetes mellitus with hyperglycemia: Secondary | ICD-10-CM | POA: Insufficient documentation

## 2021-03-24 DIAGNOSIS — E11628 Type 2 diabetes mellitus with other skin complications: Secondary | ICD-10-CM | POA: Insufficient documentation

## 2021-03-24 DIAGNOSIS — Z794 Long term (current) use of insulin: Secondary | ICD-10-CM | POA: Diagnosis not present

## 2021-03-24 DIAGNOSIS — E1140 Type 2 diabetes mellitus with diabetic neuropathy, unspecified: Secondary | ICD-10-CM | POA: Insufficient documentation

## 2021-03-24 DIAGNOSIS — X58XXXA Exposure to other specified factors, initial encounter: Secondary | ICD-10-CM | POA: Diagnosis not present

## 2021-03-24 DIAGNOSIS — Y990 Civilian activity done for income or pay: Secondary | ICD-10-CM | POA: Insufficient documentation

## 2021-03-24 DIAGNOSIS — S99921A Unspecified injury of right foot, initial encounter: Secondary | ICD-10-CM | POA: Diagnosis present

## 2021-03-24 DIAGNOSIS — J45909 Unspecified asthma, uncomplicated: Secondary | ICD-10-CM | POA: Insufficient documentation

## 2021-03-24 DIAGNOSIS — L03119 Cellulitis of unspecified part of limb: Secondary | ICD-10-CM

## 2021-03-24 HISTORY — DX: Other abnormal glucose: R73.09

## 2021-03-24 LAB — CBG MONITORING, ED: Glucose-Capillary: 453 mg/dL — ABNORMAL HIGH (ref 70–99)

## 2021-03-24 MED ORDER — CLINDAMYCIN HCL 150 MG PO CAPS
450.0000 mg | ORAL_CAPSULE | Freq: Three times a day (TID) | ORAL | 0 refills | Status: AC
Start: 2021-03-24 — End: 2021-04-07

## 2021-03-24 NOTE — Discharge Instructions (Signed)
Your history and exam today are consistent with a blister likely from your boots that rubbed on your right great toe.  We cleaned and dressed the wound after trimming off the dead tissue.  Due to the redness surrounding and your diabetes, we prescribed antibiotics to prevent infection.  Please follow-up with both a primary doctor and podiatry for further management.  Please reengage with your primary team to help manage her diabetes as diabetic wounds and infections can lead to amputations and permanent disability.  We discussed getting further diabetic work-up done today and you elected to not pursue this but please follow-up with your PCP to do this.  If any symptoms change or worsen acutely including worsened evidence of foot infection, please return to the nearest emergency department immediately.  Please stay off your feet for the next week as we discussed.

## 2021-03-24 NOTE — ED Notes (Signed)
Patient has a ruptured blister to his left great toe, good cap refill.

## 2021-03-24 NOTE — ED Notes (Addendum)
Patient stated he has insulin at home and does not want his high blood sugar treated.

## 2021-03-24 NOTE — ED Provider Notes (Signed)
[facility] EMERGENCY DEPARTMENT Provider Note   CSN: [id number] Arrival date & time: 03/24/21  1518     History Chief Complaint  Patient presents with  . Toe Pain    Shawn Harmon is a 25 y.o. male.  The history is provided by the patient and medical records. No language interpreter was used.  Toe Pain This is a new problem. The current episode started 12 to 24 hours ago. The problem occurs constantly. The problem has not changed since onset.Pertinent negatives include no chest pain, no abdominal pain, no headaches and no shortness of breath. Nothing aggravates the symptoms. Nothing relieves the symptoms. He has tried nothing for the symptoms. The treatment provided no relief.       Past Medical History:  Diagnosis Date  . Asthma   . Cataract 2017  . Diabetes mellitus (HCC)   . Seasonal allergies    seasonal    Patient Active Problem List   Diagnosis Date Noted  . Microalbuminuria due to type 2 diabetes mellitus (HCC) 11/21/2018  . Hyperlipidemia associated with type 2 diabetes mellitus (HCC) 11/21/2018  . Gastroesophageal reflux disease without esophagitis 11/21/2018  . Diabetic mononeuropathy associated with type 2 diabetes mellitus (HCC) 11/21/2018  . Irritable bowel syndrome with diarrhea 11/21/2018  . Depression, major, single episode, mild (HCC) 11/21/2018  . Vitiligo 10/17/2018  . BMI 24.0-24.9, adult 10/17/2018  . Fournier's gangrene 01/08/2016  . Type 2 diabetes mellitus (HCC) 01/26/2013  . Acanthosis nigricans, acquired 01/26/2013  . Uncontrolled diabetes mellitus (HCC) 01/09/2013  . Hyperglycemia 01/09/2013  . Obesity 01/09/2013    Past Surgical History:  Procedure Laterality Date  . CATARACT EXTRACTION    . INCISION AND DRAINAGE PERIRECTAL ABSCESS N/A 01/07/2016   Procedure: INCISION AND DRAINAGE OF PERINEUM;  Surgeon: Axel Filler, MD;  Location: [facility] OR;  Service: General;  Laterality: N/A;  . IRRIGATION AND DEBRIDEMENT ABSCESS Bilateral  01/08/2016   Procedure: IRRIGATION AND DEBRIDEMENT GROIN;  Surgeon: Axel Filler, MD;  Location: [facility] OR;  Service: General;  Laterality: Bilateral;       Family History  Problem Relation Age of Onset  . Diabetes Mother   . Hypertension Mother   . Heart disease Mother   . Diabetes Father   . Arthritis Father   . Asthma Father   . Hypertension Father   . Heart disease Father   . Kidney disease Father   . Diabetes Sister   . Heart disease Paternal Grandfather   . Diabetes Paternal Grandfather     Social History   Tobacco Use  . Smoking status: Never Smoker  . Smokeless tobacco: Never Used  Vaping Use  . Vaping Use: Never used  Substance Use Topics  . Alcohol use: Yes    Comment: occ  . Drug use: No    Home Medications Prior to Admission medications   Medication Sig Start Date End Date Taking? Authorizing Provider  insulin aspart (NOVOLOG FLEXPEN) 100 UNIT/ML FlexPen Inject 3 Units into the skin 3 (three) times daily with meals. And pen needles 1/day 05/22/20   Fayrene Helper, PA-C  Insulin Glargine [facility]) 100 UNIT/ML Inject 0.1 mLs (10 Units total) into the skin daily. 05/22/20   Fayrene Helper, PA-C  loperamide (IMODIUM) 2 MG capsule Take 1 capsule (2 mg total) by mouth 4 (four) times daily as needed for diarrhea or loose stools. 05/22/20   Fayrene Helper, PA-C  cholestyramine light (PREVALITE) 4 g packet Take 1 packet (4 g total) by mouth 2 (  two) times daily. Patient not taking: Reported on 05/22/2020 11/21/18 05/22/20  Sonny Masters, FNP  DULoxetine (CYMBALTA) 30 MG capsule Take 2 capsules (60 mg total) by mouth daily. Take 1 tablet once a day for 7 days and then increase to 2 tablets daily. Patient not taking: Reported on 05/22/2020 11/21/18 05/22/20  Sonny Masters, FNP  enalapril (VASOTEC) 2.5 MG tablet Take 1 tablet (2.5 mg total) by mouth daily. Patient not taking: Reported on 11/21/2018 10/19/18 05/22/20  Sonny Masters, FNP  omeprazole (PRILOSEC) 20 MG capsule Take  1 capsule (20 mg total) by mouth daily. Patient not taking: Reported on 05/22/2020 11/21/18 05/22/20  Sonny Masters, FNP    Allergies    Penicillins, Amoxicillin, and Penicillin g  Review of Systems   Review of Systems  Constitutional: Negative for chills, fatigue and fever.  HENT: Negative for congestion.   Eyes: Negative for visual disturbance.  Respiratory: Negative for cough, chest tightness, shortness of breath and wheezing.   Cardiovascular: Negative for chest pain, palpitations and leg swelling.  Gastrointestinal: Negative for abdominal pain, constipation, diarrhea, nausea and vomiting.  Genitourinary: Negative for dysuria, flank pain and frequency.  Musculoskeletal: Negative for back pain, neck pain and neck stiffness.  Skin: Positive for color change and wound. Negative for rash.  Neurological: Negative for dizziness, weakness, light-headedness and headaches.  Psychiatric/Behavioral: Negative for agitation.  All other systems reviewed and are negative.   Physical Exam Updated Vital Signs BP (!) 128/94 (BP Location: Left Arm)   Pulse (!) 112   Temp 98.2 F (36.8 C) (Oral)   Resp 20   Ht  (1.803 m)   Wt 91.7 kg   SpO2 100%   BMI 28.20 kg/m   Physical Exam Vitals and nursing note reviewed.  Constitutional:      General: He is not in acute distress.    Appearance: He is well-developed. He is not ill-appearing, toxic-appearing or diaphoretic.  HENT:     Head: Normocephalic and atraumatic.     Nose: No congestion or rhinorrhea.     Mouth/Throat:     Mouth: Mucous membranes are moist.     Pharynx: No oropharyngeal exudate or posterior oropharyngeal erythema.  Eyes:     Extraocular Movements: Extraocular movements intact.     Conjunctiva/sclera: Conjunctivae normal.     Pupils: Pupils are equal, round, and reactive to light.  Cardiovascular:     Rate and Rhythm: Normal rate and regular rhythm.     Heart sounds: No murmur heard.   Pulmonary:     Effort:  Pulmonary effort is normal. No respiratory distress.     Breath sounds: Normal breath sounds. No wheezing, rhonchi or rales.  Chest:     Chest wall: No tenderness.  Abdominal:     General: Abdomen is flat.     Palpations: Abdomen is soft.     Tenderness: There is no abdominal tenderness. There is no right CVA tenderness, left CVA tenderness, guarding or rebound.  Musculoskeletal:        General: Tenderness present.     Cervical back: Neck supple.     Right lower leg: No edema.     Left lower leg: No edema.  Feet:     Right foot:     Skin integrity: Blister and callus present.     Comments: Symmetric sensation in toes.  Intact pulses in DP and PT arteries of right foot.  Blistering on the underside and medial side of the right  great toe.  Normal capillary refill.  Normal strength.  No other tenderness present.  Mild surrounding erythema of the blister. Skin:    General: Skin is warm and dry.     Capillary Refill: Capillary refill takes less than 2 seconds.     Coloration: Skin is not pale.     Findings: Erythema present.  Neurological:     Mental Status: He is alert.     Sensory: No sensory deficit (at his reported neuropathy baseline).     Motor: No weakness.  Psychiatric:        Mood and Affect: Mood normal.            ED Results / Procedures / Treatments   Labs (all labs ordered are listed, but only abnormal results are displayed) Labs Reviewed  CBG MONITORING, ED - Abnormal; Notable for the following components:      Result Value   Glucose-Capillary 453 (*)    All other components within normal limits     EKG None  Radiology DG Toe Great Right  Result Date: 03/24/2021 CLINICAL DATA:  Uncontrolled diabetes with blister on toe with erythema. Diabetic foot ulcer. EXAM: RIGHT GREAT TOE COMPARISON:  None. FINDINGS: There is no evidence of fracture or dislocation. No erosion, periosteal reaction, bony destruction or abnormal bone density to suggest osteomyelitis.  There is focal soft tissue prominence medially. No radiopaque foreign body or tracking soft tissue air. IMPRESSION: Soft tissue prominence medially. No evidence of osteomyelitis. No radiopaque foreign body or tracking soft tissue air. Electronically Signed   By: Narda Rutherford M.D.   On: 03/24/2021 16:15    Procedures Debridement  Date/Time: 03/24/2021 5:24 PM Performed by: Heide Scales, MD Authorized by: Heide Scales, MD  Consent: Verbal consent obtained. Risks and benefits: risks, benefits and alternatives were discussed Consent given by: patient Patient understanding: patient states understanding of the procedure being performed Patient identity confirmed: verbally with patient and hospital-assigned identification number Time out: Immediately prior to procedure a "time out" was called to verify the correct patient, procedure, equipment, support staff and site/side marked as required. Local anesthesia used: no  Anesthesia: Local anesthesia used: no  Sedation: Patient sedated: no  Patient tolerance: patient tolerated the procedure well with no immediate complications Comments: Very small area of dead tissue debrided from covering of blister.      Medications Ordered in ED Medications - No data to display  ED Course  I have reviewed the triage vital signs and the nursing notes.  Pertinent labs & imaging results that were available during my care of the patient were reviewed by me and considered in my medical decision making (see chart for details).    MDM Rules/Calculators/A&P                          SLATER MCMANAMAN is a 25 y.o. male with a past medical history significant for completely uncontrolled diabetes, asthma, seasonal allergies, prior Fournier's gangrene, hyperlipidemia, GERD, diabetic neuropathy, and depression who presents with right great toe blister and pain.  Patient reports that last week, he got a new pair of work boots and they were from  someone else and were size larger than he is used to.  He reports that today he noticed a blister on his right great toe that has ruptured and has some surrounding erythema and some mild discomfort.  He does report he has significant diabetic neuropathies at baseline and is  not sure when the blister started.  He reports that the outer layer has now peeled back and is partially falling off.  He reports the pain is mild.  He reports it is slightly red around it but denies any pain in the foot ankle or more proximally.  He denies any systemic symptoms including no fevers, chills, chest pain, shortness of breath, nausea, vomiting, confusion, diarrhea, urinary changes, cough.  He says that he has not checked his sugar in approximately 1 year and has not taken any insulin medication in a year.  He reports he does have some insulin medication at home.  On exam, patient does have a 2 cm callus/blister on his right great toe plantar/medial side that his opened.  There is some surrounding erythema but he has good capillary refill, symmetric sensation to other toes, and intact pulses.  He has normal strength in the foot and toes.  Otherwise, lungs clear, chest nontender, abdomen nontender.  Clinically I suspect this is from his new boots that caused wearing and rubbing and due to his neuropathy is caused a significant blister to develop.  With his uncontrolled diabetes, I am concerned about infection or deeper infection developing.  We agreed to get an x-ray to look for bony involvement or deep infection however if is negative, I do anticipate starting antibiotics to prevent worsening cellulitis given the surrounding erythema.  We did check his sugar and it was elevated at 453.  Patient reports that he thought it would be even higher as he has not taken insulin in nearly a year.  We had a long shared decision-making conversation about the seriousness of hyperglycemia being untreated but he would rather focus on the toe  and foot right now and not his other diabetic problems.  We offered blood work and a more systemic evaluation for problems associated with his uncontrolled diabetes but he does not want to focus on that.  He would rather get an x-ray, start antibiotics, and follow-up with podiatry/PCP.  Anticipate reassessment after imaging.  5:09 PM X-ray shows no evidence of osteomyelitis.  No foreign body seen or soft tissue air.  Suspect mild cellulitis with this blister. Had a long discussion with patient and he still only wants to focus on the foot.  He does not want other labs or management.  He reports he will follow-up with a PCP again and uses insulin at home.  He will follow up with PCP and podiatry for the foot.  We dressed him with a diabetic dressing and I did trim the clear insensate dead skin tissue off of the wound.  Patient will remain off his feet and follow-up with the appropriate teams.  Patient discharged in stable condition with understanding of return precautions and follow-up.   Final Clinical Impression(s) / ED Diagnoses Final diagnoses:  Blister of great toe of right foot, initial encounter  Cellulitis in diabetic foot (HCC)    Rx / DC Orders ED Discharge Orders         Ordered    clindamycin (CLEOCIN) 150 MG capsule  3 times daily        03/24/21 1715          Clinical Impression: 1. Blister of great toe of right foot, initial encounter   2. Cellulitis in diabetic foot (HCC)     Disposition: Discharge  Condition: Good  I have discussed the results, Dx and Tx plan with the pt(& family if present). He/she/they expressed understanding and agree(s) with the  plan. Discharge instructions discussed at great length. Strict return precautions discussed and pt &/or family have verbalized understanding of the instructions. No further questions at time of discharge.    New Prescriptions   CLINDAMYCIN (CLEOCIN) 150 MG CAPSULE    Take 3 capsules (450 mg total) by mouth 3 (three)  times daily for 14 days.    Follow Up: Trey SailorsPa, [facility] [address] [place] [id number] [phone]     [facility] [address] [place] [id number] [phone] Schedule an appointment as soon as possible for a visit    Logan BoresEvans, Larena GlassmanBrent M, DPM [address] [place] [address] [phone]   with podiatry     Yannet Rincon, Canary Brimhristopher J, MD 03/24/21 1725

## 2021-03-24 NOTE — ED Triage Notes (Addendum)
Pt c/o pain to right great toe from work boots-denies known injury-NAD-steady gait

## 2021-07-02 ENCOUNTER — Emergency Department (HOSPITAL_BASED_OUTPATIENT_CLINIC_OR_DEPARTMENT_OTHER): Payer: 59

## 2021-07-02 ENCOUNTER — Other Ambulatory Visit: Payer: Self-pay

## 2021-07-02 ENCOUNTER — Encounter (HOSPITAL_BASED_OUTPATIENT_CLINIC_OR_DEPARTMENT_OTHER): Payer: Self-pay

## 2021-07-02 ENCOUNTER — Inpatient Hospital Stay (HOSPITAL_BASED_OUTPATIENT_CLINIC_OR_DEPARTMENT_OTHER)
Admission: EM | Admit: 2021-07-02 | Discharge: 2021-07-04 | DRG: 872 | Disposition: A | Discharge status: Home / Self Care | Payer: 59 | Attending: Internal Medicine | Admitting: Internal Medicine

## 2021-07-02 DIAGNOSIS — L089 Local infection of the skin and subcutaneous tissue, unspecified: Secondary | ICD-10-CM

## 2021-07-02 DIAGNOSIS — E1165 Type 2 diabetes mellitus with hyperglycemia: Secondary | ICD-10-CM | POA: Diagnosis present

## 2021-07-02 DIAGNOSIS — Z794 Long term (current) use of insulin: Secondary | ICD-10-CM

## 2021-07-02 DIAGNOSIS — Z841 Family history of disorders of kidney and ureter: Secondary | ICD-10-CM

## 2021-07-02 DIAGNOSIS — L03031 Cellulitis of right toe: Secondary | ICD-10-CM | POA: Diagnosis present

## 2021-07-02 DIAGNOSIS — Z825 Family history of asthma and other chronic lower respiratory diseases: Secondary | ICD-10-CM

## 2021-07-02 DIAGNOSIS — A419 Sepsis, unspecified organism: Principal | ICD-10-CM | POA: Diagnosis present

## 2021-07-02 DIAGNOSIS — Z8249 Family history of ischemic heart disease and other diseases of the circulatory system: Secondary | ICD-10-CM

## 2021-07-02 DIAGNOSIS — Z880 Allergy status to penicillin: Secondary | ICD-10-CM

## 2021-07-02 DIAGNOSIS — Z833 Family history of diabetes mellitus: Secondary | ICD-10-CM

## 2021-07-02 DIAGNOSIS — L0390 Cellulitis, unspecified: Secondary | ICD-10-CM | POA: Diagnosis present

## 2021-07-02 DIAGNOSIS — E11628 Type 2 diabetes mellitus with other skin complications: Secondary | ICD-10-CM | POA: Diagnosis present

## 2021-07-02 DIAGNOSIS — E1169 Type 2 diabetes mellitus with other specified complication: Secondary | ICD-10-CM | POA: Diagnosis present

## 2021-07-02 DIAGNOSIS — E1142 Type 2 diabetes mellitus with diabetic polyneuropathy: Secondary | ICD-10-CM | POA: Diagnosis present

## 2021-07-02 DIAGNOSIS — Z79899 Other long term (current) drug therapy: Secondary | ICD-10-CM

## 2021-07-02 DIAGNOSIS — Z9114 Patient's other noncompliance with medication regimen: Secondary | ICD-10-CM

## 2021-07-02 DIAGNOSIS — E1141 Type 2 diabetes mellitus with diabetic mononeuropathy: Secondary | ICD-10-CM | POA: Diagnosis present

## 2021-07-02 DIAGNOSIS — Z20822 Contact with and (suspected) exposure to covid-19: Secondary | ICD-10-CM | POA: Diagnosis present

## 2021-07-02 DIAGNOSIS — E785 Hyperlipidemia, unspecified: Secondary | ICD-10-CM | POA: Diagnosis present

## 2021-07-02 DIAGNOSIS — Z8261 Family history of arthritis: Secondary | ICD-10-CM

## 2021-07-02 DIAGNOSIS — K219 Gastro-esophageal reflux disease without esophagitis: Secondary | ICD-10-CM | POA: Diagnosis present

## 2021-07-02 LAB — COMPREHENSIVE METABOLIC PANEL
ALT: 35 U/L (ref 0–44)
AST: 30 U/L (ref 15–41)
Albumin: 4.2 g/dL (ref 3.5–5.0)
Alkaline Phosphatase: 75 U/L (ref 38–126)
Anion gap: 11 (ref 5–15)
BUN: 17 mg/dL (ref 6–20)
CO2: 26 mmol/L (ref 22–32)
Calcium: 9.2 mg/dL (ref 8.9–10.3)
Chloride: 95 mmol/L — ABNORMAL LOW (ref 98–111)
Creatinine, Ser: 0.98 mg/dL (ref 0.61–1.24)
GFR, Estimated: >60 mL/min (ref >60)
Glucose, Bld: 379 mg/dL — ABNORMAL HIGH (ref 70–99)
Potassium: 3.5 mmol/L (ref 3.5–5.1)
Sodium: 132 mmol/L — ABNORMAL LOW (ref 135–145)
Total Bilirubin: 1 mg/dL (ref 0.3–1.2)
Total Protein: 7.6 g/dL (ref 6.5–8.1)

## 2021-07-02 LAB — CBC WITH DIFFERENTIAL/PLATELET
Abs Immature Granulocytes: 0.04 10*3/uL (ref 0.00–0.07)
Basophils Absolute: 0 10*3/uL (ref 0.0–0.1)
Basophils Relative: 0 %
Eosinophils Absolute: 0 10*3/uL (ref 0.0–0.5)
Eosinophils Relative: 0 %
HCT: 43.5 % (ref 39.0–52.0)
Hemoglobin: 15.1 g/dL (ref 13.0–17.0)
Immature Granulocytes: 0 %
Lymphocytes Relative: 21 %
Lymphs Abs: 2.6 10*3/uL (ref 0.7–4.0)
MCH: 27.2 pg (ref 26.0–34.0)
MCHC: 34.7 g/dL (ref 30.0–36.0)
MCV: 78.2 fL — ABNORMAL LOW (ref 80.0–100.0)
Monocytes Absolute: 1.3 10*3/uL — ABNORMAL HIGH (ref 0.1–1.0)
Monocytes Relative: 10 %
Neutro Abs: 8.5 10*3/uL — ABNORMAL HIGH (ref 1.7–7.7)
Neutrophils Relative %: 69 %
Platelets: 183 10*3/uL (ref 150–400)
RBC: 5.56 MIL/uL (ref 4.22–5.81)
RDW: 12.4 % (ref 11.5–15.5)
WBC: 12.4 10*3/uL — ABNORMAL HIGH (ref 4.0–10.5)
nRBC: 0 % (ref 0.0–0.2)

## 2021-07-02 LAB — LACTIC ACID, PLASMA: Lactic Acid, Venous: 1.5 mmol/L (ref 0.5–1.9)

## 2021-07-02 LAB — RESP PANEL BY RT-PCR (FLU A&B, COVID) ARPGX2
Influenza A by PCR: NEGATIVE
Influenza B by PCR: NEGATIVE
SARS Coronavirus 2 by RT PCR: NEGATIVE

## 2021-07-02 LAB — CBG MONITORING, ED: Glucose-Capillary: 352 mg/dL — ABNORMAL HIGH (ref 70–99)

## 2021-07-02 LAB — PROTIME-INR
INR: 1 (ref 0.8–1.2)
Prothrombin Time: 12.9 seconds (ref 11.4–15.2)

## 2021-07-02 MED ORDER — VANCOMYCIN HCL IN DEXTROSE 1-5 GM/200ML-% IV SOLN
1000.0000 mg | Freq: Once | INTRAVENOUS | Status: AC
  Administered 2021-07-02: 1000 mg via INTRAVENOUS
  Filled 2021-07-02: qty 200

## 2021-07-02 MED ORDER — VANCOMYCIN HCL IN DEXTROSE 1-5 GM/200ML-% IV SOLN: 1000.0000 mg | Freq: Once | INTRAVENOUS | Status: DC

## 2021-07-02 MED ORDER — LACTATED RINGERS IV BOLUS
1000.0000 mL | Freq: Once | INTRAVENOUS | Status: AC
  Administered 2021-07-02: 1000 mL via INTRAVENOUS

## 2021-07-02 MED ORDER — SODIUM CHLORIDE 0.9 % IV SOLN
2.0000 g | Freq: Three times a day (TID) | INTRAVENOUS | Status: DC
  Administered 2021-07-03 – 2021-07-04 (×4): 2 g via INTRAVENOUS
  Filled 2021-07-02 (×4): qty 2

## 2021-07-02 MED ORDER — SODIUM CHLORIDE 0.9 % IV SOLN
2.0000 g | Freq: Once | INTRAVENOUS | Status: AC
  Administered 2021-07-02: 2 g via INTRAVENOUS

## 2021-07-02 MED ORDER — ACETAMINOPHEN 325 MG PO TABS
650.0000 mg | ORAL_TABLET | Freq: Once | ORAL | Status: AC
  Administered 2021-07-02: 650 mg via ORAL
  Filled 2021-07-02: qty 2

## 2021-07-02 MED ORDER — VANCOMYCIN HCL 750 MG/150ML IV SOLN
750.0000 mg | Freq: Three times a day (TID) | INTRAVENOUS | Status: DC
  Administered 2021-07-03 – 2021-07-04 (×4): 750 mg via INTRAVENOUS
  Filled 2021-07-02 (×6): qty 150

## 2021-07-02 NOTE — ED Provider Notes (Signed)
[facility] EMERGENCY DEPARTMENT Provider Note   CSN: [id number] Arrival date & time: 07/02/21  1948     History Chief Complaint  Patient presents with   Toe Injury    Shawn Harmon is a 25 y.o. male.  HPI Patient presents with worry for toe infection.  States recently started a new job.  Has ulcer and sloughing of skin on his right great toe.  States there is also some involvement of the third toe.  He is diabetic.  States he had felt as if his sugars were going low.  Upon arrival however temperature 102.3.  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  Also states there is another lesion on his foot where he stepped on a thumbtack a couple weeks ago.    Past Medical History:  Diagnosis Date   Asthma    Cataract 2017   Diabetes mellitus (HCC)    Hemoglobin A1c greater than 9.0%    Seasonal allergies    seasonal    Patient Active Problem List   Diagnosis Date Noted   Microalbuminuria due to type 2 diabetes mellitus (HCC) 11/21/2018   Hyperlipidemia associated with type 2 diabetes mellitus (HCC) 11/21/2018   Gastroesophageal reflux disease without esophagitis 11/21/2018   Diabetic mononeuropathy associated with type 2 diabetes mellitus (HCC) 11/21/2018   Irritable bowel syndrome with diarrhea 11/21/2018   Depression, major, single episode, mild (HCC) 11/21/2018   Vitiligo 10/17/2018   BMI 24.0-24.9, adult 10/17/2018   Fournier's gangrene 01/08/2016   Type 2 diabetes mellitus (HCC) 01/26/2013   Acanthosis nigricans, acquired 01/26/2013   Uncontrolled diabetes mellitus (HCC) 01/09/2013   Hyperglycemia 01/09/2013   Obesity 01/09/2013    Past Surgical History:  Procedure Laterality Date   CATARACT EXTRACTION     INCISION AND DRAINAGE PERIRECTAL ABSCESS N/A 01/07/2016   Procedure: INCISION AND DRAINAGE OF PERINEUM;  Surgeon: Axel Filler, MD;  Location: [facility] OR;  Service: General;  Laterality: N/A;   IRRIGATION AND DEBRIDEMENT ABSCESS Bilateral  01/08/2016   Procedure: IRRIGATION AND DEBRIDEMENT GROIN;  Surgeon: Axel Filler, MD;  Location: [facility] OR;  Service: General;  Laterality: Bilateral;       Family History  Problem Relation Age of Onset   Diabetes Mother    Hypertension Mother    Heart disease Mother    Diabetes Father    Arthritis Father    Asthma Father    Hypertension Father    Heart disease Father    Kidney disease Father    Diabetes Sister    Heart disease Paternal Grandfather    Diabetes Paternal Grandfather     Social History   Tobacco Use   Smoking status: Never   Smokeless tobacco: Never  Vaping Use   Vaping Use: Never used  Substance Use Topics   Alcohol use: Yes    Comment: occ   Drug use: No    Home Medications Prior to Admission medications   Medication Sig Start Date End Date Taking? Authorizing Provider  insulin aspart (NOVOLOG FLEXPEN) 100 UNIT/ML FlexPen Inject 3 Units into the skin 3 (three) times daily with meals. And pen needles 1/day 05/22/20   Fayrene Helper, PA-C  Insulin Glargine [facility]) 100 UNIT/ML Inject 0.1 mLs (10 Units total) into the skin daily. 05/22/20   Fayrene Helper, PA-C  loperamide (IMODIUM) 2 MG capsule Take 1 capsule (2 mg total) by mouth 4 (four) times daily as needed for diarrhea or loose stools. 05/22/20   Laveda Norman,  Greta Doom, PA-C  cholestyramine light (PREVALITE) 4 g packet Take 1 packet (4 g total) by mouth 2 (two) times daily. Patient not taking: Reported on 05/22/2020 11/21/18 05/22/20  Sonny Masters, FNP  DULoxetine (CYMBALTA) 30 MG capsule Take 2 capsules (60 mg total) by mouth daily. Take 1 tablet once a day for 7 days and then increase to 2 tablets daily. Patient not taking: Reported on 05/22/2020 11/21/18 05/22/20  Sonny Masters, FNP  enalapril (VASOTEC) 2.5 MG tablet Take 1 tablet (2.5 mg total) by mouth daily. Patient not taking: Reported on 11/21/2018 10/19/18 05/22/20  Sonny Masters, FNP  omeprazole (PRILOSEC) 20 MG capsule Take 1 capsule (20 mg total) by  mouth daily. Patient not taking: Reported on 05/22/2020 11/21/18 05/22/20  Sonny Masters, FNP    Allergies    Penicillins, Amoxicillin, and Penicillin g  Review of Systems   Review of Systems  Constitutional:  Positive for fever. Negative for appetite change.  HENT:  Negative for congestion.   Respiratory:  Negative for shortness of breath.   Cardiovascular:  Negative for chest pain.  Gastrointestinal:  Negative for abdominal pain.  Genitourinary:  Negative for flank pain.  Musculoskeletal:  Negative for back pain.  Skin:  Positive for wound. Negative for pallor.  Neurological:  Negative for weakness.  Psychiatric/Behavioral:  Negative for confusion.    Physical Exam Updated Vital Signs BP 125/80 (BP Location: Right Arm)   Pulse (!) 102   Temp 98.6 F (37 C) (Oral)   Resp 18   SpO2 93%   Physical Exam Vitals and nursing note reviewed.  HENT:     Head: Normocephalic.  Eyes:     Pupils: Pupils are equal, round, and reactive to light.  Cardiovascular:     Rate and Rhythm: Tachycardia present.  Pulmonary:     Breath sounds: No wheezing or rhonchi.  Abdominal:     Tenderness: There is no abdominal tenderness.  Musculoskeletal:        General: Tenderness present.     Comments: Large area of sloughed skin like from a blister on his right great toe.  Proximal to this there is a smaller rounded ulcer with may have some purulence.  Also some erythema on toe and on third toe.  Little tracking up of the erythema near the toe.  Skin:    General: Skin is warm.     Capillary Refill: Capillary refill takes less than 2 seconds.  Neurological:     Mental Status: He is alert and oriented to person, place, and time.       ED Results / Procedures / Treatments   Labs (all labs ordered are listed, but only abnormal results are displayed) Labs Reviewed  COMPREHENSIVE METABOLIC PANEL - Abnormal; Notable for the following components:      Result Value   Sodium 132 (*)    Chloride 95  (*)    Glucose, Bld 379 (*)    All other components within normal limits  CBC WITH DIFFERENTIAL/PLATELET - Abnormal; Notable for the following components:   WBC 12.4 (*)    MCV 78.2 (*)    Neutro Abs 8.5 (*)    Monocytes Absolute 1.3 (*)    All other components within normal limits  CBG MONITORING, ED - Abnormal; Notable for the following components:   Glucose-Capillary 352 (*)    All other components within normal limits  RESP PANEL BY RT-PCR (FLU A&B, COVID) ARPGX2  CULTURE, BLOOD (ROUTINE X 2)  CULTURE,  BLOOD (ROUTINE X 2)  LACTIC ACID, PLASMA  PROTIME-INR  LACTIC ACID, PLASMA  URINALYSIS, ROUTINE W REFLEX MICROSCOPIC    EKG None  Radiology DG Chest 2 View  Result Date: 07/02/2021 CLINICAL DATA:  Diabetic wound to the right great toe. Febrile and tachycardia. EXAM: CHEST - 2 VIEW COMPARISON:  01/09/2016 FINDINGS: The heart size and mediastinal contours are within normal limits. Both lungs are clear. The visualized skeletal structures are unremarkable. IMPRESSION: No active cardiopulmonary disease. Electronically Signed   By: Burman NievesWilliam  Stevens M.D.   On: 07/02/2021 20:51   DG Foot Complete Right  Result Date: 07/02/2021 CLINICAL DATA:  Diabetic wound to the right great toe.  Febrile. EXAM: RIGHT FOOT COMPLETE - 3+ VIEW COMPARISON:  03/24/2021 FINDINGS: There is no evidence of fracture or dislocation. There is no evidence of arthropathy or other focal bone abnormality. Soft tissues are unremarkable. IMPRESSION: No acute bony abnormalities. No bone changes to suggest osteomyelitis. No radiopaque soft tissue foreign bodies. Electronically Signed   By: Burman NievesWilliam  Stevens M.D.   On: 07/02/2021 21:31    Procedures Procedures   Medications Ordered in ED Medications  aztreonam (AZACTAM) 2 g in sodium chloride 0.9 % 100 mL IVPB (2 g Intravenous New Bag/Given 07/02/21 2315)  vancomycin (VANCOCIN) IVPB 1000 mg/200 mL premix (has no administration in time range)    Followed by  vancomycin  (VANCOCIN) IVPB 1000 mg/200 mL premix (has no administration in time range)  vancomycin (VANCOREADY) IVPB 750 mg/150 mL (has no administration in time range)  ceFEPIme (MAXIPIME) 2 g in sodium chloride 0.9 % 100 mL IVPB (has no administration in time range)  acetaminophen (TYLENOL) tablet 650 mg (650 mg Oral Given 07/02/21 2035)  lactated ringers bolus 1,000 mL (0 mLs Intravenous Stopped 07/02/21 2241)  lactated ringers bolus 1,000 mL (0 mLs Intravenous Stopped 07/02/21 2241)    ED Course  I have reviewed the triage vital signs and the nursing notes.  Pertinent labs & imaging results that were available during my care of the patient were reviewed by me and considered in my medical decision making (see chart for details).    MDM Rules/Calculators/A&P                          Patient presents with fever and foot infection.  Patient is diabetic.  Did have a previous history of Fournier's gangrene.  Wound on great toe on the right side has been getting larger.  More erythema.  Also second puncture wound more proximally.  On the great toe looks more like a cellulitis potentially could be abscess or deeper infection on the puncture wound.  No purulent drainage however.  Initial temperature 102.3.  White count mildly elevated.  Initial heart rate 122.  Has come down with IV fluids.  Also has defervesced.  However with diabetes erythema and temperature 102 I feel the patient benefit from admission to hospital for further treatment.  Does have a anaphylaxis to penicillin.  Treated with antibiotics to cover purulent infection since the puncture wound potentially could be purulent although not necessarily seen a clean abscess.  Will discuss with hospitalist for admission.  CRITICAL CARE Performed by: Benjiman CoreNathan Malana Eberwein Total critical care time: 30 minutes Critical care time was exclusive of separately billable procedures and treating other patients. Critical care was necessary to treat or prevent imminent or  life-threatening deterioration. Critical care was time spent personally by me on the following activities: development of treatment plan  with patient and/or surrogate as well as nursing, discussions with consultants, evaluation of patient's response to treatment, examination of patient, obtaining history from patient or surrogate, ordering and performing treatments and interventions, ordering and review of laboratory studies, ordering and review of radiographic studies, pulse oximetry and re-evaluation of patient's condition.  Final Clinical Impression(s) / ED Diagnoses Final diagnoses:  Sepsis without acute organ dysfunction, due to unspecified organism [facility])  Diabetic foot infection [facility])    Rx / DC Orders ED Discharge Orders     None        Benjiman Core, MD 07/02/21 2337

## 2021-07-02 NOTE — Progress Notes (Signed)
Pharmacy Antibiotic Note  Shawn Harmon is a 25 y.o. male admitted on 07/02/2021 presenting with DFI and concern for sepsis.  Pharmacy has been consulted for vancomycin and aztreonam dosing dosing.  Pcn allergy updated in 2020 to itching as rxn, low risk and low cross sensitivity with 4th gen cephalosporin cefepime  Plan: Vancomycin 2000 mg IV x 1, then 750 mg IV q 8 hours (eAUC 470, Goal AUC 400-550, SCr 0.98) Cefepime 2g IV every 8 hours Monitor renal function, Cx and clinical progression to narrow Vancomycin levels as indicated     Temp (24hrs), Avg:102.3 F (39.1 C), Min:102.3 F (39.1 C), Max:102.3 F (39.1 C)  Recent Labs  Lab 07/02/21 2031  WBC 12.4*  CREATININE 0.98  LATICACIDVEN 1.5    CrCl cannot be calculated (Unknown ideal weight.).    Allergies  Allergen Reactions   Penicillins Anaphylaxis and Hives   Amoxicillin Itching   Penicillin G Itching    Shawn Harmon, PharmD Clinical Pharmacist ED Pharmacist Phone # [phone] 07/02/2021 11:19 PM

## 2021-07-02 NOTE — ED Triage Notes (Signed)
Diabetic wound to right great toe. States his work boot caused laceration to toe. Febrile and tachy in triage.

## 2021-07-02 NOTE — ED Notes (Signed)
Toe dressed with xeroform and gauze.

## 2021-07-02 NOTE — ED Notes (Signed)
ED Provider at bedside. 

## 2021-07-03 ENCOUNTER — Encounter (HOSPITAL_COMMUNITY): Payer: Self-pay | Admitting: Internal Medicine

## 2021-07-03 DIAGNOSIS — E1141 Type 2 diabetes mellitus with diabetic mononeuropathy: Secondary | ICD-10-CM | POA: Diagnosis present

## 2021-07-03 DIAGNOSIS — A419 Sepsis, unspecified organism: Principal | ICD-10-CM

## 2021-07-03 DIAGNOSIS — Z794 Long term (current) use of insulin: Secondary | ICD-10-CM | POA: Diagnosis not present

## 2021-07-03 DIAGNOSIS — Z79899 Other long term (current) drug therapy: Secondary | ICD-10-CM | POA: Diagnosis not present

## 2021-07-03 DIAGNOSIS — K219 Gastro-esophageal reflux disease without esophagitis: Secondary | ICD-10-CM | POA: Diagnosis present

## 2021-07-03 DIAGNOSIS — Z8261 Family history of arthritis: Secondary | ICD-10-CM | POA: Diagnosis not present

## 2021-07-03 DIAGNOSIS — Z20822 Contact with and (suspected) exposure to covid-19: Secondary | ICD-10-CM | POA: Diagnosis present

## 2021-07-03 DIAGNOSIS — E1169 Type 2 diabetes mellitus with other specified complication: Secondary | ICD-10-CM | POA: Diagnosis present

## 2021-07-03 DIAGNOSIS — Z841 Family history of disorders of kidney and ureter: Secondary | ICD-10-CM | POA: Diagnosis not present

## 2021-07-03 DIAGNOSIS — Z8249 Family history of ischemic heart disease and other diseases of the circulatory system: Secondary | ICD-10-CM | POA: Diagnosis not present

## 2021-07-03 DIAGNOSIS — Z9114 Patient's other noncompliance with medication regimen: Secondary | ICD-10-CM | POA: Diagnosis not present

## 2021-07-03 DIAGNOSIS — E785 Hyperlipidemia, unspecified: Secondary | ICD-10-CM | POA: Diagnosis present

## 2021-07-03 DIAGNOSIS — Z833 Family history of diabetes mellitus: Secondary | ICD-10-CM | POA: Diagnosis not present

## 2021-07-03 DIAGNOSIS — E1165 Type 2 diabetes mellitus with hyperglycemia: Secondary | ICD-10-CM | POA: Diagnosis present

## 2021-07-03 DIAGNOSIS — L0390 Cellulitis, unspecified: Secondary | ICD-10-CM | POA: Diagnosis present

## 2021-07-03 DIAGNOSIS — L089 Local infection of the skin and subcutaneous tissue, unspecified: Secondary | ICD-10-CM | POA: Diagnosis not present

## 2021-07-03 DIAGNOSIS — Z880 Allergy status to penicillin: Secondary | ICD-10-CM | POA: Diagnosis not present

## 2021-07-03 DIAGNOSIS — Z825 Family history of asthma and other chronic lower respiratory diseases: Secondary | ICD-10-CM | POA: Diagnosis not present

## 2021-07-03 DIAGNOSIS — E11628 Type 2 diabetes mellitus with other skin complications: Secondary | ICD-10-CM | POA: Diagnosis present

## 2021-07-03 DIAGNOSIS — E1142 Type 2 diabetes mellitus with diabetic polyneuropathy: Secondary | ICD-10-CM | POA: Diagnosis present

## 2021-07-03 DIAGNOSIS — L03031 Cellulitis of right toe: Secondary | ICD-10-CM | POA: Diagnosis present

## 2021-07-03 LAB — URINALYSIS, ROUTINE W REFLEX MICROSCOPIC
Bacteria, UA: NONE SEEN
Bilirubin Urine: NEGATIVE
Glucose, UA: >=500 mg/dL — AB
Hgb urine dipstick: NEGATIVE
Ketones, ur: 80 mg/dL — AB
Leukocytes,Ua: NEGATIVE
Nitrite: NEGATIVE
Protein, ur: NEGATIVE mg/dL
Specific Gravity, Urine: 1.03 (ref 1.005–1.030)
pH: 6 (ref 5.0–8.0)

## 2021-07-03 LAB — LACTIC ACID, PLASMA: Lactic Acid, Venous: 1.1 mmol/L (ref 0.5–1.9)

## 2021-07-03 LAB — CBC
HCT: 39.2 % (ref 39.0–52.0)
Hemoglobin: 13.3 g/dL (ref 13.0–17.0)
MCH: 26.7 pg (ref 26.0–34.0)
MCHC: 33.9 g/dL (ref 30.0–36.0)
MCV: 78.7 fL — ABNORMAL LOW (ref 80.0–100.0)
Platelets: 157 10*3/uL (ref 150–400)
RBC: 4.98 MIL/uL (ref 4.22–5.81)
RDW: 12.6 % (ref 11.5–15.5)
WBC: 8.9 10*3/uL (ref 4.0–10.5)
nRBC: 0 % (ref 0.0–0.2)

## 2021-07-03 LAB — LIPID PANEL
Cholesterol: 145 mg/dL (ref 0–200)
HDL: 32 mg/dL — ABNORMAL LOW (ref >40)
LDL Cholesterol: 43 mg/dL (ref 0–99)
Total CHOL/HDL Ratio: 4.5 RATIO
Triglycerides: 351 mg/dL — ABNORMAL HIGH (ref <150)
VLDL: 70 mg/dL — ABNORMAL HIGH (ref 0–40)

## 2021-07-03 LAB — HEMOGLOBIN A1C
Hgb A1c MFr Bld: 12.6 % — ABNORMAL HIGH (ref 4.8–5.6)
Mean Plasma Glucose: 314.92 mg/dL

## 2021-07-03 LAB — MRSA NEXT GEN BY PCR, NASAL: MRSA by PCR Next Gen: NOT DETECTED

## 2021-07-03 LAB — BASIC METABOLIC PANEL
Anion gap: 8 (ref 5–15)
BUN: 11 mg/dL (ref 6–20)
CO2: 26 mmol/L (ref 22–32)
Calcium: 8.6 mg/dL — ABNORMAL LOW (ref 8.9–10.3)
Chloride: 98 mmol/L (ref 98–111)
Creatinine, Ser: 0.75 mg/dL (ref 0.61–1.24)
GFR, Estimated: >60 mL/min (ref >60)
Glucose, Bld: 363 mg/dL — ABNORMAL HIGH (ref 70–99)
Potassium: 3.7 mmol/L (ref 3.5–5.1)
Sodium: 132 mmol/L — ABNORMAL LOW (ref 135–145)

## 2021-07-03 LAB — GLUCOSE, CAPILLARY
Glucose-Capillary: 351 mg/dL — ABNORMAL HIGH (ref 70–99)
Glucose-Capillary: 326 mg/dL — ABNORMAL HIGH (ref 70–99)
Glucose-Capillary: 305 mg/dL — ABNORMAL HIGH (ref 70–99)
Glucose-Capillary: 267 mg/dL — ABNORMAL HIGH (ref 70–99)
Glucose-Capillary: 253 mg/dL — ABNORMAL HIGH (ref 70–99)

## 2021-07-03 LAB — HIV ANTIBODY (ROUTINE TESTING W REFLEX): HIV Screen 4th Generation wRfx: NONREACTIVE

## 2021-07-03 LAB — PHOSPHORUS: Phosphorus: 3.4 mg/dL (ref 2.5–4.6)

## 2021-07-03 LAB — MAGNESIUM: Magnesium: 1.5 mg/dL — ABNORMAL LOW (ref 1.7–2.4)

## 2021-07-03 MED ORDER — LACTATED RINGERS IV SOLN: INTRAVENOUS | Status: DC

## 2021-07-03 MED ORDER — INSULIN GLARGINE 100 UNIT/ML ~~LOC~~ SOLN
7.0000 [IU] | Freq: Two times a day (BID) | SUBCUTANEOUS | Status: DC
  Administered 2021-07-03 – 2021-07-04 (×3): 7 [IU] via SUBCUTANEOUS
  Filled 2021-07-03 (×4): qty 0.07

## 2021-07-03 MED ORDER — ENOXAPARIN SODIUM 40 MG/0.4ML IJ SOSY
40.0000 mg | PREFILLED_SYRINGE | Freq: Every day | INTRAMUSCULAR | Status: DC
  Administered 2021-07-03 – 2021-07-04 (×2): 40 mg via SUBCUTANEOUS
  Filled 2021-07-03 (×2): qty 0.4

## 2021-07-03 MED ORDER — INSULIN ASPART 100 UNIT/ML IJ SOLN
0.0000 [IU] | Freq: Three times a day (TID) | INTRAMUSCULAR | Status: DC
  Administered 2021-07-03: 5 [IU] via SUBCUTANEOUS
  Administered 2021-07-03 (×2): 7 [IU] via SUBCUTANEOUS
  Administered 2021-07-04: 3 [IU] via SUBCUTANEOUS

## 2021-07-03 MED ORDER — MAGNESIUM SULFATE 2 GM/50ML IV SOLN
2.0000 g | Freq: Once | INTRAVENOUS | Status: AC
  Administered 2021-07-03: 2 g via INTRAVENOUS
  Filled 2021-07-03: qty 50

## 2021-07-03 MED ORDER — INSULIN ASPART 100 UNIT/ML IJ SOLN
0.0000 [IU] | Freq: Every day | INTRAMUSCULAR | Status: DC
  Administered 2021-07-03: 3 [IU] via SUBCUTANEOUS

## 2021-07-03 MED ORDER — LOPERAMIDE HCL 2 MG PO CAPS: 2.0000 mg | ORAL_CAPSULE | Freq: Four times a day (QID) | ORAL | Status: DC | PRN

## 2021-07-03 NOTE — H&P (Addendum)
History and Physical  Shawn Harmon SWN:[id number] DOB: 11/02/1996 DOA: 07/02/2021  Referring physician:  Accepted as a direct transfer from [facility] to [facility] by Dr. Toniann Fail, [facility]. PCP: [facility]  Outpatient Specialists: Endocrinology Patient coming from: Home.    Chief Complaint: Right first toe wound.  HPI: Shawn Harmon is a 25 y.o. male with medical history significant for type 2 diabetes, diabetic polyneuropathy, vitiligo, who initially presented to [facility] due to new onset right first toe wound a few hours prior to presentation.  States that it was not present the day before.  Patient has minimal feelings to his feet.  He works at a [employer] and uses heavy boots.  He presented to the ED for further evaluation.  In the ED he was febrile with a T-max of 102.3.  He was started on IV antibiotics empirically.  Right foot x-ray revealed no acute bony abnormalities.  No bony changes to suggest osteomyelitis.  No radiopaque soft tissue foreign bodies.  TRH, hospitalist team, was asked to admit.  Patient was accepted by Dr. Toniann Fail as a direct admission from [facility] to [facility].  ED Course:  T-max 102.3.  BP 130/89, pulse 101, respiratory rate 16, O2 saturation 98% on room air.  Lab studies remarkable for serum sodium 132, potassium 3.5, serum bicarb 26, glucose 379, BUN 17, creatinine 0.98, anion gap 11, magnesium 1.5, AST 30, ALT 35, total bilirubin 1.0, GFR greater than 60.  WBC 12.4, hemoglobin 15.1, MCV 78, platelet 183.  Review of Systems: Review of systems as noted in the HPI. All other systems reviewed and are negative.   Past Medical History:  Diagnosis Date   Asthma    Cataract 2017   Diabetes mellitus (HCC)    Hemoglobin A1c greater than 9.0%    Seasonal allergies    seasonal   Past Surgical History:  Procedure Laterality Date   CATARACT EXTRACTION     INCISION AND DRAINAGE PERIRECTAL ABSCESS N/A 01/07/2016   Procedure: INCISION AND DRAINAGE OF PERINEUM;   Surgeon: Axel Filler, MD;  Location: [facility] OR;  Service: General;  Laterality: N/A;   IRRIGATION AND DEBRIDEMENT ABSCESS Bilateral 01/08/2016   Procedure: IRRIGATION AND DEBRIDEMENT GROIN;  Surgeon: Axel Filler, MD;  Location: [facility] OR;  Service: General;  Laterality: Bilateral;    Social History:  reports that he has never smoked. He has never used smokeless tobacco. He reports current alcohol use. He reports that he does not use drugs.   Allergies  Allergen Reactions   Penicillins Anaphylaxis and Hives   Amoxicillin Itching   Penicillin G Itching    Family History  Problem Relation Age of Onset   Diabetes Mother    Hypertension Mother    Heart disease Mother    Diabetes Father    Arthritis Father    Asthma Father    Hypertension Father    Heart disease Father    Kidney disease Father    Diabetes Sister    Heart disease Paternal Grandfather    Diabetes Paternal Grandfather       Prior to Admission medications   Medication Sig Start Date End Date Taking? Authorizing Provider  insulin aspart (NOVOLOG FLEXPEN) 100 UNIT/ML FlexPen Inject 3 Units into the skin 3 (three) times daily with meals. And pen needles 1/day 05/22/20   Fayrene Helper, PA-C  Insulin Glargine [facility]) 100 UNIT/ML Inject 0.1 mLs (10 Units total) into the skin daily. 05/22/20   Fayrene Helper, PA-C  loperamide (IMODIUM)  2 MG capsule Take 1 capsule (2 mg total) by mouth 4 (four) times daily as needed for diarrhea or loose stools. 05/22/20   Fayrene Helper, PA-C  cholestyramine light (PREVALITE) 4 g packet Take 1 packet (4 g total) by mouth 2 (two) times daily. Patient not taking: Reported on 05/22/2020 11/21/18 05/22/20  Sonny Masters, FNP  DULoxetine (CYMBALTA) 30 MG capsule Take 2 capsules (60 mg total) by mouth daily. Take 1 tablet once a day for 7 days and then increase to 2 tablets daily. Patient not taking: Reported on 05/22/2020 11/21/18 05/22/20  Sonny Masters, FNP  enalapril (VASOTEC) 2.5 MG tablet Take 1  tablet (2.5 mg total) by mouth daily. Patient not taking: Reported on 11/21/2018 10/19/18 05/22/20  Sonny Masters, FNP  omeprazole (PRILOSEC) 20 MG capsule Take 1 capsule (20 mg total) by mouth daily. Patient not taking: Reported on 05/22/2020 11/21/18 05/22/20  Sonny Masters, FNP    Physical Exam: BP 130/89 (BP Location: Right Arm)   Pulse (!) 101   Temp 98.4 F (36.9 C) (Oral)   Resp 16   SpO2 98%   General: 25 y.o. year-old male well developed well nourished in no acute distress.  Alert and oriented x3. Cardiovascular: Regular rate and rhythm with no rubs or gallops.  No thyromegaly or JVD noted.  No lower extremity edema. 2/4 pulses in all 4 extremities. Respiratory: Clear to auscultation with no wheezes or rales. Good inspiratory effort. Abdomen: Soft nontender nondistended with normal bowel sounds x4 quadrants. Muskuloskeletal: No cyanosis, clubbing or edema noted bilaterally Neuro: CN II-XII intact, strength, sensation, reflexes Skin:  Right great toe 07/03/21.   Right great toe 07/03/21. Psychiatry: Judgement and insight appear normal. Mood is appropriate for condition and setting          Labs on Admission:  Basic Metabolic Panel: Recent Labs  Lab 07/02/21 2031  NA 132*  K 3.5  CL 95*  CO2 26  GLUCOSE 379*  BUN 17  CREATININE 0.98  CALCIUM 9.2   Liver Function Tests: Recent Labs  Lab 07/02/21 2031  AST 30  ALT 35  ALKPHOS 75  BILITOT 1.0  PROT 7.6  ALBUMIN 4.2   No results for input(s): LIPASE, AMYLASE in the last 168 hours. No results for input(s): AMMONIA in the last 168 hours. CBC: Recent Labs  Lab 07/02/21 2031  WBC 12.4*  NEUTROABS 8.5*  HGB 15.1  HCT 43.5  MCV 78.2*  PLT 183   Cardiac Enzymes: No results for input(s): CKTOTAL, CKMB, CKMBINDEX, TROPONINI in the last 168 hours.  BNP (last 3 results) No results for input(s): BNP in the last 8760 hours.  ProBNP (last 3 results) No results for input(s): PROBNP in the last 8760  hours.  CBG: Recent Labs  Lab 07/02/21 2018  GLUCAP 352*    Radiological Exams on Admission: DG Chest 2 View  Result Date: 07/02/2021 CLINICAL DATA:  Diabetic wound to the right great toe. Febrile and tachycardia. EXAM: CHEST - 2 VIEW COMPARISON:  01/09/2016 FINDINGS: The heart size and mediastinal contours are within normal limits. Both lungs are clear. The visualized skeletal structures are unremarkable. IMPRESSION: No active cardiopulmonary disease. Electronically Signed   By: Burman Nieves M.D.   On: 07/02/2021 20:51   DG Foot Complete Right  Result Date: 07/02/2021 CLINICAL DATA:  Diabetic wound to the right great toe.  Febrile. EXAM: RIGHT FOOT COMPLETE - 3+ VIEW COMPARISON:  03/24/2021 FINDINGS: There is no evidence of fracture or  dislocation. There is no evidence of arthropathy or other focal bone abnormality. Soft tissues are unremarkable. IMPRESSION: No acute bony abnormalities. No bone changes to suggest osteomyelitis. No radiopaque soft tissue foreign bodies. Electronically Signed   By: Burman Nieves M.D.   On: 07/02/2021 21:31    EKG: I independently viewed the EKG done and my findings are as followed: None available at the time of this visit.  Assessment/Plan Present on Admission:  Cellulitis  Active Problems:   Cellulitis  Sepsis secondary to right great toe cellulitis/wound, POA Presented with T-max 102.3, pulse 128, respiration rate 28, right great toe wound Follow blood cultures x2 Started on IV antibiotics empirically Continue IV cefepime and IV vancomycin for now Obtain MRSA screening test Continue IV fluid hydration Monitor fever curve and WBC Wound care specialist consulted Continue local wound care  Uncontrolled type 2 diabetes with hyperglycemia Obtain hemoglobin A1c Start Lantus 7 unit twice daily Start insulin sliding scale Obtain fasting lipid panel Diabetes coordinator consulted  Hypomagnesemia Presented with serum magnesium 1.5 Repleted  intravenously. Repeat magnesium level in the morning   DVT prophylaxis: Subcu Lovenox daily  Code Status: Full code.  Family Communication: None at bedside.  Disposition Plan: Admitted to telemetry medical unit.  Consults called: None.  Admission status: Inpatient status.  The patient will require at least 2 midnights for further evaluation and treatment of present condition.   Status is: Inpatient    Dispo:  Patient From: Home  Planned Disposition: Home, possibly on 07/05/2021  Medically stable for discharge: No         Darlin Drop MD [facility] Pager [phone]  If 7PM-7AM, please contact night-coverage [email or web address] Password [facility]  07/03/2021, 2:33 AM

## 2021-07-03 NOTE — Consult Note (Signed)
WOC Nurse Consult Note: Patient receiving care in [facility] [phone] Consult completed remotely after review of chart and images.  Reason for Consult:Foot wound Wound type:Torn skin from right great toe. Calloused area to the ball of the right foot that is white the brown center.  Pressure Injury POA: NA Measurement: Deferred Periwound: intact Dressing procedure/placement/frequency: If skin on right great toe can be folded back in place then do so and then wrap the toe with Xeroform gauze and secure with 1" Kerlix wrap. Apply a small piece of Xeroform gauze to the ball of the right foot and secure with dry gauze and wrap the foot with Kerlix. Change dressings daily.  Monitor the wound area(s) for worsening of condition such as: Signs/symptoms of infection, increase in size, development of or worsening of odor, development of pain, or increased pain at the affected locations.   Notify the medical team if any of these develop.  Thank you for the consult. WOC nurse will not follow at this time.   Please re-consult the WOC team if needed.  Renaldo Reel Katrinka Blazing, MSN, RN, CMSRN, Angus Seller, [facility] Wound Treatment Associate Pager [phone]

## 2021-07-03 NOTE — Progress Notes (Addendum)
No charge note  Patient seen and examined this morning, admitted overnight, agree with the assessment and plan.  In brief, this is a 25 year old male with insulin-dependent type 2 diabetes mellitus noncompliant with his insulin or sugar checks, diabetic polyneuropathy comes in with right first toe wound, fever and chills.  Wound appeared rather sudden after day at work wearing steel toe boots, appears to be a blister that popped open.  He had the same thing few months back at the same toe, also due to steel boots, and came to urgent care but was not admitted  Sepsis due to right great toe cellulitis -sepsis physiology improving, afebrile this morning and WBC coming down.  Continue intravenous antibiotics for another 24 hours and if he remains afebrile will transition to orals and sent home tomorrow  Uncontrolled type 2 diabetes mellitus, with hyperglycemia-A1c 12.6, patient reports not taking his insulin at home nor checking his sugars.  He does not have a glucometer.  He scheduled to see his endocrinologist shortly but I will give him his prescriptions on discharge.  He also reports he has a problem with sugary drinks and cannot fully abide by diabetic diet  Shawn Harmon M. Shawn Lennox, MD, PhD [facility]  Between 7 am - 7 pm you can contact me via Amion (for emergencies) or Securechat (non urgent matters).  I am not available 7 pm - 7 am, please contact night coverage MD/APP via Amion

## 2021-07-03 NOTE — ED Notes (Signed)
Report given to Matt with [facility] °

## 2021-07-03 NOTE — Progress Notes (Addendum)
Inpatient Diabetes Program Recommendations  AACE/ADA: New Consensus Statement on Inpatient Glycemic Control (2015)  Target Ranges:  Prepandial:   less than 140 mg/dL      Peak postprandial:   less than 180 mg/dL (1-2 hours)      Critically ill patients:  140 - 180 mg/dL   Results for Shawn Harmon, Shawn Harmon (MRN [id number]) as of 07/03/2021 09:10  Ref. Range 07/02/2021 20:18 07/03/2021 02:33 07/03/2021 08:30  Glucose-Capillary Latest Ref Range: 70 - 99 mg/dL 352 (H) 351 (H)    7 units LANTUS $RemoveBefo'@6'nQSTxfiEnMj$ :29am 326 (H)  7 units NOVOLOG $RemoveBefor'@9'vhyKGbuQmWTj$ :13am   Results for Shawn Harmon, Shawn Harmon (MRN [id number]) as of 07/03/2021 09:10  Ref. Range 04/02/2020 11:25 07/03/2021 03:35  Hemoglobin A1C Latest Ref Range: 4.8 - 5.6 % 14.0 (A) 12.6 (H)  (314 mg/dl)     Admit with: Sepsis secondary to right great toe cellulitis/wound, POA  History: DM  Home DM Meds:Basaglar 10 units Daily       Novolog 3 units TID with meals  Current Orders: Novolog Sensitive Correction Scale/ SSI (0-9 units) TID AC + HS     Lantus 7 units BID    Endocrinologist: Dr. Loanne Drilling Last Seen 04/02/2020  Note Lantus and Novolog both started this AM  Current A1c of 12.6% shows very poor glucose control at home--Of note, A1c was 14% back in April 2021 when he last saw his ENDO  Plan to speak with pt today regarding his home diabetes care   Addendum 11:15am--Met w/ pt at bedside this AM.  Pt told me he has been taking Novolog with meals (usually twice per day) but has NOT been taking his basal insulin.  No Basal Insulin in over 1 year--Rx ran out and he never bothered to follow up with his ENDO and gets refills taken care of.  Told me he has been using his father's Novolog (states his father passed away and he just started using his insulin).  Is unsure if the Novolog he has been using in in date.  Guesses at how much Novolog to take and has not been taking Novolog with Lunch break at his job (only taking BID at home).  Pt told me he was diagnosed with diabetes in  2015 and was prescribed Metformin, Lantus, and Novolog.  Stopped Lantus a few years after diagnosis but had to restart and then quit taking the Lantus over 1 year ago when he ran out.  Has Insurance coverage thru his Mom's plan.  Does not have CBG meter at home and hasn't checked CBGs in a long time.  I asked pt why he didn't go buy a CBG meter OTC at [facility] and he stated he couldn't afford one.  I did re-emphasize with pt that the [employer] branded meters are only $10 and additional strips are very affordable as well.  Encouraged pt to get a CBG meter after d/c.  Pt told me that the doctor (Dr. Cruzita Lederer) told him he would refill his Lantus and Novolog Rxs and would give him a Rx for a meter as well.  Pt states he has an appt with Dr. Loanne Drilling (ENDO) on 7/18.  Strongly encouraged pt to check his CBGs at least TID [facility] at home prior to this appt and to take his new refilled Rxs with his to his next ENDO appt.  Reminded pt that good glucose control is imperative for healing for his foot and prevention of complications of diabetes.  Reviewed goal CBGs and goal A1c for home.  Pt did not have any additional questions for me at this time.  Question if pt fully understands the seriousness of his diabetes?    --Will follow patient during hospitalization--  Wyn Quaker RN, MSN, CDE Diabetes Coordinator Inpatient Glycemic Control Team Team Pager: [phone] (8a-5p)

## 2021-07-04 DIAGNOSIS — E11628 Type 2 diabetes mellitus with other skin complications: Secondary | ICD-10-CM

## 2021-07-04 DIAGNOSIS — L089 Local infection of the skin and subcutaneous tissue, unspecified: Secondary | ICD-10-CM

## 2021-07-04 LAB — BASIC METABOLIC PANEL
Anion gap: 4 — ABNORMAL LOW (ref 5–15)
BUN: 13 mg/dL (ref 6–20)
CO2: 27 mmol/L (ref 22–32)
Calcium: 8.6 mg/dL — ABNORMAL LOW (ref 8.9–10.3)
Chloride: 103 mmol/L (ref 98–111)
Creatinine, Ser: 0.71 mg/dL (ref 0.61–1.24)
GFR, Estimated: >60 mL/min (ref >60)
Glucose, Bld: 221 mg/dL — ABNORMAL HIGH (ref 70–99)
Potassium: 3.7 mmol/L (ref 3.5–5.1)
Sodium: 134 mmol/L — ABNORMAL LOW (ref 135–145)

## 2021-07-04 LAB — CBC
HCT: 38.2 % — ABNORMAL LOW (ref 39.0–52.0)
Hemoglobin: 12.9 g/dL — ABNORMAL LOW (ref 13.0–17.0)
MCH: 26.8 pg (ref 26.0–34.0)
MCHC: 33.8 g/dL (ref 30.0–36.0)
MCV: 79.4 fL — ABNORMAL LOW (ref 80.0–100.0)
Platelets: 160 10*3/uL (ref 150–400)
RBC: 4.81 MIL/uL (ref 4.22–5.81)
RDW: 12.5 % (ref 11.5–15.5)
WBC: 6.4 10*3/uL (ref 4.0–10.5)
nRBC: 0 % (ref 0.0–0.2)

## 2021-07-04 LAB — PHOSPHORUS: Phosphorus: 3.1 mg/dL (ref 2.5–4.6)

## 2021-07-04 LAB — GLUCOSE, CAPILLARY: Glucose-Capillary: 224 mg/dL — ABNORMAL HIGH (ref 70–99)

## 2021-07-04 LAB — MAGNESIUM: Magnesium: 1.9 mg/dL (ref 1.7–2.4)

## 2021-07-04 MED ORDER — BLOOD GLUCOSE MONITOR KIT: PACK | 0 refills | Status: AC

## 2021-07-04 MED ORDER — CLINDAMYCIN HCL 300 MG PO CAPS: 300.0000 mg | ORAL_CAPSULE | Freq: Three times a day (TID) | ORAL | 0 refills | Status: AC

## 2021-07-04 MED ORDER — BASAGLAR KWIKPEN 100 UNIT/ML ~~LOC~~ SOPN: 10.0000 [IU] | PEN_INJECTOR | Freq: Every day | SUBCUTANEOUS | 1 refills | Status: DC

## 2021-07-04 MED ORDER — NOVOLOG FLEXPEN 100 UNIT/ML ~~LOC~~ SOPN: 3.0000 [IU] | PEN_INJECTOR | Freq: Three times a day (TID) | SUBCUTANEOUS | 1 refills | Status: DC

## 2021-07-04 MED ORDER — "PEN NEEDLES 5/16"" 31G X 8 MM MISC": 1.0000 | Freq: Four times a day (QID) | 1 refills | Status: AC

## 2021-07-04 MED ORDER — DOXYCYCLINE HYCLATE 100 MG PO CAPS: 100.0000 mg | ORAL_CAPSULE | Freq: Two times a day (BID) | ORAL | 0 refills | Status: AC

## 2021-07-04 NOTE — Discharge Summary (Signed)
Physician Discharge Summary  Shawn KIENER XBM:[id number] DOB: 1996-06-21 DOA: 07/02/2021  PCP: [facility]  Admit date: 07/02/2021 Discharge date: 07/04/2021  Admitted From: home Disposition:  home  Recommendations for Outpatient Follow-up:  Follow up with PCP in 1-2 weeks  Home Health: none Equipment/Devices: none  Discharge Condition: stable CODE STATUS: Full code Diet recommendation: carb modified  HPI: Per admitting MD, Shawn Harmon is a 25 y.o. male with medical history significant for type 2 diabetes, diabetic polyneuropathy, vitiligo, who initially presented to [facility] due to new onset right first toe wound a few hours prior to presentation.  States that it was not present the day before.  Patient has minimal feelings to his feet.  He works at a [employer] and uses heavy boots.  He presented to the ED for further evaluation.  In the ED he was febrile with a T-max of 102.3.  He was started on IV antibiotics empirically.  Right foot x-ray revealed no acute bony abnormalities.  No bony changes to suggest osteomyelitis.  No radiopaque soft tissue foreign bodies.  TRH, hospitalist team, was asked to admit.  Patient was accepted by Dr. Hal Hope as a direct admission from [facility] to [name].  Hospital Course / Discharge diagnoses: Principal problem Sepsis due to right great toe cellulitis -patient was admitted to the hospital with a popped blister and surrounding cellulitis after wearing steel toe boots at work.  He has significant neuropathy due to his diabetes and could not feel the lesion up until it was too late.  He was initially maintained on IV antibiotics, sepsis physiology resolved, leukocytosis normalized, he is afebrile and will be discharged home in stable condition on oral antibiotics.  Active problems Uncontrolled type 2 diabetes mellitus, with hyperglycemia-A1c 12.6, patient reports not taking his insulin at home nor checking his sugars.  He does not have  a glucometer.  He scheduled to see his endocrinologist shortly but I will give him his prescriptions on discharge.  He also reports he has a problem with sugary drinks and cannot fully abide by diabetic diet.   Discharge Instructions  Allergies as of 07/04/2021       Reactions   Penicillins Anaphylaxis, Hives   Amoxicillin Itching   Penicillin G Itching        Medication List     TAKE these medications    Basaglar KwikPen 100 UNIT/ML Inject 10 Units into the skin daily.   blood glucose meter kit and supplies Kit Dispense based on patient and insurance preference. Use up to four times daily as directed.   clindamycin 300 MG capsule Commonly known as: CLEOCIN Take 1 capsule (300 mg total) by mouth 3 (three) times daily for 5 days.   doxycycline 100 MG capsule Commonly known as: VIBRAMYCIN Take 1 capsule (100 mg total) by mouth 2 (two) times daily for 5 days.   loperamide 2 MG capsule Commonly known as: IMODIUM Take 1 capsule (2 mg total) by mouth 4 (four) times daily as needed for diarrhea or loose stools.   naproxen 250 MG tablet Commonly known as: NAPROSYN Take 1,000 mg by mouth daily as needed for mild pain.   NovoLOG FlexPen 100 UNIT/ML FlexPen Generic drug: insulin aspart Inject 3 Units into the skin 3 (three) times daily with meals. And pen needles 1/day What changed:  how much to take additional instructions   PEN NEEDLES 31GX5/16" 31G X 8 MM Misc 1 each by Does not apply route 4 (four)  times daily.   PROBIOTIC ACIDOPHILUS PO Take 1 capsule by mouth daily.         Consultations: None   Procedures/Studies: None   DG Chest 2 View  Result Date: 07/02/2021 CLINICAL DATA:  Diabetic wound to the right great toe. Febrile and tachycardia. EXAM: CHEST - 2 VIEW COMPARISON:  01/09/2016 FINDINGS: The heart size and mediastinal contours are within normal limits. Both lungs are clear. The visualized skeletal structures are unremarkable. IMPRESSION: No active  cardiopulmonary disease. Electronically Signed   By: Lucienne Capers M.D.   On: 07/02/2021 20:51   DG Foot Complete Right  Result Date: 07/02/2021 CLINICAL DATA:  Diabetic wound to the right great toe.  Febrile. EXAM: RIGHT FOOT COMPLETE - 3+ VIEW COMPARISON:  03/24/2021 FINDINGS: There is no evidence of fracture or dislocation. There is no evidence of arthropathy or other focal bone abnormality. Soft tissues are unremarkable. IMPRESSION: No acute bony abnormalities. No bone changes to suggest osteomyelitis. No radiopaque soft tissue foreign bodies. Electronically Signed   By: Lucienne Capers M.D.   On: 07/02/2021 21:31     Subjective: - no chest pain, shortness of breath, no abdominal pain, nausea or vomiting.   Discharge Exam: BP 121/85 (BP Location: Left Arm)   Pulse 85   Temp 98 F (36.7 C) (Oral)   Resp 17   SpO2 93%   General: Pt is alert, awake, not in acute distress Cardiovascular: RRR, S1/S2 +, no rubs, no gallops Respiratory: CTA bilaterally, no wheezing, no rhonchi Abdominal: Soft, NT, ND, bowel sounds + Extremities: no edema, no cyanosis   The results of significant diagnostics from this hospitalization (including imaging, microbiology, ancillary and laboratory) are listed below for reference.     Microbiology: Recent Results (from the past 240 hour(s))  Culture, blood (Routine x 2)     Status: None (Preliminary result)   Collection Time: 07/02/21  8:31 PM   Specimen: BLOOD  Result Value Ref Range Status   Specimen Description   Final    BLOOD LEFT ANTECUBITAL Performed at [facility], [address] [place], [place] [address]    Special Requests   Final    BOTTLES DRAWN AEROBIC AND ANAEROBIC Blood Culture adequate volume Performed at [facility], [address], [place], [place] [address]    Culture   Final    NO GROWTH 1 DAY Performed at [facility], [place] [address] [place], [place] [address]    Report Status PENDING   Incomplete  Culture, blood (Routine x 2)     Status: None (Preliminary result)   Collection Time: 07/02/21  9:02 PM   Specimen: BLOOD  Result Value Ref Range Status   Specimen Description   Final    BLOOD BOTTLES DRAWN AEROBIC AND ANAEROBIC Performed at [facility], [address] [place], [place] [address]    Special Requests   Final    Blood Culture adequate volume BLOOD LEFT HAND Performed at [facility], [address] [place], [place] [address]    Culture   Final    NO GROWTH < 24 HOURS Performed at [facility], [place] [address], [place], [place] [address]    Report Status PENDING  Incomplete  Resp Panel by RT-PCR (Flu A&B, Covid) Nasopharyngeal Swab     Status: None   Collection Time: 07/02/21  9:11 PM   Specimen: Nasopharyngeal Swab; Nasopharyngeal(NP) swabs in vial transport medium  Result Value Ref Range Status   SARS Coronavirus 2 by RT PCR NEGATIVE NEGATIVE Final    Comment: (NOTE) SARS-CoV-2 target nucleic acids are NOT DETECTED.  The SARS-CoV-2 RNA is generally detectable in upper respiratory specimens during the acute phase of infection. The lowest concentration of SARS-CoV-2 viral copies this assay can detect is 138 copies/mL. A negative result does not preclude SARS-Cov-2 infection and should not be used as the sole basis for treatment or other patient management decisions. A negative result may occur with  improper specimen collection/handling, submission of specimen other than nasopharyngeal swab, presence of viral mutation(s) within the areas targeted by this assay, and inadequate number of viral copies(<138 copies/mL). A negative result must be combined with clinical observations, patient history, and epidemiological information. The expected result is Negative.  Fact Sheet for Patients:  EntrepreneurPulse.com.au  Fact Sheet for Healthcare Providers:  IncredibleEmployment.be  This test  is no t yet approved or cleared by the [place] FDA and  has been authorized for detection and/or diagnosis of SARS-CoV-2 by FDA under an Emergency Use Authorization (EUA). This EUA will remain  in effect (meaning this test can be used) for the duration of the COVID-19 declaration under Section 564(b)(1) of the Act, 21 U.S.C.section 360bbb-3(b)(1), unless the authorization is terminated  or revoked sooner.       Influenza A by PCR NEGATIVE NEGATIVE Final   Influenza B by PCR NEGATIVE NEGATIVE Final    Comment: (NOTE) The Xpert Xpress SARS-CoV-2/FLU/RSV plus assay is intended as an aid in the diagnosis of influenza from Nasopharyngeal swab specimens and should not be used as a sole basis for treatment. Nasal washings and aspirates are unacceptable for Xpert Xpress SARS-CoV-2/FLU/RSV testing.  Fact Sheet for Patients: EntrepreneurPulse.com.au  Fact Sheet for Healthcare Providers: IncredibleEmployment.be  This test is not yet approved or cleared by the [place] FDA and has been authorized for detection and/or diagnosis of SARS-CoV-2 by FDA under an Emergency Use Authorization (EUA). This EUA will remain in effect (meaning this test can be used) for the duration of the COVID-19 declaration under Section 564(b)(1) of the Act, 21 U.S.C. section 360bbb-3(b)(1), unless the authorization is terminated or revoked.  Performed at [facility], [address] [place], [place] [address]   MRSA Next Gen by PCR, Nasal     Status: None   Collection Time: 07/03/21  6:04 AM   Specimen: Nasal Mucosa; Nasal Swab  Result Value Ref Range Status   MRSA by PCR Next Gen NOT DETECTED NOT DETECTED Final    Comment: (NOTE) The GeneXpert MRSA Assay (FDA approved for NASAL specimens only), is one component of a comprehensive MRSA colonization surveillance program. It is not intended to diagnose MRSA infection nor to guide or monitor treatment for  MRSA infections. Test performance is not FDA approved in patients less than 36 years old. Performed at [facility], [place] [address] [place], [place] [address]      Labs: Basic Metabolic Panel: Recent Labs  Lab 07/02/21 2031 07/03/21 0335 07/04/21 0138  NA 132* 132* 134*  K 3.5 3.7 3.7  CL 95* 98 103  CO2 _0 GLUCOSE 379* 363* 221*  BUN _1 CREATININE 0.98 0.75 0.71  CALCIUM 9.2 8.6* 8.6*  MG  --  1.5* 1.9  PHOS  --  3.4 3.1   Liver Function Tests: Recent Labs  Lab 07/02/21 2031  AST 30  ALT 35  ALKPHOS 75  BILITOT 1.0  PROT 7.6  ALBUMIN 4.2   CBC: Recent Labs  Lab 07/02/21 2031 07/03/21 0335 07/04/21 0138  WBC 12.4* 8.9 6.4  NEUTROABS 8.5*  --   --   HGB 15.1 13.3 12.9*  HCT 43.5 39.2 38.2*  MCV 78.2* 78.7* 79.4*  PLT 183 157 160   CBG: Recent Labs  Lab 07/03/21 0830 07/03/21 1130 07/03/21 1604 07/03/21 2046 07/04/21 0613  GLUCAP 326* 305* 267* 253* 224*   Hgb A1c Recent Labs    07/03/21 0335  HGBA1C 12.6*   Lipid Profile Recent Labs    07/03/21 0500  CHOL 145  HDL 32*  LDLCALC 43  TRIG 351*  CHOLHDL 4.5   Thyroid function studies No results for input(s): TSH, T4TOTAL, T3FREE, THYROIDAB in the last 72 hours.  Invalid input(s): FREET3 Urinalysis    Component Value Date/Time   COLORURINE YELLOW 07/03/2021 [place] 07/03/2021 1707   LABSPEC 1.030 07/03/2021 1707   PHURINE 6.0 07/03/2021 1707   GLUCOSEU >=500 (A) 07/03/2021 1707   HGBUR NEGATIVE 07/03/2021 1707   BILIRUBINUR NEGATIVE 07/03/2021 1707   KETONESUR 80 (A) 07/03/2021 1707   PROTEINUR NEGATIVE 07/03/2021 1707   UROBILINOGEN 0.2 07/26/2013 1703   NITRITE NEGATIVE 07/03/2021 1707   LEUKOCYTESUR NEGATIVE 07/03/2021 1707    FURTHER DISCHARGE INSTRUCTIONS:   Get Medicines reviewed and adjusted: Please take all your medications with you for your next visit with your Primary MD   Laboratory/radiological data: Please request your  Primary MD to go over all hospital tests and procedure/radiological results at the follow up, please ask your Primary MD to get all Hospital records sent to his/her office.   In some cases, they will be blood work, cultures and biopsy results pending at the time of your discharge. Please request that your primary care M.D. goes through all the records of your hospital data and follows up on these results.   Also Note the following: If you experience worsening of your admission symptoms, develop shortness of breath, life threatening emergency, suicidal or homicidal thoughts you must seek medical attention immediately by calling 911 or calling your MD immediately  if symptoms less severe.   You must read complete instructions/literature along with all the possible adverse reactions/side effects for all the Medicines you take and that have been prescribed to you. Take any new Medicines after you have completely understood and accpet all the possible adverse reactions/side effects.    Do not drive when taking Pain medications or sleeping medications (Benzodaizepines)   Do not take more than prescribed Pain, Sleep and Anxiety Medications. It is not advisable to combine anxiety,sleep and pain medications without talking with your primary care practitioner   Special Instructions: If you have smoked or chewed Tobacco  in the last 2 yrs please stop smoking, stop any regular Alcohol  and or any Recreational drug use.   Wear Seat belts while driving.   Please note: You were cared for by a hospitalist during your hospital stay. Once you are discharged, your primary care physician will handle any further medical issues. Please note that NO REFILLS for any discharge medications will be authorized once you are discharged, as it is imperative that you return to your primary care physician (or establish a relationship with a primary care physician if you do not have one) for your post hospital discharge needs so  that they can reassess your need for medications and monitor your lab values.  Time coordinating discharge: 35 minutes  SIGNED:  Marzetta Board, MD, PhD 07/04/2021, 9:14 AM

## 2021-07-04 NOTE — Plan of Care (Signed)

## 2021-07-08 LAB — CULTURE, BLOOD (ROUTINE X 2)
Culture: NO GROWTH
Culture: NO GROWTH
Special Requests: ADEQUATE
Special Requests: ADEQUATE

## 2021-07-13 ENCOUNTER — Ambulatory Visit (INDEPENDENT_AMBULATORY_CARE_PROVIDER_SITE_OTHER): Payer: 59 | Admitting: Endocrinology

## 2021-07-13 ENCOUNTER — Other Ambulatory Visit: Payer: Self-pay

## 2021-07-13 VITALS — BP 112/76 | HR 111 | Ht 71.0 in | Wt 208.0 lb

## 2021-07-13 DIAGNOSIS — R809 Proteinuria, unspecified: Secondary | ICD-10-CM

## 2021-07-13 DIAGNOSIS — L97519 Non-pressure chronic ulcer of other part of right foot with unspecified severity: Secondary | ICD-10-CM | POA: Diagnosis not present

## 2021-07-13 DIAGNOSIS — E1129 Type 2 diabetes mellitus with other diabetic kidney complication: Secondary | ICD-10-CM | POA: Diagnosis not present

## 2021-07-13 DIAGNOSIS — L97509 Non-pressure chronic ulcer of other part of unspecified foot with unspecified severity: Secondary | ICD-10-CM | POA: Insufficient documentation

## 2021-07-13 LAB — POCT GLYCOSYLATED HEMOGLOBIN (HGB A1C): Hemoglobin A1C: 12 % — AB (ref 4.0–5.6)

## 2021-07-13 MED ORDER — NOVOLOG FLEXPEN 100 UNIT/ML ~~LOC~~ SOPN: 10.0000 [IU] | PEN_INJECTOR | Freq: Three times a day (TID) | SUBCUTANEOUS | 1 refills | Status: DC

## 2021-07-13 MED ORDER — LANTUS SOLOSTAR 100 UNIT/ML ~~LOC~~ SOPN: 30.0000 [IU] | PEN_INJECTOR | Freq: Every morning | SUBCUTANEOUS | 99 refills | Status: DC

## 2021-07-13 NOTE — Patient Instructions (Addendum)
Please schedule an appointment with a new primary care provider at [place].  Please see a wound specialist.  you will receive a phone call, about a day and time for an appointment.  I have sent a prescription to your pharmacy, to add Lantus, 30 units each morning. Please reduce Novolog to 10 units 3 times a day (just before each meal).  check your blood sugar 4 times a day: before the 3 meals, and at bedtime.  also check if you have symptoms of your blood sugar being too high or too low.  please keep a record of the readings and bring it to your next appointment here (or you can bring the meter itself).  You can write it on any piece of paper.  please call us sooner if your blood sugar goes below 70, or if most of your readings are over 200.  Please come back for a follow-up appointment in 1 month.

## 2021-07-13 NOTE — Progress Notes (Signed)
Subjective:    Patient ID: Shawn Harmon, male    DOB: 01-Nov-1996, 25 y.o.   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  Meter is downloaded today, and the printout is scanned into the record.  He now takes Novolog only.  Meter is downloaded today, and the printout is scanned into the record.  Cbg varies from 80-300.  There is no trend throughout the day.   Past Medical History:  Diagnosis Date   Asthma    Cataract 2017   Diabetes mellitus (HCC)    Hemoglobin A1c greater than 9.0%    Seasonal allergies    seasonal    Past Surgical History:  Procedure Laterality Date   CATARACT EXTRACTION     INCISION AND DRAINAGE PERIRECTAL ABSCESS N/A 01/07/2016   Procedure: INCISION AND DRAINAGE OF PERINEUM;  Surgeon: Ralene Ok, MD;  Location: [facility];  Service: General;  Laterality: N/A;   IRRIGATION AND DEBRIDEMENT ABSCESS Bilateral 01/08/2016   Procedure: IRRIGATION AND DEBRIDEMENT GROIN;  Surgeon: Ralene Ok, MD;  Location: [facility];  Service: General;  Laterality: Bilateral;    Social History   Socioeconomic History   Marital status: Single    Spouse name: Not on file   Number of children: Not on file   Years of education: Not on file   Highest education level: Not on file  Occupational History   Not on file  Tobacco Use   Smoking status: Never   Smokeless tobacco: Never  Vaping Use   Vaping Use: Never used  Substance and Sexual Activity   Alcohol use: Yes    Comment: occ   Drug use: No   Sexual activity: Not on file  Other  Topics Concern   Not on file  Social History Narrative   Not on file   Social Determinants of Health   Financial Resource Strain: Not on file  Food Insecurity: Not on file  Transportation Needs: Not on file  Physical Activity: Not on file  Stress: Not on file  Social Connections: Not on file  Intimate Partner Violence: Not on file    Current Outpatient Medications on File Prior to Visit  Medication Sig Dispense Refill   blood glucose meter kit and supplies KIT Dispense based on patient and insurance preference. Use up to four times daily as directed. 1 each 0   Insulin Pen Needle (PEN NEEDLES 31GX5/16") 31G X 8 MM MISC 1 each by Does not apply route 4 (four) times daily. 100 each 1   Lactobacillus (PROBIOTIC ACIDOPHILUS PO) Take 1 capsule by mouth daily.     loperamide (IMODIUM) 2 MG capsule Take 1 capsule (2 mg total) by mouth 4 (four) times daily as needed for diarrhea or loose stools. 12 capsule 0   naproxen (NAPROSYN) 250 MG tablet Take 1,000 mg by mouth daily as needed for mild pain.     [DISCONTINUED] cholestyramine light (PREVALITE) 4 g packet Take 1 packet (4 g total) by mouth 2 (two) times daily. (Patient not taking: Reported on 05/22/2020) 60 packet 3   [DISCONTINUED] DULoxetine (CYMBALTA) 30  MG capsule Take 2 capsules (60 mg total) by mouth daily. Take 1 tablet once a day for 7 days and then increase to 2 tablets daily. (Patient not taking: Reported on 05/22/2020) 60 capsule 3   [DISCONTINUED] enalapril (VASOTEC) 2.5 MG tablet Take 1 tablet (2.5 mg total) by mouth daily. (Patient not taking: Reported on 11/21/2018) 30 tablet 3   [DISCONTINUED] omeprazole (PRILOSEC) 20 MG capsule Take 1 capsule (20 mg total) by mouth daily. (Patient not taking: Reported on 05/22/2020) 30 capsule 3   No current facility-administered medications on file prior to visit.    Allergies  Allergen Reactions   Penicillins Anaphylaxis and Hives   Amoxicillin Itching   Penicillin G Itching    Family  History  Problem Relation Age of Onset   Diabetes Mother    Hypertension Mother    Heart disease Mother    Diabetes Father    Arthritis Father    Asthma Father    Hypertension Father    Heart disease Father    Kidney disease Father    Diabetes Sister    Heart disease Paternal Grandfather    Diabetes Paternal Grandfather     BP 112/76 (BP Location: Left Arm, Patient Position: Sitting, Cuff Size: Normal)   Pulse (!) 111   Ht $R'5\' 11"'sr$  (1.803 m)   Wt 208 lb (94.3 kg)   SpO2 98%   BMI 29.01 kg/m   Review of Systems He denies hypoglycemia and fever.      Objective:   Physical Exam Pulses: dorsalis pedis intact bilat.   MSK: no deformity of the feet CV: no leg edema Skin:  ulcer on the right great toe is healing.  normal color and temp on the feet.  Neuro: sensation is intact to touch on the feet, but decreased from normal.    Lab Results  Component Value Date   CREATININE 0.71 07/04/2021   BUN 13 07/04/2021   NA 134 (L) 07/04/2021   K 3.7 07/04/2021   CL 103 07/04/2021   CO2 27 07/04/2021    Lab Results  Component Value Date   HGBA1C 12.0 (A) 07/13/2021       Assessment & Plan:  DM: uncontrolled.  Toe ulcer, improving.   Patient Instructions  Please schedule an appointment with a new primary care provider at [place].  Please see a wound specialist.  you will receive a phone call, about a day and time for an appointment.  I have sent a prescription to your pharmacy, to add Lantus, 30 units each morning. Please reduce Novolog to 10 units 3 times a day (just before each meal).  check your blood sugar 4 times a day: before the 3 meals, and at bedtime.  also check if you have symptoms of your blood sugar being too high or too low.  please keep a record of the readings and bring it to your next appointment here (or you can bring the meter itself).  You can write it on any piece of paper.  please call us sooner if your blood sugar goes below 70, or if most  of your readings are over 200.  Please come back for a follow-up appointment in 1 month.

## 2021-08-13 ENCOUNTER — Ambulatory Visit (INDEPENDENT_AMBULATORY_CARE_PROVIDER_SITE_OTHER): Payer: 59 | Admitting: Endocrinology

## 2021-08-13 ENCOUNTER — Other Ambulatory Visit: Payer: Self-pay

## 2021-08-13 VITALS — BP 120/84 | HR 104 | Ht 71.0 in | Wt 211.2 lb

## 2021-08-13 DIAGNOSIS — E11621 Type 2 diabetes mellitus with foot ulcer: Secondary | ICD-10-CM | POA: Diagnosis not present

## 2021-08-13 DIAGNOSIS — L97519 Non-pressure chronic ulcer of other part of right foot with unspecified severity: Secondary | ICD-10-CM

## 2021-08-13 MED ORDER — EMPAGLIFLOZIN 10 MG PO TABS: 10.0000 mg | ORAL_TABLET | Freq: Every day | ORAL | 3 refills | Status: AC

## 2021-08-13 MED ORDER — NOVOLOG FLEXPEN 100 UNIT/ML ~~LOC~~ SOPN: 8.0000 [IU] | PEN_INJECTOR | Freq: Three times a day (TID) | SUBCUTANEOUS | 1 refills | Status: AC

## 2021-08-13 MED ORDER — LANTUS SOLOSTAR 100 UNIT/ML ~~LOC~~ SOPN: 40.0000 [IU] | PEN_INJECTOR | Freq: Every morning | SUBCUTANEOUS | 99 refills | Status: AC

## 2021-08-13 NOTE — Patient Instructions (Addendum)
Please schedule an appointment with a new primary care provider at the [place] [facility].  Please see a wound specialist.  you will receive a phone call, about a day and time for an appointment.  I have sent a prescription to your pharmacy, to increase the Lantus to 40 units each morning.   Please reduce Novolog to 8 units 3 times a day (just before each meal).   check your blood sugar 4 times a day: before the 3 meals, and at bedtime.  also check if you have symptoms of your blood sugar being too high or too low.  please keep a record of the readings and bring it to your next appointment here (or you can bring the meter itself).  You can write it on any piece of paper.  please call us sooner if your blood sugar goes below 70, or if most of your readings are over 200.   Please see a diabetes educator, to consider an insulin pump.   Please come back for a follow-up appointment in 6 weeks.

## 2021-08-13 NOTE — Progress Notes (Signed)
Subjective:    Patient ID: Shawn Harmon, male    DOB: 1996/07/22, 25 y.o.   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   Dx'ed: 5852 Complications: PN, foot ulcers, and GP Therapy: insulin intermittently since dx DKA: once, in 2017 Severe hypoglycemia: never Pancreatitis: never Pancreatic imaging: atrophic on 2017 CT SDOH: he has insurance just intermittently Other: he weighed 260 lbs at then time of dx.   Interval history: Meter is downloaded today, and the printout is scanned into the record.  Cbg varies from 75-339.  Pt says he sometimes misses the Novolog.  In particular, he missed the supper Novolog the day HS cbg was 339.  He wants to add jardiance. Past Medical History:  Diagnosis Date   Asthma    Cataract 2017   Diabetes mellitus (HCC)    Hemoglobin A1c greater than 9.0%    Seasonal allergies    seasonal    Past Surgical History:  Procedure Laterality Date   CATARACT EXTRACTION     INCISION AND DRAINAGE PERIRECTAL ABSCESS N/A 01/07/2016   Procedure: INCISION AND DRAINAGE OF PERINEUM;  Surgeon: Ralene Ok, MD;  Location: [facility];  Service: General;  Laterality: N/A;   IRRIGATION AND DEBRIDEMENT ABSCESS Bilateral 01/08/2016   Procedure: IRRIGATION AND DEBRIDEMENT GROIN;  Surgeon: Ralene Ok, MD;  Location: [facility];  Service: General;  Laterality: Bilateral;    Social History   Socioeconomic History   Marital status: Single    Spouse name: Not on file   Number of children: Not on file   Years of education: Not on file   Highest education level: Not on file  Occupational History   Not on file  Tobacco Use   Smoking status: Never   Smokeless tobacco: Never  Vaping Use   Vaping Use: Never used  Substance and Sexual Activity   Alcohol use: Yes    Comment: occ   Drug use: No   Sexual activity: Not on file  Other Topics Concern   Not on file  Social History Narrative    Not on file   Social Determinants of Health   Financial Resource Strain: Not on file  Food Insecurity: Not on file  Transportation Needs: Not on file  Physical Activity: Not on file  Stress: Not on file  Social Connections: Not on file  Intimate Partner Violence: Not on file    Current Outpatient Medications on File Prior to Visit  Medication Sig Dispense Refill   blood glucose meter kit and supplies KIT Dispense based on patient and insurance preference. Use up to four times daily as directed. 1 each 0   Insulin Pen Needle (PEN NEEDLES 31GX5/16") 31G X 8 MM MISC 1 each by Does not apply route 4 (four) times daily. 100 each 1   Lactobacillus (PROBIOTIC ACIDOPHILUS PO) Take 1 capsule by mouth daily.     loperamide (IMODIUM) 2 MG capsule Take 1 capsule (2 mg total) by mouth 4 (four) times daily as needed for diarrhea or loose stools. 12 capsule 0   naproxen (NAPROSYN) 250 MG tablet Take 1,000 mg by mouth daily as needed for mild pain.     [DISCONTINUED] cholestyramine light (PREVALITE) 4 g packet Take 1 packet (4 g total) by mouth 2 (two) times daily. (Patient not taking: Reported on 05/22/2020) 60 packet 3   [DISCONTINUED] DULoxetine (CYMBALTA) 30 MG capsule Take 2 capsules (60 mg total) by mouth daily.  Take 1 tablet once a day for 7 days and then increase to 2 tablets daily. (Patient not taking: Reported on 05/22/2020) 60 capsule 3   [DISCONTINUED] enalapril (VASOTEC) 2.5 MG tablet Take 1 tablet (2.5 mg total) by mouth daily. (Patient not taking: Reported on 11/21/2018) 30 tablet 3   [DISCONTINUED] omeprazole (PRILOSEC) 20 MG capsule Take 1 capsule (20 mg total) by mouth daily. (Patient not taking: Reported on 05/22/2020) 30 capsule 3   No current facility-administered medications on file prior to visit.    Allergies  Allergen Reactions   Penicillins Anaphylaxis and Hives   Amoxicillin Itching   Penicillin G Itching    Family History  Problem Relation Age of Onset   Diabetes Mother     Hypertension Mother    Heart disease Mother    Diabetes Father    Arthritis Father    Asthma Father    Hypertension Father    Heart disease Father    Kidney disease Father    Diabetes Sister    Heart disease Paternal Grandfather    Diabetes Paternal Grandfather     BP 120/84 (BP Location: Right Arm, Patient Position: Sitting, Cuff Size: Normal)   Pulse (!) 104   Ht _0  (1.803 m)   Wt 211 lb 3.2 oz (95.8 kg)   SpO2 96%   BMI 29.46 kg/m    Review of Systems He denies hypoglycemia.      Objective:   Physical Exam Pulses: dorsalis pedis intact bilat.   MSK: no deformity of the feet CV: no leg edema Skin:  shallow ulcers on both great toes.  normal color and temp on the feet.  Neuro: sensation is intact to touch on the feet  Lab Results  Component Value Date   HGBA1C 12.0 (A) 07/13/2021       Assessment & Plan:  Intermittently ketosis-prone DM: I have sent a prescription to your pharmacy.  Foot ulcers, recurrent  Patient Instructions  Please schedule an appointment with a new primary care provider at the [place] [facility].  Please see a wound specialist.  you will receive a phone call, about a day and time for an appointment.  I have sent a prescription to your pharmacy, to increase the Lantus to 40 units each morning.   Please reduce Novolog to 8 units 3 times a day (just before each meal).   check your blood sugar 4 times a day: before the 3 meals, and at bedtime.  also check if you have symptoms of your blood sugar being too high or too low.  please keep a record of the readings and bring it to your next appointment here (or you can bring the meter itself).  You can write it on any piece of paper.  please call us sooner if your blood sugar goes below 70, or if most of your readings are over 200.   Please see a diabetes educator, to consider an insulin pump.   Please come back for a follow-up appointment in 6 weeks.

## 2021-10-02 ENCOUNTER — Ambulatory Visit: Payer: 59 | Admitting: Endocrinology
